# Patient Record
Sex: Male | Born: 1970 | Race: White | Hispanic: No | Marital: Single | Smoking: Former smoker
Health system: Southern US, Community
[De-identification: ages and names within clinical notes are randomized; demographics above are authoritative.]

## PROBLEM LIST (undated history)

## (undated) DIAGNOSIS — E119 Type 2 diabetes mellitus without complications: Secondary | ICD-10-CM

## (undated) DIAGNOSIS — C9140 Hairy cell leukemia not having achieved remission: Secondary | ICD-10-CM

## (undated) DIAGNOSIS — K5792 Diverticulitis of intestine, part unspecified, without perforation or abscess without bleeding: Secondary | ICD-10-CM

## (undated) DIAGNOSIS — I10 Essential (primary) hypertension: Secondary | ICD-10-CM

## (undated) HISTORY — DX: Essential (primary) hypertension: I10

## (undated) HISTORY — DX: Hairy cell leukemia not having achieved remission: C91.40

## (undated) HISTORY — DX: Type 2 diabetes mellitus without complications: E11.9

## (undated) HISTORY — PX: COLON SURGERY: SHX602

---

## 2006-02-11 ENCOUNTER — Ambulatory Visit: Payer: Self-pay | Admitting: Urology

## 2007-06-06 ENCOUNTER — Emergency Department: Payer: Self-pay | Admitting: Emergency Medicine

## 2007-06-06 ENCOUNTER — Other Ambulatory Visit: Payer: Self-pay

## 2008-12-22 ENCOUNTER — Ambulatory Visit: Payer: Self-pay | Admitting: Gastroenterology

## 2009-01-05 ENCOUNTER — Ambulatory Visit: Payer: Self-pay | Admitting: Gastroenterology

## 2009-03-01 ENCOUNTER — Emergency Department: Payer: Self-pay | Admitting: Emergency Medicine

## 2009-03-22 ENCOUNTER — Ambulatory Visit: Payer: Self-pay

## 2009-05-05 ENCOUNTER — Ambulatory Visit: Payer: Self-pay | Admitting: Gastroenterology

## 2009-05-12 ENCOUNTER — Ambulatory Visit: Payer: Self-pay | Admitting: Unknown Physician Specialty

## 2009-05-13 ENCOUNTER — Ambulatory Visit: Payer: Self-pay | Admitting: Unknown Physician Specialty

## 2009-05-25 ENCOUNTER — Ambulatory Visit: Payer: Self-pay | Admitting: Pain Medicine

## 2010-08-02 ENCOUNTER — Ambulatory Visit: Payer: Self-pay | Admitting: Internal Medicine

## 2011-08-30 ENCOUNTER — Emergency Department: Payer: Self-pay | Admitting: Emergency Medicine

## 2011-08-30 LAB — CBC
HCT: 49.5 % (ref 40.0–52.0)
HGB: 16.4 g/dL (ref 13.0–18.0)
MCH: 28.8 pg (ref 26.0–34.0)
MCV: 87 fL (ref 80–100)
Platelet: 262 10*3/uL (ref 150–440)
RBC: 5.67 10*6/uL (ref 4.40–5.90)
RDW: 15.1 % — ABNORMAL HIGH (ref 11.5–14.5)
WBC: 17.3 10*3/uL — ABNORMAL HIGH (ref 3.8–10.6)

## 2011-08-30 LAB — COMPREHENSIVE METABOLIC PANEL
Calcium, Total: 9 mg/dL (ref 8.5–10.1)
Chloride: 99 mmol/L (ref 98–107)
Co2: 25 mmol/L (ref 21–32)
EGFR (African American): >60
EGFR (Non-African Amer.): >60
Glucose: 165 mg/dL — ABNORMAL HIGH (ref 65–99)
Osmolality: 274 (ref 275–301)
Potassium: 2.9 mmol/L — ABNORMAL LOW (ref 3.5–5.1)
SGOT(AST): 12 U/L — ABNORMAL LOW (ref 15–37)
Total Protein: 8.1 g/dL (ref 6.4–8.2)

## 2011-08-30 LAB — URINALYSIS, COMPLETE
Bilirubin,UR: NEGATIVE
Glucose,UR: NEGATIVE mg/dL (ref 0–75)
Hyaline Cast: 3
Ketone: NEGATIVE
Leukocyte Esterase: NEGATIVE
Protein: 100
WBC UR: 5 /HPF (ref 0–5)

## 2012-06-27 ENCOUNTER — Ambulatory Visit: Payer: Self-pay | Admitting: Gastroenterology

## 2013-10-09 ENCOUNTER — Inpatient Hospital Stay: Payer: Self-pay | Admitting: Surgery

## 2013-10-09 LAB — COMPREHENSIVE METABOLIC PANEL
ALBUMIN: 3.2 g/dL — AB (ref 3.4–5.0)
ALK PHOS: 106 U/L
Anion Gap: 8 (ref 7–16)
BILIRUBIN TOTAL: 0.7 mg/dL (ref 0.2–1.0)
BUN: 7 mg/dL (ref 7–18)
CALCIUM: 9.4 mg/dL (ref 8.5–10.1)
CHLORIDE: 98 mmol/L (ref 98–107)
Co2: 29 mmol/L (ref 21–32)
Creatinine: 1.02 mg/dL (ref 0.60–1.30)
GLUCOSE: 115 mg/dL — AB (ref 65–99)
Osmolality: 269 (ref 275–301)
POTASSIUM: 3.2 mmol/L — AB (ref 3.5–5.1)
SGOT(AST): 13 U/L — ABNORMAL LOW (ref 15–37)
SGPT (ALT): 28 U/L
Sodium: 135 mmol/L — ABNORMAL LOW (ref 136–145)
Total Protein: 8.5 g/dL — ABNORMAL HIGH (ref 6.4–8.2)

## 2013-10-09 LAB — URINALYSIS, COMPLETE
BACTERIA: NONE SEEN
BILIRUBIN, UR: NEGATIVE
Glucose,UR: NEGATIVE mg/dL (ref 0–75)
Ketone: NEGATIVE
LEUKOCYTE ESTERASE: NEGATIVE
Nitrite: NEGATIVE
PH: 6 (ref 4.5–8.0)
Protein: NEGATIVE
RBC,UR: <1 /HPF (ref 0–5)
SQUAMOUS EPITHELIAL: NONE SEEN
Specific Gravity: 1.006 (ref 1.003–1.030)

## 2013-10-09 LAB — CBC WITH DIFFERENTIAL/PLATELET
Basophil #: 0.1 10*3/uL (ref 0.0–0.1)
Basophil %: 0.4 %
EOS ABS: 0.2 10*3/uL (ref 0.0–0.7)
Eosinophil %: 1.1 %
HCT: 55.6 % — ABNORMAL HIGH (ref 40.0–52.0)
HGB: 18.3 g/dL — AB (ref 13.0–18.0)
LYMPHS PCT: 9.3 %
Lymphocyte #: 1.6 10*3/uL (ref 1.0–3.6)
MCH: 29.5 pg (ref 26.0–34.0)
MCHC: 32.9 g/dL (ref 32.0–36.0)
MCV: 90 fL (ref 80–100)
Monocyte #: 1.9 x10 3/mm — ABNORMAL HIGH (ref 0.2–1.0)
Monocyte %: 11.3 %
NEUTROS ABS: 13.2 10*3/uL — AB (ref 1.4–6.5)
NEUTROS PCT: 77.9 %
Platelet: 296 10*3/uL (ref 150–440)
RBC: 6.2 10*6/uL — ABNORMAL HIGH (ref 4.40–5.90)
RDW: 13.8 % (ref 11.5–14.5)
WBC: 17 10*3/uL — AB (ref 3.8–10.6)

## 2013-10-09 LAB — PROTIME-INR
INR: 1.2
Prothrombin Time: 15.1 secs — ABNORMAL HIGH (ref 11.5–14.7)

## 2013-10-10 LAB — BASIC METABOLIC PANEL
Anion Gap: 5 — ABNORMAL LOW (ref 7–16)
BUN: 6 mg/dL — ABNORMAL LOW (ref 7–18)
CALCIUM: 8.7 mg/dL (ref 8.5–10.1)
Chloride: 100 mmol/L (ref 98–107)
Co2: 33 mmol/L — ABNORMAL HIGH (ref 21–32)
Creatinine: 1.07 mg/dL (ref 0.60–1.30)
EGFR (African American): >60
EGFR (Non-African Amer.): >60
Glucose: 109 mg/dL — ABNORMAL HIGH (ref 65–99)
OSMOLALITY: 274 (ref 275–301)
POTASSIUM: 3.7 mmol/L (ref 3.5–5.1)
SODIUM: 138 mmol/L (ref 136–145)

## 2013-10-10 LAB — CBC WITH DIFFERENTIAL/PLATELET
Basophil #: 0.2 10*3/uL — ABNORMAL HIGH (ref 0.0–0.1)
Basophil %: 0.9 %
EOS ABS: 0.4 10*3/uL (ref 0.0–0.7)
Eosinophil %: 2.3 %
HCT: 46.4 % (ref 40.0–52.0)
HGB: 15.3 g/dL (ref 13.0–18.0)
Lymphocyte #: 2.5 10*3/uL (ref 1.0–3.6)
Lymphocyte %: 14.9 %
MCH: 29.2 pg (ref 26.0–34.0)
MCHC: 33 g/dL (ref 32.0–36.0)
MCV: 89 fL (ref 80–100)
Monocyte #: 1.9 x10 3/mm — ABNORMAL HIGH (ref 0.2–1.0)
Monocyte %: 11.6 %
NEUTROS PCT: 70.3 %
Neutrophil #: 11.8 10*3/uL — ABNORMAL HIGH (ref 1.4–6.5)
PLATELETS: 274 10*3/uL (ref 150–440)
RBC: 5.23 10*6/uL (ref 4.40–5.90)
RDW: 13.6 % (ref 11.5–14.5)
WBC: 16.7 10*3/uL — ABNORMAL HIGH (ref 3.8–10.6)

## 2013-10-10 LAB — URINE CULTURE

## 2013-10-11 LAB — BASIC METABOLIC PANEL
ANION GAP: 5 — AB (ref 7–16)
BUN: 6 mg/dL — ABNORMAL LOW (ref 7–18)
CALCIUM: 9.2 mg/dL (ref 8.5–10.1)
CHLORIDE: 98 mmol/L (ref 98–107)
CO2: 30 mmol/L (ref 21–32)
Creatinine: 0.77 mg/dL (ref 0.60–1.30)
EGFR (African American): >60
EGFR (Non-African Amer.): >60
Glucose: 164 mg/dL — ABNORMAL HIGH (ref 65–99)
OSMOLALITY: 268 (ref 275–301)
POTASSIUM: 4.6 mmol/L (ref 3.5–5.1)
Sodium: 133 mmol/L — ABNORMAL LOW (ref 136–145)

## 2013-10-11 LAB — CBC WITH DIFFERENTIAL/PLATELET
BASOS ABS: 0.1 10*3/uL (ref 0.0–0.1)
Basophil %: 0.3 %
EOS PCT: 0.2 %
Eosinophil #: 0 10*3/uL (ref 0.0–0.7)
HCT: 47.3 % (ref 40.0–52.0)
HGB: 15.4 g/dL (ref 13.0–18.0)
Lymphocyte #: 1 10*3/uL (ref 1.0–3.6)
Lymphocyte %: 4.1 %
MCH: 29 pg (ref 26.0–34.0)
MCHC: 32.6 g/dL (ref 32.0–36.0)
MCV: 89 fL (ref 80–100)
MONOS PCT: 9.5 %
Monocyte #: 2.4 x10 3/mm — ABNORMAL HIGH (ref 0.2–1.0)
NEUTROS ABS: 21.7 10*3/uL — AB (ref 1.4–6.5)
Neutrophil %: 85.9 %
PLATELETS: 349 10*3/uL (ref 150–440)
RBC: 5.32 10*6/uL (ref 4.40–5.90)
RDW: 13.4 % (ref 11.5–14.5)
WBC: 25.3 10*3/uL — ABNORMAL HIGH (ref 3.8–10.6)

## 2013-10-14 LAB — CULTURE, BLOOD (SINGLE)

## 2013-10-14 LAB — PATHOLOGY REPORT

## 2013-10-15 LAB — CREATININE, SERUM
Creatinine: 0.93 mg/dL (ref 0.60–1.30)
EGFR (African American): >60

## 2013-10-15 LAB — WOUND CULTURE

## 2014-02-03 ENCOUNTER — Ambulatory Visit: Payer: Self-pay | Admitting: Surgery

## 2014-02-08 ENCOUNTER — Ambulatory Visit: Payer: Self-pay | Admitting: Surgery

## 2014-04-02 ENCOUNTER — Inpatient Hospital Stay: Payer: Self-pay | Admitting: Surgery

## 2014-04-02 LAB — CREATININE, SERUM
Creatinine: 1.27 mg/dL (ref 0.60–1.30)
EGFR (African American): >60
EGFR (Non-African Amer.): >60

## 2014-04-03 LAB — CBC WITH DIFFERENTIAL/PLATELET
Basophil #: 0.1 10*3/uL (ref 0.0–0.1)
Basophil %: 0.2 %
Eosinophil #: 0 10*3/uL (ref 0.0–0.7)
Eosinophil %: 0 %
HCT: 58.9 % — ABNORMAL HIGH (ref 40.0–52.0)
HGB: 19.2 g/dL — ABNORMAL HIGH (ref 13.0–18.0)
Lymphocyte #: 1.3 10*3/uL (ref 1.0–3.6)
Lymphocyte %: 4.7 %
MCH: 28.4 pg (ref 26.0–34.0)
MCHC: 32.5 g/dL (ref 32.0–36.0)
MCV: 87 fL (ref 80–100)
MONOS PCT: 9.3 %
Monocyte #: 2.5 x10 3/mm — ABNORMAL HIGH (ref 0.2–1.0)
NEUTROS PCT: 85.8 %
Neutrophil #: 23.3 10*3/uL — ABNORMAL HIGH (ref 1.4–6.5)
PLATELETS: 220 10*3/uL (ref 150–440)
RBC: 6.75 10*6/uL — ABNORMAL HIGH (ref 4.40–5.90)
RDW: 14.8 % — ABNORMAL HIGH (ref 11.5–14.5)
WBC: 27.1 10*3/uL — AB (ref 3.8–10.6)

## 2014-04-03 LAB — BASIC METABOLIC PANEL
Anion Gap: 7 (ref 7–16)
BUN: 10 mg/dL (ref 7–18)
CREATININE: 0.95 mg/dL (ref 0.60–1.30)
Calcium, Total: 8.8 mg/dL (ref 8.5–10.1)
Chloride: 103 mmol/L (ref 98–107)
Co2: 27 mmol/L (ref 21–32)
EGFR (Non-African Amer.): >60
Glucose: 160 mg/dL — ABNORMAL HIGH (ref 65–99)
OSMOLALITY: 276 (ref 275–301)
Potassium: 4.2 mmol/L (ref 3.5–5.1)
Sodium: 137 mmol/L (ref 136–145)

## 2014-04-04 LAB — CBC WITH DIFFERENTIAL/PLATELET
BASOS ABS: 0.1 10*3/uL (ref 0.0–0.1)
Basophil %: 0.2 %
Eosinophil #: 0.1 10*3/uL (ref 0.0–0.7)
Eosinophil %: 0.4 %
HCT: 55.3 % — ABNORMAL HIGH (ref 40.0–52.0)
HGB: 17.6 g/dL (ref 13.0–18.0)
LYMPHS PCT: 6.5 %
Lymphocyte #: 1.5 10*3/uL (ref 1.0–3.6)
MCH: 28.2 pg (ref 26.0–34.0)
MCHC: 31.8 g/dL — AB (ref 32.0–36.0)
MCV: 89 fL (ref 80–100)
Monocyte #: 2.4 x10 3/mm — ABNORMAL HIGH (ref 0.2–1.0)
Monocyte %: 10.4 %
Neutrophil #: 19.5 10*3/uL — ABNORMAL HIGH (ref 1.4–6.5)
Neutrophil %: 82.5 %
Platelet: 174 10*3/uL (ref 150–440)
RBC: 6.24 10*6/uL — ABNORMAL HIGH (ref 4.40–5.90)
RDW: 14.9 % — ABNORMAL HIGH (ref 11.5–14.5)
WBC: 23.6 10*3/uL — ABNORMAL HIGH (ref 3.8–10.6)

## 2014-04-04 LAB — BASIC METABOLIC PANEL
Anion Gap: 7 (ref 7–16)
BUN: 10 mg/dL (ref 7–18)
CALCIUM: 8.7 mg/dL (ref 8.5–10.1)
Chloride: 101 mmol/L (ref 98–107)
Co2: 28 mmol/L (ref 21–32)
Creatinine: 0.77 mg/dL (ref 0.60–1.30)
EGFR (Non-African Amer.): >60
GLUCOSE: 145 mg/dL — AB (ref 65–99)
OSMOLALITY: 274 (ref 275–301)
POTASSIUM: 3.9 mmol/L (ref 3.5–5.1)
Sodium: 136 mmol/L (ref 136–145)

## 2014-04-05 LAB — BASIC METABOLIC PANEL
Anion Gap: 7 (ref 7–16)
BUN: 11 mg/dL (ref 7–18)
CO2: 29 mmol/L (ref 21–32)
Calcium, Total: 8.6 mg/dL (ref 8.5–10.1)
Chloride: 99 mmol/L (ref 98–107)
Creatinine: 0.83 mg/dL (ref 0.60–1.30)
EGFR (African American): >60
EGFR (Non-African Amer.): >60
Glucose: 119 mg/dL — ABNORMAL HIGH (ref 65–99)
Osmolality: 271 (ref 275–301)
Potassium: 3.7 mmol/L (ref 3.5–5.1)
SODIUM: 135 mmol/L — AB (ref 136–145)

## 2014-04-05 LAB — CBC WITH DIFFERENTIAL/PLATELET
BASOS PCT: 0.5 %
Basophil #: 0.1 10*3/uL (ref 0.0–0.1)
EOS ABS: 0.4 10*3/uL (ref 0.0–0.7)
EOS PCT: 2 %
HCT: 53.6 % — ABNORMAL HIGH (ref 40.0–52.0)
HGB: 17.5 g/dL (ref 13.0–18.0)
Lymphocyte #: 2.3 10*3/uL (ref 1.0–3.6)
Lymphocyte %: 11.7 %
MCH: 28.6 pg (ref 26.0–34.0)
MCHC: 32.6 g/dL (ref 32.0–36.0)
MCV: 88 fL (ref 80–100)
Monocyte #: 2.1 x10 3/mm — ABNORMAL HIGH (ref 0.2–1.0)
Monocyte %: 10.8 %
NEUTROS PCT: 75 %
Neutrophil #: 14.7 10*3/uL — ABNORMAL HIGH (ref 1.4–6.5)
Platelet: 195 10*3/uL (ref 150–440)
RBC: 6.11 10*6/uL — AB (ref 4.40–5.90)
RDW: 15 % — ABNORMAL HIGH (ref 11.5–14.5)
WBC: 19.6 10*3/uL — ABNORMAL HIGH (ref 3.8–10.6)

## 2014-04-06 LAB — BASIC METABOLIC PANEL
ANION GAP: 6 — AB (ref 7–16)
BUN: 14 mg/dL (ref 7–18)
CALCIUM: 8.5 mg/dL (ref 8.5–10.1)
CO2: 30 mmol/L (ref 21–32)
Chloride: 97 mmol/L — ABNORMAL LOW (ref 98–107)
Creatinine: 0.95 mg/dL (ref 0.60–1.30)
EGFR (Non-African Amer.): >60
Glucose: 111 mg/dL — ABNORMAL HIGH (ref 65–99)
Osmolality: 268 (ref 275–301)
Potassium: 3.6 mmol/L (ref 3.5–5.1)
Sodium: 133 mmol/L — ABNORMAL LOW (ref 136–145)

## 2014-04-06 LAB — CBC WITH DIFFERENTIAL/PLATELET
BASOS ABS: 0.1 10*3/uL (ref 0.0–0.1)
Basophil %: 0.7 %
Eosinophil #: 0.6 10*3/uL (ref 0.0–0.7)
Eosinophil %: 4.5 %
HCT: 52.9 % — ABNORMAL HIGH (ref 40.0–52.0)
HGB: 17.2 g/dL (ref 13.0–18.0)
LYMPHS ABS: 2 10*3/uL (ref 1.0–3.6)
Lymphocyte %: 14.9 %
MCH: 28.8 pg (ref 26.0–34.0)
MCHC: 32.5 g/dL (ref 32.0–36.0)
MCV: 89 fL (ref 80–100)
MONO ABS: 1.5 x10 3/mm — AB (ref 0.2–1.0)
MONOS PCT: 11.4 %
Neutrophil #: 9 10*3/uL — ABNORMAL HIGH (ref 1.4–6.5)
Neutrophil %: 68.5 %
Platelet: 214 10*3/uL (ref 150–440)
RBC: 5.96 10*6/uL — ABNORMAL HIGH (ref 4.40–5.90)
RDW: 14.7 % — ABNORMAL HIGH (ref 11.5–14.5)
WBC: 13.2 10*3/uL — ABNORMAL HIGH (ref 3.8–10.6)

## 2014-05-12 ENCOUNTER — Inpatient Hospital Stay: Payer: Self-pay | Admitting: Surgery

## 2014-07-04 ENCOUNTER — Inpatient Hospital Stay
Admit: 2014-07-04 | Disposition: A | Discharge status: Home / Self Care | Payer: Self-pay | Attending: Surgery | Admitting: Surgery

## 2014-07-04 LAB — CBC
HCT: 56.1 % — ABNORMAL HIGH (ref 40.0–52.0)
HGB: 18.5 g/dL — ABNORMAL HIGH (ref 13.0–18.0)
MCH: 29.7 pg (ref 26.0–34.0)
MCHC: 33 g/dL (ref 32.0–36.0)
MCV: 90 fL (ref 80–100)
Platelet: 259 10*3/uL (ref 150–440)
RBC: 6.22 10*6/uL — AB (ref 4.40–5.90)
RDW: 15.5 % — ABNORMAL HIGH (ref 11.5–14.5)
WBC: 13.3 10*3/uL — AB (ref 3.8–10.6)

## 2014-07-04 LAB — COMPREHENSIVE METABOLIC PANEL
ALT: 26 U/L
ANION GAP: 10 (ref 7–16)
Albumin: 4.2 g/dL
Alkaline Phosphatase: 73 U/L
BUN: 11 mg/dL
Bilirubin,Total: 0.5 mg/dL
CO2: 28 mmol/L
Calcium, Total: 9.3 mg/dL
Chloride: 102 mmol/L
Creatinine: 0.91 mg/dL
EGFR (African American): >60
Glucose: 132 mg/dL — ABNORMAL HIGH
Potassium: 3.8 mmol/L
SGOT(AST): 28 U/L
SODIUM: 140 mmol/L
TOTAL PROTEIN: 7.7 g/dL

## 2014-07-05 LAB — CBC WITH DIFFERENTIAL/PLATELET
BASOS ABS: 0 10*3/uL (ref 0.0–0.1)
BASOS PCT: 0.1 %
EOS ABS: 0 10*3/uL (ref 0.0–0.7)
Eosinophil %: 0.1 %
HCT: 59 % — ABNORMAL HIGH (ref 40.0–52.0)
HGB: 18.9 g/dL — AB (ref 13.0–18.0)
LYMPHS PCT: 4.3 %
Lymphocyte #: 0.6 10*3/uL — ABNORMAL LOW (ref 1.0–3.6)
MCH: 29.3 pg (ref 26.0–34.0)
MCHC: 32 g/dL (ref 32.0–36.0)
MCV: 92 fL (ref 80–100)
MONOS PCT: 4.4 %
Monocyte #: 0.7 x10 3/mm (ref 0.2–1.0)
Neutrophil #: 13.7 10*3/uL — ABNORMAL HIGH (ref 1.4–6.5)
Neutrophil %: 91.1 %
Platelet: 234 10*3/uL (ref 150–440)
RBC: 6.45 10*6/uL — ABNORMAL HIGH (ref 4.40–5.90)
RDW: 15.9 % — ABNORMAL HIGH (ref 11.5–14.5)
WBC: 15.1 10*3/uL — ABNORMAL HIGH (ref 3.8–10.6)

## 2014-07-05 LAB — URINALYSIS, COMPLETE
BILIRUBIN, UR: NEGATIVE
BLOOD: NEGATIVE
Bacteria: NONE SEEN
Glucose,UR: NEGATIVE mg/dL (ref 0–75)
Ketone: NEGATIVE
Leukocyte Esterase: NEGATIVE
NITRITE: NEGATIVE
PROTEIN: NEGATIVE
Ph: 5 (ref 4.5–8.0)
SPECIFIC GRAVITY: 1.047 (ref 1.003–1.030)
Squamous Epithelial: NONE SEEN

## 2014-07-05 LAB — BASIC METABOLIC PANEL
Anion Gap: 10 (ref 7–16)
BUN: 12 mg/dL
CHLORIDE: 105 mmol/L
CO2: 24 mmol/L
Calcium, Total: 8.3 mg/dL — ABNORMAL LOW
Creatinine: 1.07 mg/dL
Glucose: 172 mg/dL — ABNORMAL HIGH
POTASSIUM: 4.3 mmol/L
Sodium: 139 mmol/L

## 2014-07-06 LAB — BASIC METABOLIC PANEL
Anion Gap: 8 (ref 7–16)
BUN: 20 mg/dL
CHLORIDE: 105 mmol/L
CREATININE: 0.99 mg/dL
Calcium, Total: 8.1 mg/dL — ABNORMAL LOW
Co2: 24 mmol/L
EGFR (Non-African Amer.): >60
Glucose: 151 mg/dL — ABNORMAL HIGH
Potassium: 4.3 mmol/L
Sodium: 137 mmol/L

## 2014-07-06 LAB — CBC WITH DIFFERENTIAL/PLATELET
Basophil #: 0.1 10*3/uL (ref 0.0–0.1)
Basophil %: 0.2 %
EOS ABS: 0 10*3/uL (ref 0.0–0.7)
Eosinophil %: 0 %
HCT: 54.9 % — ABNORMAL HIGH (ref 40.0–52.0)
HGB: 18 g/dL (ref 13.0–18.0)
LYMPHS ABS: 0.7 10*3/uL — AB (ref 1.0–3.6)
Lymphocyte %: 2.9 %
MCH: 29.9 pg (ref 26.0–34.0)
MCHC: 32.8 g/dL (ref 32.0–36.0)
MCV: 91 fL (ref 80–100)
MONOS PCT: 5 %
Monocyte #: 1.2 x10 3/mm — ABNORMAL HIGH (ref 0.2–1.0)
Neutrophil #: 21.7 10*3/uL — ABNORMAL HIGH (ref 1.4–6.5)
Neutrophil %: 91.9 %
PLATELETS: 210 10*3/uL (ref 150–440)
RBC: 6.01 10*6/uL — ABNORMAL HIGH (ref 4.40–5.90)
RDW: 15.8 % — AB (ref 11.5–14.5)
WBC: 23.7 10*3/uL — ABNORMAL HIGH (ref 3.8–10.6)

## 2014-07-07 LAB — CBC WITH DIFFERENTIAL/PLATELET
BASOS ABS: 0 10*3/uL (ref 0.0–0.1)
Basophil %: 0 %
EOS ABS: 0 10*3/uL (ref 0.0–0.7)
Eosinophil %: 0 %
HCT: 44 % (ref 40.0–52.0)
HGB: 14 g/dL (ref 13.0–18.0)
LYMPHS ABS: 0.4 10*3/uL — AB (ref 1.0–3.6)
LYMPHS PCT: 2.5 %
MCH: 29.5 pg (ref 26.0–34.0)
MCHC: 32 g/dL (ref 32.0–36.0)
MCV: 92 fL (ref 80–100)
Monocyte #: 0.8 x10 3/mm (ref 0.2–1.0)
Monocyte %: 4.2 %
Neutrophil #: 16.9 10*3/uL — ABNORMAL HIGH (ref 1.4–6.5)
Neutrophil %: 93.3 %
Platelet: 179 10*3/uL (ref 150–440)
RBC: 4.77 10*6/uL (ref 4.40–5.90)
RDW: 15.3 % — AB (ref 11.5–14.5)
WBC: 18.1 10*3/uL — ABNORMAL HIGH (ref 3.8–10.6)

## 2014-07-07 LAB — BASIC METABOLIC PANEL
Anion Gap: 2 — ABNORMAL LOW (ref 7–16)
BUN: 17 mg/dL
CALCIUM: 7.4 mg/dL — AB
CO2: 28 mmol/L
CREATININE: 1.03 mg/dL
Chloride: 106 mmol/L
EGFR (African American): >60
EGFR (Non-African Amer.): >60
GLUCOSE: 162 mg/dL — AB
Potassium: 4.4 mmol/L
Sodium: 136 mmol/L

## 2014-07-08 LAB — TPN PANEL
Activated PTT: 26 secs (ref 23.6–35.9)
Albumin: 2.4 g/dL — ABNORMAL LOW
Alkaline Phosphatase: 63 U/L
Anion Gap: 4 — ABNORMAL LOW (ref 7–16)
BUN: 16 mg/dL
CALCIUM: 7.9 mg/dL — AB
Chloride: 102 mmol/L
Cholesterol: 163 mg/dL
Co2: 32 mmol/L
Creatinine: 0.78 mg/dL
EGFR (African American): >60
EGFR (Non-African Amer.): >60
Glucose: 143 mg/dL — ABNORMAL HIGH
HGB: 13.9 g/dL (ref 13.0–18.0)
INR: 1.1
MAGNESIUM: 2.3 mg/dL
PROTHROMBIN TIME: 14 s
Phosphorus: 1.4 mg/dL — ABNORMAL LOW
Platelet: 190 10*3/uL (ref 150–440)
Potassium: 3.9 mmol/L
SGOT(AST): 14 U/L — ABNORMAL LOW
Sodium: 138 mmol/L
Total Protein: 6.1 g/dL — ABNORMAL LOW
Triglycerides: 191 mg/dL — ABNORMAL HIGH
WBC: 19.4 10*3/uL — ABNORMAL HIGH (ref 3.8–10.6)

## 2014-07-09 LAB — PHOSPHORUS: Phosphorus: 2.3 mg/dL — ABNORMAL LOW

## 2014-07-09 LAB — BASIC METABOLIC PANEL
Anion Gap: 6 — ABNORMAL LOW (ref 7–16)
BUN: 15 mg/dL
CHLORIDE: 101 mmol/L
CREATININE: 0.62 mg/dL
Calcium, Total: 8.2 mg/dL — ABNORMAL LOW
Co2: 30 mmol/L
EGFR (African American): >60
EGFR (Non-African Amer.): >60
GLUCOSE: 113 mg/dL — AB
Potassium: 3.7 mmol/L
Sodium: 137 mmol/L

## 2014-07-09 LAB — MAGNESIUM: Magnesium: 2.3 mg/dL

## 2014-07-10 LAB — CBC WITH DIFFERENTIAL/PLATELET
BASOS ABS: 0.1 10*3/uL (ref 0.0–0.1)
BASOS PCT: 0.3 %
EOS ABS: 0.1 10*3/uL (ref 0.0–0.7)
EOS PCT: 0.7 %
HCT: 49.8 % (ref 40.0–52.0)
HGB: 16.6 g/dL (ref 13.0–18.0)
LYMPHS PCT: 6.2 %
Lymphocyte #: 1.2 10*3/uL (ref 1.0–3.6)
MCH: 29.9 pg (ref 26.0–34.0)
MCHC: 33.3 g/dL (ref 32.0–36.0)
MCV: 90 fL (ref 80–100)
MONO ABS: 2 x10 3/mm — AB (ref 0.2–1.0)
Monocyte %: 10 %
NEUTROS ABS: 16.5 10*3/uL — AB (ref 1.4–6.5)
NEUTROS PCT: 82.8 %
Platelet: 224 10*3/uL (ref 150–440)
RBC: 5.56 10*6/uL (ref 4.40–5.90)
RDW: 15.1 % — ABNORMAL HIGH (ref 11.5–14.5)
WBC: 19.9 10*3/uL — ABNORMAL HIGH (ref 3.8–10.6)

## 2014-07-10 LAB — BASIC METABOLIC PANEL
Anion Gap: 10 (ref 7–16)
BUN: 17 mg/dL
CALCIUM: 8.4 mg/dL — AB
CHLORIDE: 96 mmol/L — AB
Co2: 29 mmol/L
Creatinine: 0.59 mg/dL — ABNORMAL LOW
EGFR (Non-African Amer.): >60
Glucose: 128 mg/dL — ABNORMAL HIGH
Potassium: 3.6 mmol/L
Sodium: 135 mmol/L

## 2014-07-10 LAB — MAGNESIUM: MAGNESIUM: 2.3 mg/dL

## 2014-07-10 LAB — PHOSPHORUS: Phosphorus: 3.3 mg/dL

## 2014-07-10 NOTE — Op Note (Signed)
PATIENT NAME:  Roy Valentine, Roy Valentine MR#:  [id number] DATE OF BIRTH:  07-12-1970  DATE OF PROCEDURE:  10/10/2013  PREOPERATIVE DIAGNOSIS: Perforated diverticulitis with pelvic abscess.   POSTOPERATIVE DIAGNOSIS: Perforated diverticulitis with abdominal abscess.   SURGERY: Exploratory laparotomy,  Hartmann procedure, and central line placement.   ANESTHESIA: General.   SURGEON: Micheline Maze, M.D.    OPERATIVE PROCEDURE: With the patient in the supine position, and after induction of appropriate general anesthesia and placement of a Foley catheter, the patient's abdomen was prepped with ChloraPrep and draped with sterile towels. An alcohol wipe and Betadine impregnated Steri-Drapes were utilized. A midline incision was made from just above the umbilicus to the suprapubic area and carried down through the subcutaneous tissue with Bovie electrocautery. The midline fascia was identified and this was opened the length of the skin incision, as was the peritoneum. There was a large, firm mass in the lower midline where the omentum was sealed to the dome of the bladder. This area was taken down with a combination of blunt and Bovie dissection. A large amount of purulence was identified and suctioned without difficulty. It was cultured. The omentum was slowly peeled back, as were multiple loops of small bowel. There did not appear to be any small bowel involvement, although the small bowel was markedly edematous and adherent to the abscessed mass. The small bowel was then packed out of the pelvis. The sigmoid colon was markedly edematous, inflamed, and obviously involved in an acute process suspected to be diverticulitis. There did appear to be some distal sigmoid, which appeared to be normal. It was divided with the Contour stapling device. The mesentery taken down with the LigaSure staying close to the bowel wall. The splenic flexure was mobilized to the mid transverse colon using the LigaSure device in  order to provide adequate length, as there did appear to be almost the entirety of the sigmoid colon involved in the diverticular process. The bowel was divided proximally with the Contour device and the specimen passed off the table.   The area was copiously irrigated. Warm saline solution was used to 2 liters. The bowel contents were then examined. There did not appear to be any evidence of small bowel problem other than some edema from the abscess formation from the ligament of Treitz to the ileocecal valve. A spot was chosen on the anterior abdominal wall for the colostomy. The skin was incised in a circular fashion. A cruciate incision made in both the peritoneum and the anterior and posterior fascia. The bowel was manipulated through the abdominal wall without difficulty. The fascia was then closed with a running #1 looped PDS suture from each end, tying in the middle and burying the knot. The colostomy was matured with 3-0 and 4-0 Vicryl. A wound VAC was placed, as was a colostomy appliance. Gown and gloves were changed. A right internal jugular vein line was inserted on a single pass under direct vision using the ultrasound device. It was sutured in place, flushed and sterilely dressed.   The patient then returned to the recovery room, having tolerated the procedure well.   Sponge, instrument and needle counts were correct x2 in the operating room.   ____________________________ Rodena Goldmann III, MD rle:jr D: 10/10/2013 10:55:00 ET T: 10/10/2013 13:41:01 ET JOB#: [id number]  cc: Rodena Goldmann III, MD, <Dictator> Rodena Goldmann MD ELECTRONICALLY SIGNED 10/19/2013 21:23

## 2014-07-10 NOTE — Discharge Summary (Signed)
PATIENT NAME:  OTHNIEL, Roy Valentine MR#:  [id number] DATE OF BIRTH:  Jan 11, 1971  DATE OF ADMISSION:  10/09/2013 DATE OF DISCHARGE:  10/16/2013   BRIEF HISTORY: Rommie Dunn is a 44 year old gentleman seen in the Emergency Room with a several-day history of abdominal pain. He had had multiple previous episodes of sigmoid diverticulitis, but had not had a previous perforation. The current evaluation demonstrated 2 large pelvic and intraperitoneal abscesses, in addition to a significantly inflamed portion of sigmoid colon. White blood cell count was elevated. We admitted him to the hospital, placed him on IV antibiotics and recommend surgical intervention. He agreed with the planned procedure. He was taken to surgery on the morning of July 25 where he underwent exploratory laparotomy, Hartmann resection, and central line placement. He had a very rapid return of bowel function and improved dramatically post surgery. He was discharged home on the 31st to be followed in the office in 7 to 10 days' time.   Bathing, activity, and driving instructions were given to the patient. He was discharged home on hydrocodone 5/325 every 4-6 hours p.r.n. pain.   FINAL DISCHARGE DIAGNOSIS: Perforated sigmoid diverticulitis with abscess.   SURGERY: Jeanette Caprice procedure.    ____________________________ Micheline Maze, MD rle:ms D: 10/20/2013 22:55:30 ET T: 10/21/2013 05:40:49 ET JOB#: [id number]  cc: Rodena Goldmann III, MD, <Dictator> Tracie Harrier, MD Rodena Goldmann MD ELECTRONICALLY SIGNED 10/21/2013 19:17

## 2014-07-11 LAB — BASIC METABOLIC PANEL
ANION GAP: 5 — AB (ref 7–16)
BUN: 18 mg/dL
CREATININE: 0.57 mg/dL — AB
Calcium, Total: 8.3 mg/dL — ABNORMAL LOW
Chloride: 99 mmol/L — ABNORMAL LOW
Co2: 28 mmol/L
EGFR (Non-African Amer.): >60
GLUCOSE: 119 mg/dL — AB
POTASSIUM: 3.9 mmol/L
SODIUM: 132 mmol/L — AB

## 2014-07-12 LAB — BASIC METABOLIC PANEL
ANION GAP: 6 — AB (ref 7–16)
BUN: 17 mg/dL
CO2: 28 mmol/L
CREATININE: 0.67 mg/dL
Calcium, Total: 8.4 mg/dL — ABNORMAL LOW
Chloride: 98 mmol/L — ABNORMAL LOW
EGFR (Non-African Amer.): >60
Glucose: 127 mg/dL — ABNORMAL HIGH
Potassium: 4.2 mmol/L
Sodium: 132 mmol/L — ABNORMAL LOW

## 2014-07-12 LAB — CBC WITH DIFFERENTIAL/PLATELET
BASOS PCT: 0.6 %
Basophil #: 0.1 10*3/uL (ref 0.0–0.1)
EOS PCT: 2 %
Eosinophil #: 0.4 10*3/uL (ref 0.0–0.7)
HCT: 47.4 % (ref 40.0–52.0)
HGB: 15.9 g/dL (ref 13.0–18.0)
LYMPHS PCT: 8.2 %
Lymphocyte #: 1.7 10*3/uL (ref 1.0–3.6)
MCH: 29.9 pg (ref 26.0–34.0)
MCHC: 33.5 g/dL (ref 32.0–36.0)
MCV: 89 fL (ref 80–100)
MONO ABS: 1.7 x10 3/mm — AB (ref 0.2–1.0)
MONOS PCT: 8.3 %
NEUTROS PCT: 80.9 %
Neutrophil #: 17 10*3/uL — ABNORMAL HIGH (ref 1.4–6.5)
PLATELETS: 230 10*3/uL (ref 150–440)
RBC: 5.32 10*6/uL (ref 4.40–5.90)
RDW: 15 % — ABNORMAL HIGH (ref 11.5–14.5)
WBC: 21 10*3/uL — ABNORMAL HIGH (ref 3.8–10.6)

## 2014-07-12 LAB — SURGICAL PATHOLOGY

## 2014-07-13 LAB — MAGNESIUM: Magnesium: 2 mg/dL

## 2014-07-13 LAB — PHOSPHORUS: Phosphorus: 2.9 mg/dL

## 2014-07-14 LAB — BASIC METABOLIC PANEL
Anion Gap: 7 (ref 7–16)
Anion Gap: 6 — ABNORMAL LOW (ref 7–16)
BUN: 16 mg/dL
BUN: 14 mg/dL
CALCIUM: 8.5 mg/dL — AB
CALCIUM: 8.6 mg/dL — AB
CREATININE: 0.71 mg/dL
Chloride: 95 mmol/L — ABNORMAL LOW
Chloride: 96 mmol/L — ABNORMAL LOW
Co2: 29 mmol/L
Co2: 30 mmol/L
Creatinine: 0.62 mg/dL
EGFR (African American): >60
EGFR (Non-African Amer.): >60
GLUCOSE: 174 mg/dL — AB
GLUCOSE: 136 mg/dL — AB
Potassium: 4.2 mmol/L
Potassium: 4.2 mmol/L
SODIUM: 131 mmol/L — AB
Sodium: 132 mmol/L — ABNORMAL LOW

## 2014-07-14 LAB — PHOSPHORUS: Phosphorus: 3.3 mg/dL

## 2014-07-14 LAB — MAGNESIUM: Magnesium: 2.1 mg/dL

## 2014-07-15 LAB — MAGNESIUM: MAGNESIUM: 2.1 mg/dL

## 2014-07-15 LAB — BASIC METABOLIC PANEL
ANION GAP: 7 (ref 7–16)
BUN: 15 mg/dL
CREATININE: 0.72 mg/dL
Calcium, Total: 8.5 mg/dL — ABNORMAL LOW
Chloride: 96 mmol/L — ABNORMAL LOW
Co2: 28 mmol/L
EGFR (Non-African Amer.): >60
Glucose: 128 mg/dL — ABNORMAL HIGH
Potassium: 4 mmol/L
SODIUM: 131 mmol/L — AB

## 2014-07-15 LAB — PHOSPHORUS: PHOSPHORUS: 3.7 mg/dL

## 2014-07-16 LAB — BASIC METABOLIC PANEL
Anion Gap: 8 (ref 7–16)
BUN: 15 mg/dL
CALCIUM: 8.9 mg/dL
CHLORIDE: 97 mmol/L — AB
CREATININE: 0.76 mg/dL
Co2: 29 mmol/L
EGFR (African American): >60
EGFR (Non-African Amer.): >60
Glucose: 98 mg/dL
Potassium: 4.2 mmol/L
Sodium: 134 mmol/L — ABNORMAL LOW

## 2014-07-16 LAB — PHOSPHORUS: Phosphorus: 4.3 mg/dL

## 2014-07-16 LAB — MAGNESIUM: Magnesium: 2 mg/dL

## 2014-07-18 NOTE — Discharge Summary (Signed)
PATIENT NAME:  Roy Valentine, KONDRACKI MR#:  [id number] DATE OF BIRTH:  January 19, 1971  DATE OF ADMISSION:  04/02/2014 DATE OF DISCHARGE:  04/06/2014  BRIEF HISTORY: Aydian Dimmick is a 44 year old gentleman who underwent a Hartman procedure in July for perforated diverticular abscess. He was readmitted with the plan of pursuing an ostomy closure. He was taken to surgery on the morning of January 15 after normal preoperative preparation and informed consent, Robotic-assisted procedure was performed laparoscopically in order to takedown the adhesions in his abdomen. He had significant intra-abdominal adhesions which required extensive lysis in order to provide access to the abdomen. The ostomy site was easily identified, but the rectal remnant could not be visualized laparoscopically. The abdomen was then converted to an open procedure. The rectal segment was identified without difficulty. However, after taking down the ostomy and mobilizing to the left colon and splenic flexure, I could not pass the dilator into the rectal remnant. The area was quite stenotic. We then performed a handsewn open anastomosis between the sigmoid colon and the rectal segment. A 2-layer anastomosis was performed. However, because of the depth of the procedure in the pelvis, I elected to perform a diverting ileostomy. That procedure was performed without difficulty using the previous ostomy site. He had very slow return of bowel function and difficulty with pain control initially, but continued to improve over the next 48 hours. He was discharged home on the 19th, to be followed in the office in 7 to 10 days' time. Bathing, activity, and driving instructions were given to the patient.   DISCHARGE MEDICATIONS: Include loperamide 2 mg every 4 hours p.r.n., acetaminophen/oxycodone 5/325 every 4 hours p.r.n., aspirin 81 mg p.o. daily.   FINAL DISCHARGE DIAGNOSIS: Diverticular abscess.   SURGERY: Colostomy closure, reversal of Hartman  procedure with diverting ileostomy.     ____________________________ Rodena Goldmann III, MD rle:TT D: 04/12/2014 22:22:10 ET T: 04/12/2014 22:39:46 ET JOB#: [id number]  cc: Rodena Goldmann III, MD, <Dictator> Rodena Goldmann MD ELECTRONICALLY SIGNED 04/14/2014 22:01

## 2014-07-18 NOTE — Op Note (Signed)
PATIENT NAME:  Roy Valentine, Roy Valentine MR#:  [id number] DATE OF BIRTH:  1970-10-03  DATE OF PROCEDURE:  05/12/2014  PREOPERATIVE DIAGNOSIS: Diverticulitis with resection and ileostomy.   POSTOPERATIVE DIAGNOSIS: Diverticulitis with resection and ileostomy.  OPERATION: Ileostomy closure.   SURGEON: Rodena Goldmann III, MD   ANESTHESIA: General.   OPERATIVE PROCEDURE: With the patient in the supine position after the induction of appropriate general anesthesia, the patient's abdomen was prepped with ChloraPrep and draped with sterile towels. An elliptical incision was made around the ostomy and carried down through the subcutaneous space using Bovie electrocautery. Anterior fascia was identified and the ostomy exit site from the abdominal cavity visualized. The intraperitoneal cavity was entered and then the ostomy detached from the surrounding muscle and fascia. The ostomy was then elevated and incisional adhesions taken with blunt dissection. The 2 limbs of bowel were divided with a GIA-55 stapling device. The mesenteric defect divided with clamps and 2-0 Vicryl ties. The specimen was side off the table. The 2 limbs were now placed side by side. Posterior row of interrupted 3-0 seromuscular sutures were placed. The staple lines were removed and a running through and through suture of 3-0 Vicryl was utilized to create the internal layer. Then on the anterior surface 3-0 silk seromuscular sutures were placed. The repair appeared to be satisfactory. The lumen was palpated. The mesenteric defect was closed with 3-0 Vicryl. Bowel contents returned to their anatomic position. The abdomen was copiously irrigated with warm saline solution. The fascia was mobilized and closed longitudinally using interrupted figure-of-eight sutures of 0 Maxon. A Penrose drain was placed in the bed of the defect and the dead space obliterated with 0 Vicryl. Skin was closed with 3-0 nylon in vertical mattress fashion. The drain was  secured with 3-0 nylon. Sterile dressing was applied. The patient was returned to the recovery room, having tolerated the procedure well. Sponge, instrument and needle counts were correct x 2 in the operating room.    ____________________________ Rodena Goldmann III, MD rle:bm D: 05/12/2014 15:24:51 ET T: 05/13/2014 02:46:29 ET JOB#: [id number]  cc: Rodena Goldmann III, MD, <Dictator> Rodena Goldmann MD ELECTRONICALLY SIGNED 05/14/2014 17:26

## 2014-07-18 NOTE — Consult Note (Signed)
PATIENT NAME:  Roy, Valentine MR#:  [id number] DATE OF BIRTH:  08-23-1970  DATE OF CONSULTATION:  04/03/2014  CONSULTING PHYSICIAN:  Elvena Oyer R. Christinamarie Tall, MD  PRIMARY CARE PROVIDER: Tracie Harrier, MD, but patient has not seen him in 3 years.   REASON FOR CONSULTATION: Hypertension.   HISTORY OF PRESENTING ILLNESS: A 44 year old Caucasian male patient with history of hypertension, sigmoid colon resection with colostomy in the past who was admitted to the hospital for reversal of his colostomy, has had significantly elevated blood pressure all through his hospital stay over the last 2 days. The patient's blood pressure has been as high as 182/124, but consistently over 811 systolic. He is also tachycardic in the 120s.   He mentions that he was on blood pressure medications in the past with Benicar/hydrochlorothiazide, but has not taken these medications 3 years. Initially, he stopped his medications thinking that what is causing his abdominal pain, but later he was diagnosed with diverticulosis and later this lead to infection and his sigmoid colon resection and colostomy.   Presently he is on a PCA pump for his severe pain.   PAST MEDICAL HISTORY:  1.  Hypertension.  2.  Sigmoid colon resection with colostomy.  3.  Spinal surgeries.  4.  Tonsillectomy.  5.  Hyperlipidemia.  6.  Depression.   SOCIAL HISTORY: The patient smoked in the past but quit 6 months back. Does not drink alcohol. No illicit drug use. 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   ALLERGIES: No known drug allergies.   FAMILY HISTORY: Patient was adopted, does not know medical history of his parents.   REVIEW OF SYSTEMS:  CONSTITUTIONAL: Complains of fatigue.  EYES: No blurred vision, pain, redness.  CARDIOVASCULAR: S1, S2, tachycardic without any murmurs. No edema.  RESPIRATORY: Normal work of breathing. Clear to auscultation on both sides.  GASTROINTESTINAL: Has an ileostomy bag  along with the dressing over his recent surgery. Bowel sounds decreased.  SKIN: Warm and dry. Has surgical scars.  NEUROLOGICAL: Motor strength 5/5 in upper extremities.  LYMPHATICS: No cervical lymphadenopathy.  GENITOURINARY: No CVA or bladder distention.   LABORATORY STUDIES: Glucose of 160, BUN 10, creatinine 0.95, sodium 137, potassium 4.2. WBC 27, hemoglobin 19.2, platelets of 220,000.   ASSESSMENT AND PLAN:  1.  Accelerated hypertension in a patient with diagnosis of hypertension, but not on medications for more than 2 years. The patient has had consistently elevated blood pressure over 160/90 during this hospitalization. At this point, we will give him 20 mg intravenous dose of labetalol. Metoprolol oral has been started by Dr. Pat Patrick, which will be continued. We will also add hydrochlorothiazide/lisinopril. We will add p.r.n. intravenous labetalol as needed. He does have hypertension, which needs to be treated but also this is contributed by his uncontrolled abdominal pain. Changes have been made to his dosing regimen to the PCA pump earlier.   2.  Sepsis. The patient does meet sepsis criteria with elevated white count and tachycardia. Likely seeding from his abdominal surgery. He is on Invanz. Also, the patient has had consistently elevated white blood cell count even during his prior hospitalization. This needs to be followed up as outpatient.  3.  Polycythemia. Etiology is unclear, but could be hemoconcentration or patient may have some sleep apnea.  4.  Deep vein prophylaxis. The patient is on Lovenox.   TIME SPENT TODAY ON THIS CASE: 40 minutes.    ____________________________ Leia Alf Cyndel Griffey, MD srs:bm D:  04/03/2014 16:59:36 ET T: 04/04/2014 02:23:36 ET JOB#: [id number]  cc: Alveta Heimlich R. Akeel Reffner, MD, <Dictator> Neita Carp MD ELECTRONICALLY SIGNED 04/04/2014 18:44

## 2014-07-18 NOTE — Op Note (Signed)
PATIENT NAME:  Roy Valentine, Roy Valentine MR#:  [id number] DATE OF BIRTH:  04/13/1970  DATE OF PROCEDURE:  07/06/2014.  PREOPERATIVE DIAGNOSES:  Perforated viscus, acute abdomen.   POSTOPERATIVE DIAGNOSES:   Perforated viscus, acute abdomen.   PROCEDURE PERFORMED:  Exploratory laparotomy, lysis of adhesions, drainage of intra-abdominal abscess, small-bowel resection with primary handsewn end-to-end anastomosis.   SURGEON:  Sherri Rad, MD.   ASSISTANT:  Dr. Bronson Ing.   ANESTHESIA:  General endotracheal.   FINDINGS:  There was a 1 cm ischemic-appearing perforation on the antimesenteric border of the prior handsewn, small bowel anastomosis. There was significant purulent material and fibrinopurulent material within the abdomen.  The bowel was very thickened and foreshortened in the mesentery.  There were extensive adhesions which were very mature.  Adhesions were dense.   SPECIMENS:  Small bowel.   ESTIMATED BLOOD LOSS:  600 mL.   DESCRIPTION OF PROCEDURE:  With informed consent, supine position, general endotracheal anesthesia, the patient's abdomen was widely clipped of hair, sterilely prepped and draped with ChloraPrep solution.  A timeout was observed.  The previous midline incision was entered sharply above the scar with scalpel and divided through electrocautery and scalpel through the fascia.  Adhesions were taken down with sharp dissection.  The incision was lengthened inferiorly.  In the left lower quadrant underneath the previous ostomy site closure was a large degree of fibrinous and purulent material.  Disrupting this demonstrated an area of perforation of the small bowel as described above.  A loop of small bowel could be brought up into the wound sufficiently such that a resection could be achieved.  Of note, there were multiple adhesions which were dense and thick, and the small bowel was very thickened with fibrinopurulent material covering all surfaces.  Small bowel could be identified  in such a way that a resection could be safely achieved.  GIA 75 stapler was used to transect the bowel on either side several centimeters from the perforation.  The intervening mesentery was then sequentially divided with electrocautery as well as with the LigaSure apparatus.  Small bowel remained in close proximity. There was no undue tension.   We elected to excise each staple line then perform a handsewn single-layered anastomosis utilizing a running #3-0 PDS suture through full thickness of the bowel in an end-toend fashion.  3-0 seromuscular silk sutures were then able to be placed at several points along the suture line to imbricate seromuscular portions of the bowel.  However, this could not be achieved circumferentially due to the nature of thickening of the bowel.  5 mL of fibrin glue was then instilled over the suture line.  An adjacent loop of small bowel was then sutured down to the anastomosis to create a serosal patch.  With lap and needle count correct x 2, the abdomen was then copiously irrigated with several liters of warm normal saline and aspirated dry.  Hemostasis appeared to be adequate.   Fascia was then closed by lifting the scar from subcutaneous tissue exposing the fascial edge. Running looped #1 PDS was placed followed by 2 external retention sutures of #2 nylon.  These were then tied down in the middle and the retention sutures were then applied.  Skin staples were used over a 1-inch Penrose drain.  Sterile dressing was applied.  The patient remained intubated and was taken to the intensive care unit in stable and satisfactory condition by anesthesia services.     ____________________________ Roy How Marina Gravel, MD mab:kc D: 07/07/2014 11:11:19  ET T: 07/07/2014 13:58:37 ET JOB#: [id number]  cc: Elta Guadeloupe A. Marina Gravel, MD, <Dictator> Hortencia Conradi MD ELECTRONICALLY SIGNED 07/14/2014 12:44

## 2014-07-18 NOTE — Op Note (Signed)
PATIENT NAME:  Roy Valentine, Roy Valentine MR#:  [id number] DATE OF BIRTH:  07-02-1970  DATE OF PROCEDURE:  04/02/2014  PREOPERATIVE DIAGNOSIS: Status post diverticular abscess with colon resection and colostomy.   POSTOPERATIVE DIAGNOSIS: Status post diverticular abscess with colon resection and colostomy.   OPERATION: Colostomy closure with protective ileostomy.   SURGEON: Rodena Goldmann, MD  ANESTHESIA: General.   ASSISTANT: Lew Dawes. Oaks, MD  OPERATIVE PROCEDURE: With the patient in the supine position, after the induction of appropriate general anesthesia, the patient's abdomen was prepped with Betadine and draped with sterile towels. The patient was placed in the lithotomy position. An alcohol wipe and Betadine impregnated Steri-Drape were utilized. A Foley catheter was placed.   An Optiview port was inserted in the right upper quadrant under direct vision to allow access to abdominal cavity. Multiple adhesions were encountered. Under direct vision, 2 robot ports were placed without difficulty. The robot was brought to the table, docked to the patient, instruments inserted under direct vision. I moved to the console. All of the abdominal wall adhesions, including small bowel and colon, were taken down with the robotic device. The adhesions were extremely dense and dissection was incredibly tedious. However, the abdomen was cleared of all adhesions, the colon was identified at the ostomy, and the plan then changed to an open procedure.  I could not visualize all of the colon, nor could I see the rectal stump.   The robot was undocked. Ports were removed. I then moved back to the table. The previous incision was opened down through the subcutaneous tissue using Bovie electrocautery. The midline fascia was identified and opened in the line of the skin incision, as was the peritoneum. The patient was placed in slight Trendelenburg position, and the bowel contents packed out of the pelvis. The Prolene  suture was identified and the rectum dissected free from the pelvis. The splenic flexure was again taken down. The ostomy divided at the fascia level with the Contour stapling device. The bowel mobilized into the pelvis. Multiple attempts to get the stapler into the rectum were unsuccessful. The bowel was quite tight and very thickened.   I elected to perform a handsewn 2-layer anastomosis with seromuscular of silk on the posterior and anterior layer and a running 3-0 Vicryl through-and-through suture on the internal layer. However, because of the thickened bowel, I felt it was important to protect the anastomosis against leak. The previous ostomy site was ellipsed, and the remaining segment of bowel under the skin removed. The other section of small bowel was brought up through the same ostomy site, a bridge placed underneath. The bowel was opened through that site. It was quite thickened there, and I was afraid that the ileostomy would not mature appropriately, so I resected that small piece of bowel. I used a GIA-55 stapling device to join the 2 limbs, and then left the enterotomy open as the ileostomy. I placed a bridge behind. The ostomy was matured with 3-0 Vicryl. VAC was placed. Penrose drain was placed in the depths of the wound, and the skin stapled. Sterile dressings were then applied.   The patient was returned to the recovery room, having tolerated the procedure well.   Sponge, instrument and needle counts were correct x 2 in the Operating Room.    ____________________________ Rodena Goldmann III, MD rle:MT D: 04/02/2014 15:26:14 ET T: 04/02/2014 16:02:05 ET JOB#: [id number]  cc: Rodena Goldmann III, MD, <Dictator> Rodena Goldmann MD ELECTRONICALLY SIGNED 04/03/2014 18:42

## 2014-07-18 NOTE — Discharge Summary (Signed)
PATIENT NAME:  [name], [name] MR#:  [id number] DATE OF BIRTH:  1971-02-23  DATE OF ADMISSION:  05/12/2014 DATE OF DISCHARGE:  05/15/2014  BRIEF HISTORY: Roy Valentine is a 44 year old gentleman admitted to the hospital for closure of an ileostomy. He had a Hartmann procedure performed in the summer of 2015 and then underwent a colostomy closure in early January. However, the anastomosis was low and difficult, so I performed a diverting proximal colostomy. He was admitted for ileostomy reversal. The procedure was carried out on February 24 and was otherwise remarkable. He had no significant postoperative complications. He was tolerating a liquid diet by the 25th and a soft diet by the  26th with his first bowel movement on the 26th. He was discharged home on the 27th.  He will follow in the office in 7 to 10 days' time. Bathing, activity, and driving instructions were given to the patient.   DISCHARGE MEDICATIONS: Hydrocodone and acetaminophen 5/325 every 4 to 6 hours p.r.n.   FINAL DISCHARGE DIAGNOSIS: Status post diverticulitis and a Hartmann procedure. Surgery, closure ileostomy.     ____________________________ Rodena Goldmann III, MD rle:at D: 05/19/2014 21:26:54 ET T: 05/20/2014 09:27:04 ET JOB#: [id number]  cc: Rodena Goldmann III, MD, <Dictator> Tracie Harrier, MD Rodena Goldmann MD ELECTRONICALLY SIGNED 05/24/2014 9:27

## 2014-07-18 NOTE — H&P (Addendum)
PATIENT NAME:  Roy Valentine, Roy Valentine MR#:  [id number] DATE OF BIRTH:  1971/01/06  DATE OF ADMISSION:  07/04/2014  PRIMARY CARE PHYSICIAN: Tracie Harrier, MD at [facility].  ADMITTING PHYSICIAN: Rodena Goldmann III, MD  CHIEF COMPLAINT: Abdominal pain.   BRIEF HISTORY: Becker Demaryius is a 44 year old gentleman well known to our service. In July 2015, he presented to the hospital with perforated sigmoid diverticulitis with a large pelvic abscess. His situation was unresponsive to conservative therapy at that time, and he underwent a Hartmann procedure with end colostomy, blind distal loop and drainage of his pelvic abscess, slow recovery of his bowel function, and a prolonged hospitalization. He was re-evaluated and then taken back to the operating room in January 2016, where he underwent a closure of his Jeanette Caprice procedure. His abdomen was quite hostile at that time, with multiple adhesions requiring extensive dissection to be able to expose the distal colon and take down the proximal colon. The bowel was quite thickened and I performed a hand-sewn deep pelvic anastomosis in 2 layers. Because of the operative time, the significant adhesions, and the thickened bowel, I elected to perform a proximal diverting ileostomy to protect the distal anastomosis. He did well with that procedure, recovered uneventfully, returned 6 weeks later for ileostomy takedown. The procedure was uncomplicated and he did not have any significant postoperative problems. The ileostomy closure was accomplished on 05/12/2014. He has been followed in the office and has done well until today. He had a normal day until this afternoon, when he was having a bowel movement and suffered sudden onset of left lower quadrant pain. The pain was intense, accompanied by diaphoresis. Because of the severity of the symptoms, he presented to the Emergency Room. Workup in the Emergency Room revealed elevated blood pressure, mild tachycardia, and  significant abdominal pain. White blood cell count was elevated at nearly 14,000. Plain films were unremarkable. CT scan was performed with contrast, which demonstrated what appeared to be a small-bowel perforation likely at the site of the previous ileostomy closure. There were several small bubbles of air and some free fluid at that point. The site is against the anterior abdominal wall in the left lower quadrant in the site of the previous ostomy closure.   The patient denies any recent symptoms of any type. He specifically has no abdominal pain, nausea, vomiting, change in bowel habits, fever, or chills. Past abdominal history is otherwise unremarkable with the exception of the problems noted above. His only other surgery was a back surgery. He does have a history of hypertension. No cardiac disease, diabetes, or thyroid problems. He is followed by Dr. Ginette Pitman at the [facility].   CURRENT MEDICATIONS: Hydrocodone and acetaminophen p.r.n. He has not taken any in several months.  ALLERGIES: He has no medical allergies.  SOCIAL HISTORY: He does not smoke cigarettes. He currently drinks alcohol occasionally. He has recently gone back to work following his most recent surgery.   FAMILY HISTORY: Significant for hypertension.  REVIEW OF SYSTEMS: Otherwise unremarkable. Specifically, he has no chest pain, shortness of breath, neurologic complaints, or urinary symptoms.   PHYSICAL EXAMINATION:  GENERAL: He is clearly significantly uncomfortable. He is lying at rest in bed with pain medication. VITAL SIGNS: Blood pressure 160/108, heart rate is 120 and regular. He is not febrile currently. Oxygen saturation is 93% on room air. He rates his pain as a 9.  HEENT: Reveals him to be moderately flushed with normal eyes, normal ears. He has no scleral  icterus or pupillary abnormalities. LYMPHATIC: There is no adenopathy in his axilla or cervical areas. CHEST: Clear with no adventitious sounds and he has  normal pulmonary excursion.   CARDIAC: Reveals no murmurs or gallops, but he has a very rapid rate and I cannot tell about his rhythm. I suspect it is a regular rhythm.  ABDOMEN: Tense, distended with marked guarding and rebound on the left side. He does have bowel sounds but they are hypoactive. There are no obvious wound problems. No hernias are noted.  EXTREMITIES: Lower extremity exam reveals full range of motion, no deformities; upper extremity exam the same.  NEUROLOGIC: Reveals equal and symmetrical motor sensory findings and no cranial nerve issues.  SKIN: Reveals large abdominal scars from his previous surgery, but no contusions, lesions, or abrasions.  PSYCHIATRIC: Reveals a flat affect from his medication, but normal orientation.   ASSESSMENT AND PLAN: I have independently reviewed his CT scan and discussed the findings with the radiologist. There does appear to be free air and fluid at the site of the previous ileostomy. The colon anastomosis and left colon appear to be uninvolved. I suspect that he has had a small leak at the site of his ileostomy closure, etiology unclear. He is 2 months out from surgery, so it would be unlikely to be a technical error at this point.  We will treat him conservatively at this point, since he has a hostile abdomen and I would not like to attempt a re-exploration if at all possible. The site of perforation appears to be up against his anterior abdominal wall, so if he were to develop an abscess this should be approachable with percutaneous drainage. We will put him at bowel rest, antibiotic therapy, hydration, and pain medicine. I have discussed the plan with the patient. We will likely scan him in 48 hours to see what progress we are making unless his clinical condition deteriorates. He is in agreement with this plan.   ____________________________ Micheline Maze, MD rle:ST D: 07/04/2014 22:38:03 ET T: 07/04/2014 23:31:39 ET JOB#: [id number]  cc: Micheline Maze, MD, <Dictator> Tracie Harrier, MD Rodena Goldmann MD ELECTRONICALLY SIGNED 08/02/2014 14:29

## 2014-07-22 NOTE — Discharge Summary (Signed)
Physician Discharge Summary  Patient ID: Roy Valentine MRN: [id number] DOB/AGE: [birth year] 45 y.o.  Admit date: 07/04/2014 Discharge date: 07/22/2014  Admission Diagnoses: Perforated viscus  Discharge Diagnoses: Perforated viscus  Discharged Condition: Stable and improved  Hospital Course: The patient was admitted with significant left lower quadrant abdominal pain and abnormal CT scan. He was placed on bowel rest and intravenous antibiotics were instituted repeat CT scan demonstrated more fluid patient was taken to the operating room for expiratory laparotomy. Small bowel resection was performed and single layered fashion. Postoperatively, he was maintained with a nasogastric tube until postoperative day #2 as well as intravenous nutrition via a PICC line. He had resolution of his ileus and this diet was able to be advanced. He had a minor wound infection inferior aspect which was opened at the bedside. Posterior stable and improved for discharge by postoperative day #10.  Disposition: 01-Home or Self Care     Medication List    ASK your doctor about these medications        aspirin 81 MG chewable tablet  Chew 81 mg by mouth daily.     oxyCODONE-acetaminophen 5-325 MG/5ML solution  Commonly known as:  ROXICET  Take by mouth every 6 (six) hours as needed for moderate pain (4-6/10).         SignedSherri Rad 07/22/2014, 7:56 PM

## 2014-07-23 ENCOUNTER — Ambulatory Visit
Admission: RE | Admit: 2014-07-23 | Discharge: 2014-07-23 | Disposition: A | Discharge status: Home / Self Care | Payer: 59 | Source: Ambulatory Visit | Attending: Surgery | Admitting: Surgery

## 2014-07-23 ENCOUNTER — Telehealth: Payer: Self-pay | Admitting: Surgery

## 2014-07-23 ENCOUNTER — Other Ambulatory Visit: Payer: Self-pay | Admitting: Surgery

## 2014-07-23 DIAGNOSIS — IMO0002 Reserved for concepts with insufficient information to code with codable children: Secondary | ICD-10-CM

## 2014-07-23 DIAGNOSIS — R1032 Left lower quadrant pain: Secondary | ICD-10-CM

## 2014-07-23 DIAGNOSIS — L02211 Cutaneous abscess of abdominal wall: Secondary | ICD-10-CM | POA: Insufficient documentation

## 2014-07-23 MED ORDER — IOHEXOL 300 MG/ML  SOLN
100.0000 mL | Freq: Once | INTRAMUSCULAR | Status: AC | PRN
  Administered 2014-07-23: 100 mL via INTRAVENOUS

## 2014-07-23 MED ORDER — IOHEXOL 240 MG/ML SOLN
25.0000 mL | INTRAMUSCULAR | Status: AC
  Administered 2014-07-23 (×2): 25 mL via ORAL

## 2014-07-23 NOTE — Telephone Encounter (Signed)
Have contacted Roy Valentine regarding his CT results and my strong recommendation that he be admitted for management for probable anastamotic leak.  He is refusing at this time and says that he will come in tomorrow am.

## 2014-07-23 NOTE — Progress Notes (Signed)
Have attempted to reach Mr. Kanner by cell phone [phone] and left message. Also attempted to leave message with Emergency Contact Danny Schue but unable to leave message.  Will attempt again tonight and tomorrow if need be.  He has been given my contact number to call me back.

## 2014-07-24 ENCOUNTER — Inpatient Hospital Stay: Payer: 59

## 2014-07-24 ENCOUNTER — Inpatient Hospital Stay
Admission: RE | Admit: 2014-07-24 | Discharge: 2014-07-27 | DRG: 394 | Disposition: A | Discharge status: Home Health | Payer: 59 | Source: Ambulatory Visit | Attending: Surgery | Admitting: Surgery

## 2014-07-24 DIAGNOSIS — Z9049 Acquired absence of other specified parts of digestive tract: Secondary | ICD-10-CM | POA: Diagnosis present

## 2014-07-24 DIAGNOSIS — K651 Peritoneal abscess: Secondary | ICD-10-CM

## 2014-07-24 DIAGNOSIS — F329 Major depressive disorder, single episode, unspecified: Secondary | ICD-10-CM | POA: Diagnosis present

## 2014-07-24 DIAGNOSIS — K668 Other specified disorders of peritoneum: Principal | ICD-10-CM | POA: Diagnosis present

## 2014-07-24 DIAGNOSIS — E785 Hyperlipidemia, unspecified: Secondary | ICD-10-CM | POA: Diagnosis present

## 2014-07-24 DIAGNOSIS — I10 Essential (primary) hypertension: Secondary | ICD-10-CM | POA: Diagnosis present

## 2014-07-24 DIAGNOSIS — L02211 Cutaneous abscess of abdominal wall: Secondary | ICD-10-CM | POA: Diagnosis present

## 2014-07-24 DIAGNOSIS — Z95828 Presence of other vascular implants and grafts: Secondary | ICD-10-CM

## 2014-07-24 MED ORDER — CLINDAMYCIN PHOSPHATE 600 MG/50ML IV SOLN
600.0000 mg | Freq: Three times a day (TID) | INTRAVENOUS | Status: DC
  Administered 2014-07-24 – 2014-07-27 (×10): 600 mg via INTRAVENOUS
  Filled 2014-07-24 (×13): qty 50

## 2014-07-24 MED ORDER — DIPHENHYDRAMINE HCL 50 MG/ML IJ SOLN: 12.5000 mg | Freq: Four times a day (QID) | INTRAMUSCULAR | Status: DC | PRN

## 2014-07-24 MED ORDER — CEFTRIAXONE SODIUM IN DEXTROSE 20 MG/ML IV SOLN
1.0000 g | INTRAVENOUS | Status: DC
  Administered 2014-07-24: 1 g via INTRAVENOUS
  Filled 2014-07-24 (×2): qty 50

## 2014-07-24 MED ORDER — ACETAMINOPHEN 325 MG PO TABS
650.0000 mg | ORAL_TABLET | Freq: Four times a day (QID) | ORAL | Status: DC | PRN
  Administered 2014-07-26: 650 mg via ORAL
  Filled 2014-07-24: qty 2

## 2014-07-24 MED ORDER — OXYCODONE-ACETAMINOPHEN 5-325 MG/5ML PO SOLN: 5.0000 mL | Freq: Four times a day (QID) | ORAL | Status: DC | PRN

## 2014-07-24 MED ORDER — ACETAMINOPHEN 650 MG RE SUPP: 650.0000 mg | Freq: Four times a day (QID) | RECTAL | Status: DC | PRN

## 2014-07-24 MED ORDER — KCL IN DEXTROSE-NACL 20-5-0.45 MEQ/L-%-% IV SOLN
INTRAVENOUS | Status: DC
  Administered 2014-07-24 – 2014-07-27 (×4): via INTRAVENOUS
  Filled 2014-07-24 (×5): qty 1000

## 2014-07-24 MED ORDER — ACETAMINOPHEN 160 MG/5ML PO SOLN
325.0000 mg | Freq: Four times a day (QID) | ORAL | Status: DC | PRN
  Filled 2014-07-24: qty 10.2

## 2014-07-24 MED ORDER — DIPHENHYDRAMINE HCL 12.5 MG/5ML PO ELIX: 12.5000 mg | ORAL_SOLUTION | Freq: Four times a day (QID) | ORAL | Status: DC | PRN

## 2014-07-24 MED ORDER — OXYCODONE HCL 5 MG/5ML PO SOLN
5.0000 mg | Freq: Four times a day (QID) | ORAL | Status: DC | PRN
  Administered 2014-07-24 – 2014-07-27 (×9): 5 mg via ORAL
  Filled 2014-07-24 (×9): qty 5

## 2014-07-24 NOTE — H&P (Signed)
Patient ID: Roy Valentine, male   DOB: 01-02-1971, 44 y.o.   MRN: [id number]  HPI Roy Valentine is a 44 y.o. male with a very complex surgical history. He underwent sigmoid colectomy July 2015 for perforated diverticulitis and colostomy takedown in January 2016 with diverting loop ileostomy because his anastomosis was low. 05/12/2014 he underwent ileostomy closure with end-to-end anastomosis and 2 layer handsewn technique. In late April he had a sudden onset of abdominal pain from a perforation at the site of his ileostomy anastomosis that appeared ischemic at the time. 07/06/2014 he had exploratory laparotomy and resection of the perforation of the anastomosis with the single-layer re-anastomosis supported with a serosal patch from another loop of bowel. He had significant wound problems but did well until a week ago. He began having chills and low-grade fevers and increasing left-sided abdominal pain, and ultimately underwent CT scan which showed small amounts of scattered free air with most likely location of the perforation being his old sigmoid colon area or low pelvic anastomosis.  Past Medical History: Depression Dyslipidemia Diverticulitis Hypertension  Medications: Percocet History reviewed. No pertinent family history.  Social History History  Substance Use Topics  . Smoking status: Never Smoker   . Smokeless tobacco: Never Used  . Alcohol Use: Not on file    No Known Allergies  No current facility-administered medications for this encounter.    Review of Systems A 10 point review of systems was asked and was negative except for the following positive findings: Low grade fever, chills, left lower quadrant abdominal pain.  Blood pressure 137/85, pulse 105, temperature 97.9 F (36.6 C), temperature source Oral, resp. rate 19, height 5\' 9"  (1.753 m), weight 75.297 kg (166 lb), SpO2 94 %.  Physical Exam CONSTITUTIONAL: Healthy appearing very pleasant middle-aged  white male. EYES: Pupils equal, round, and reactive to light, Sclera non-icteric. EARS, NOSE, MOUTH AND THROAT: The oropharynx is clear. Oral mucosa is pink and moist. Hearing is intact to voice.  NECK: Trachea is midline, and there is no jugular venous distension. Thyroid is without palpable abnormalities. LYMPH NODES:  Lymph nodes in the neck are not enlarged. RESPIRATORY:  Lungs are clear, and breath sounds are equal bilaterally. Normal respiratory effort without pathologic use of accessory muscles. CARDIOVASCULAR: Heart is regular without murmurs, gallops, or rubs. GI: The abdomen is examined and found to have a retention suture as well as multiple scars. It is soft, nontender, and nondistended. There were no palpable masses. There was no hepatosplenomegaly. There were normal bowel sounds. GU: Deferred MUSCULOSKELETAL:  Normal muscle strength and tone in all four extremities.    SKIN: Skin turgor is normal. There are no pathologic skin lesions.  NEUROLOGIC:  Motor and sensation is grossly normal.  Cranial nerves are grossly intact. PSYCH:  Alert and oriented to person, place and time. Affect is normal.  Data Reviewed Recent CT scan. I have personally reviewed the patient's imaging and medical records.    Assessment Scattered pneumoperitoneum remote from previous surgeries and a patient who has had previous remote anastomotic leaks. The patient's course seems consistent with Crohn's disease, but there has not been any Crohn's disease elucidated on any of his pathology.  Plan Admit, IV fluid hydration, IV antibiotics, PICC line placement, and either transfer to home on IV antibiotics for transfer to a tertiary [facility], such as [facility].  Face-to-face time spent with the patient and care providers was 50 minutes, with more than 50% of the time spent counseling,  educating, and coordinating care of the patient.     Consuela Mimes 07/24/2014, 4:11 PM

## 2014-07-25 LAB — CBC
HCT: 39.8 % — ABNORMAL LOW (ref 40.0–52.0)
HEMOGLOBIN: 13.2 g/dL (ref 13.0–18.0)
MCH: 29.4 pg (ref 26.0–34.0)
MCHC: 33.2 g/dL (ref 32.0–36.0)
MCV: 88.6 fL (ref 80.0–100.0)
Platelets: 538 10*3/uL — ABNORMAL HIGH (ref 150–440)
RBC: 4.49 MIL/uL (ref 4.40–5.90)
RDW: 14.3 % (ref 11.5–14.5)
WBC: 8.6 10*3/uL (ref 3.8–10.6)

## 2014-07-25 LAB — COMPREHENSIVE METABOLIC PANEL
ALT: 14 U/L — AB (ref 17–63)
AST: 13 U/L — ABNORMAL LOW (ref 15–41)
Albumin: 2.8 g/dL — ABNORMAL LOW (ref 3.5–5.0)
Alkaline Phosphatase: 69 U/L (ref 38–126)
Anion gap: 9 (ref 5–15)
BUN: 9 mg/dL (ref 6–20)
CALCIUM: 9.2 mg/dL (ref 8.9–10.3)
CO2: 28 mmol/L (ref 22–32)
Chloride: 101 mmol/L (ref 101–111)
Creatinine, Ser: 0.78 mg/dL (ref 0.61–1.24)
GFR calc Af Amer: >60 mL/min (ref >60)
GFR calc non Af Amer: >60 mL/min (ref >60)
Glucose, Bld: 89 mg/dL (ref 65–99)
Potassium: 3.7 mmol/L (ref 3.5–5.1)
SODIUM: 138 mmol/L (ref 135–145)
Total Bilirubin: 0.2 mg/dL — ABNORMAL LOW (ref 0.3–1.2)
Total Protein: 7.2 g/dL (ref 6.5–8.1)

## 2014-07-25 LAB — PROTIME-INR
INR: 1.09
Prothrombin Time: 14.3 seconds (ref 11.4–15.0)

## 2014-07-25 LAB — PHOSPHORUS: Phosphorus: 4.2 mg/dL (ref 2.5–4.6)

## 2014-07-25 LAB — MAGNESIUM: Magnesium: 1.9 mg/dL (ref 1.7–2.4)

## 2014-07-25 LAB — APTT: APTT: 31 s (ref 24–36)

## 2014-07-25 MED ORDER — CEFTRIAXONE SODIUM IN DEXTROSE 20 MG/ML IV SOLN
1.0000 g | INTRAVENOUS | Status: DC
Start: 2014-07-25 — End: 2014-07-27
  Administered 2014-07-25 – 2014-07-27 (×3): 1 g via INTRAVENOUS
  Filled 2014-07-25 (×3): qty 50

## 2014-07-25 MED ORDER — SACCHAROMYCES BOULARDII 250 MG PO CAPS
250.0000 mg | ORAL_CAPSULE | Freq: Two times a day (BID) | ORAL | Status: DC
  Administered 2014-07-25 – 2014-07-27 (×4): 250 mg via ORAL
  Filled 2014-07-25 (×8): qty 1

## 2014-07-25 NOTE — Progress Notes (Signed)
Subjective:  Subjectively, he is unchanged from yesterday.  Vital signs in last 24 hours: Temp:  [97.5 F (36.4 C)-98.6 F (37 C)] 98.4 F (36.9 C) (05/08 0915) Pulse Rate:  [87-105] 87 (05/08 0915) Resp:  [16-19] 17 (05/08 0915) BP: (122-137)/(78-85) 129/83 mmHg (05/08 0915) SpO2:  [94 %-98 %] 96 % (05/08 0915) Weight:  [75.297 kg (166 lb)] 75.297 kg (166 lb) (05/07 1435) Last BM Date: 07/23/14  Intake/Output from previous day:    GI: Unchanged  Lab Results:  CBC  Recent Labs  07/25/14 0803  WBC 8.6  HGB 13.2  HCT 39.8*  PLT 538*   CMP     Component Value Date/Time   NA 134* 07/16/2014 0523   K 4.2 07/16/2014 0523   CL 97* 07/16/2014 0523   CO2 29 07/16/2014 0523   GLUCOSE 98 07/16/2014 0523   BUN 15 07/16/2014 0523   CREATININE 0.76 07/16/2014 0523   CALCIUM 8.9 07/16/2014 0523   PROT 6.1* 07/08/2014 0437   ALBUMIN 2.4* 07/08/2014 0437   AST 14* 07/08/2014 0437   ALT 26 07/04/2014 1844   ALKPHOS 63 07/08/2014 0437   GFRNONAA >60 07/16/2014 0523   GFRAA >60 07/16/2014 0523   PT/INR  Recent Labs  07/25/14 0803  LABPROT 14.3  INR 1.09    Studies/Results: Ct Abdomen Pelvis W Contrast  07/23/2014   CLINICAL DATA:  LEFT lower quadrant pain. Abdominal abscess. Small bowel surgery 2 weeks ago.  EXAM: CT ABDOMEN AND PELVIS WITH CONTRAST  TECHNIQUE: Multidetector CT imaging of the abdomen and pelvis was performed using the standard protocol following bolus administration of intravenous contrast.  CONTRAST:  133mL OMNIPAQUE IOHEXOL 300 MG/ML  SOLN  COMPARISON:  CT 07/06/2014.  FINDINGS: Musculoskeletal:  No aggressive osseous lesions.  Lung Bases: Posterior basilar atelectasis.  Liver:  No hepatic mass lesion.  Spleen:  Normal.  Gallbladder:  Normal.  No calcified gallstones.  Common bile duct:  Normal.  Pancreas:  Normal.  Adrenal glands:  Normal bilaterally.  Kidneys: Normal renal enhancement and delayed excretion of contrast. No calculi. LEFT ureter appears  normal. RIGHT ureter appears normal.  Stomach:  Distended with oral contrast.  Small bowel: Duodenum appears normal. There is no small bowel obstruction. Mild distension of small bowel loops present extending to the anatomic pelvis without a transition point. Bowel loops are opacified with contrast.  There is a tiny area of high density adjacent to small bowel loops in the LEFT lower quadrant (image 58 series 2) potentially representing site of leak.  Colon: Normal appendix. Oral contrast is present in the colon. There is colonic diverticulosis. Colonic anastomotic staple line is present in the anatomic pelvis (image 61 series 2). Around this location, there is gas in the sigmoid mesocolon in this tracks anteriorly through the abdomen around loops of small bowel.  Pelvic Genitourinary:  Urinary bladder appears normal.  Peritoneum: Free air is present in the abdomen. Small amounts of free air present along the liver margin and in Morrison's pouch. Most of the free air is in the LEFT lower quadrant and tracks anteriorly to the surgical wound. There is gas deep to the staple line in the midline at the site of prior laparotomy. Gas outlined several loops of small bowel in the LEFT lower quadrant.  Extraluminal air and fluid is present along the LEFT anterior abdominal wall, probably at the site of ileostomy takedown consistent with a developing abscess (image 53 series 2).  Vasculature: Mild atherosclerosis. No portal venous  gas. Portal venous system appears patent.  Reactive small bowel mesenteric adenopathy.  Body Wall: Small abscess is present deep to the surgical incision at the superior margin of the pelvis (image 67 series 2) measuring 31 mm AP x 20 mm transverse. Tiny fat containing LEFT inguinal hernia.  IMPRESSION: 1. Free air in the abdomen is greater than expected following laparotomy 07/07/2014. The amount of air suggests an enteric leak although the site is unclear. The most air is in the small bowel  mesentery and sigmoid mesocolon in the LEFT lower quadrant. Some of this is chronic and associated with an anastomotic staple line adjacent to the sigmoid colon. 2. Phlegmon surrounds loops of small bowel in the LEFT lower quadrant (image 58 series 2) with a small area of high attenuation adjacent bowel loops the could represent a site of extravasation of oral contrast. 3. No small bowel obstruction or peritoneal extravasation of contrast from small bowel which is well opacified. 4. Small abscess (3 centimeters) deep to the laparotomy incision. Small developing abscess in the LEFT lower quadrant just deep to the anterior abdominal wall probably at the site of ileostomy takedown. Critical Value/emergent results were called by telephone at the time of interpretation on 07/23/2014 at 7:14 pm to Dr. Rexene Edison (Covering on call surgeon), who verbally acknowledged these results.   Electronically Signed   By: Dereck Ligas M.D.   On: 07/23/2014 19:19   Dg Chest Port 1 View  07/24/2014   CLINICAL DATA:  PICC line retraction  EXAM: PORTABLE CHEST ONE VIEW:  COMPARISON:  Portable exam 2213 hr compared to 2205 hr  FINDINGS: PICC line has been partially withdrawn with tip now projecting over SVC.  Stable heart size, mediastinal contours and pulmonary vascularity.  Lungs clear.  No pleural effusion or pneumothorax.  Bones unremarkable.  IMPRESSION: No acute abnormalities.   Electronically Signed   By: Lavonia Dana M.D.   On: 07/24/2014 22:42   Dg Chest Port 1 View  07/24/2014   CLINICAL DATA:  PICC line placement  EXAM: PORTABLE CHEST - 1 VIEW  COMPARISON:  07/07/2014  FINDINGS: The left upper extremity PICC line extends into the right atrium. The old right extremity PICC line has been removed. The lungs are clear. Hilar and mediastinal contours are unremarkable.  IMPRESSION: New left upper extremity PICC line extends into the right atrium   Electronically Signed   By: Andreas Newport M.D.   On: 07/24/2014 22:34     Assessment/Plan: Disposition to be determined by Drs. Burt Knack and [place] tomorrow or Tuesday

## 2014-07-26 LAB — CBC WITH DIFFERENTIAL/PLATELET
BASOS ABS: 0.1 10*3/uL (ref 0–0.1)
BASOS PCT: 1 %
EOS ABS: 0.3 10*3/uL (ref 0–0.7)
Eosinophils Relative: 4 %
HEMATOCRIT: 38.7 % — AB (ref 40.0–52.0)
Hemoglobin: 13 g/dL (ref 13.0–18.0)
LYMPHS PCT: 26 %
Lymphs Abs: 2.2 10*3/uL (ref 1.0–3.6)
MCH: 29.8 pg (ref 26.0–34.0)
MCHC: 33.7 g/dL (ref 32.0–36.0)
MCV: 88.4 fL (ref 80.0–100.0)
MONO ABS: 0.5 10*3/uL (ref 0.2–1.0)
Monocytes Relative: 6 %
Neutro Abs: 5.3 10*3/uL (ref 1.4–6.5)
Neutrophils Relative %: 63 %
Platelets: 503 10*3/uL — ABNORMAL HIGH (ref 150–440)
RBC: 4.38 MIL/uL — ABNORMAL LOW (ref 4.40–5.90)
RDW: 14.4 % (ref 11.5–14.5)
WBC: 8.5 10*3/uL (ref 3.8–10.6)

## 2014-07-26 LAB — BASIC METABOLIC PANEL
Anion gap: 7 (ref 5–15)
BUN: 8 mg/dL (ref 6–20)
CHLORIDE: 101 mmol/L (ref 101–111)
CO2: 29 mmol/L (ref 22–32)
Calcium: 9.1 mg/dL (ref 8.9–10.3)
Creatinine, Ser: 0.82 mg/dL (ref 0.61–1.24)
GFR calc non Af Amer: >60 mL/min (ref >60)
Glucose, Bld: 96 mg/dL (ref 65–99)
POTASSIUM: 3.7 mmol/L (ref 3.5–5.1)
Sodium: 137 mmol/L (ref 135–145)

## 2014-07-26 NOTE — Progress Notes (Signed)
I discussed the current situation with him in detail. He was admitted on May 7 with abdominal pain normal white blood cell count mild tachycardia no fever and CT scan evidence for another abdominal perforation. He had small amounts of free air scattered around the abdominal cavity. There did not appear to be significant free fluid and so initial impression was that he had a another small perforation.  He was started on antibiotic therapy and aggressive pain control. He is currently afebrile tachycardia has improved and his white count remains normal. On clinical examination his abdomen is soft with minimal abdominal tenderness no rebound and no guarding. He continues to have some moderate wound drainage from the inferior portion of his previous incision.  After significant discussion, we have decided to repeat his CT scan tomorrow for follow-up of his abdominal free air. If he does not have any worsening of his clinical picture or his CT scan, we will plan to discharge him home with another week to 10 days of intravenous antibiotics through his previously placed IV access. If he develops more symptoms or another episode of similar nature, we will plan to discuss transfer or evaluation at [place]. I feel at this point we have exhausted our options for intervention and it would be appropriate to have an outside evaluation. He is in agreement with this plan.

## 2014-07-26 NOTE — Progress Notes (Signed)
Recent admission to [facility] 4/17-4/29. Readmitted at this time with c/o abdominal pain, possible Crohns disease. Discharge plan pending pt having a conversation with Dr. Pat Patrick. Possible home with IV antibiotics or transfer to [facility]. Pt has no agency preference. Lives at home alone. Following.

## 2014-07-26 NOTE — Progress Notes (Signed)
CC: Abdominal pain Subjective: Patient admitted approximately 2 weeks postop with increased abdominal pain and findings of some free air bubbles in his abdomen on CT scan.  Today the patient feels better he has minimal abdominal pain no nausea vomiting fever or chills. Patient wants to go home and is confused about the plan for him. A PICC line has been placed and there has been discussion about going home on IV antibiotics or transfer to [facility].  Objective: Vital signs in last 24 hours: Temp:  [97.8 F (36.6 C)-99.1 F (37.3 C)] 97.8 F (36.6 C) (05/09 0758) Pulse Rate:  [77-87] 87 (05/09 0758) Resp:  [17-18] 18 (05/09 0758) BP: (119-133)/(67-81) 125/78 mmHg (05/09 0758) SpO2:  [95 %-97 %] 96 % (05/09 0758) Last BM Date: 07/23/14  Intake/Output from previous day: 05/08 0701 - 05/09 0700 In: 3470 [P.O.:1600; I.V.:1870] Out: 1950 [Urine:1375] Intake/Output this shift: Total I/O In: 250 [I.V.:250] Out: 250 [Urine:250]  Physical exam  Patient is afebrile and comfortable. Abdominal exam demonstrates a complex midline abdominal wound with purulence draining from a small caudad granulating area. The abdomen itself is nondistended and essentially nontender with no peritoneal signs.  Calves are nontender. He has a PICC line in the left arm.  Lab Results: CBC   Recent Labs  07/25/14 0803 07/26/14 0632  WBC 8.6 8.5  HGB 13.2 13.0  HCT 39.8* 38.7*  PLT 538* 503*   BMET  Recent Labs  07/25/14 0803 07/26/14 0632  NA 138 137  K 3.7 3.7  CL 101 101  CO2 28 29  GLUCOSE 89 96  BUN 9 8  CREATININE 0.78 0.82  CALCIUM 9.2 9.1   PT/INR  Recent Labs  07/25/14 0803  LABPROT 14.3  INR 1.09   ABG No results for input(s): PHART, HCO3 in the last 72 hours.  Invalid input(s): PCO2, PO2  Studies/Results: Dg Chest Port 1 View  07/24/2014   CLINICAL DATA:  PICC line retraction  EXAM: PORTABLE CHEST ONE VIEW:  COMPARISON:  Portable exam 2213 hr compared to 2205 hr   FINDINGS: PICC line has been partially withdrawn with tip now projecting over SVC.  Stable heart size, mediastinal contours and pulmonary vascularity.  Lungs clear.  No pleural effusion or pneumothorax.  Bones unremarkable.  IMPRESSION: No acute abnormalities.   Electronically Signed   By: Lavonia Dana M.D.   On: 07/24/2014 22:42   Dg Chest Port 1 View  07/24/2014   CLINICAL DATA:  PICC line placement  EXAM: PORTABLE CHEST - 1 VIEW  COMPARISON:  07/07/2014  FINDINGS: The left upper extremity PICC line extends into the right atrium. The old right extremity PICC line has been removed. The lungs are clear. Hilar and mediastinal contours are unremarkable.  IMPRESSION: New left upper extremity PICC line extends into the right atrium   Electronically Signed   By: Andreas Newport M.D.   On: 07/24/2014 22:34    Anti-infectives: Anti-infectives    Start     Dose/Rate Route Frequency Ordered Stop   07/25/14 1800  cefTRIAXone (ROCEPHIN) 1 g in dextrose 5 % 50 mL IVPB - Premix     1 g 100 mL/hr over 30 Minutes Intravenous Every 24 hours 07/25/14 0940     07/24/14 1700  cefTRIAXone (ROCEPHIN) 1 g in dextrose 5 % 50 mL IVPB - Premix  Status:  Discontinued     1 g 100 mL/hr over 30 Minutes Intravenous Every 24 hours 07/24/14 1634 07/25/14 0940   07/24/14 1645  clindamycin (CLEOCIN) IVPB 600 mg     600 mg 100 mL/hr over 30 Minutes Intravenous 3 times per day 07/24/14 1634        Assessment/Plan: s/p    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  He feels well today and has minimal pain he is confused about the plan for the patient as he has been told he was going home with IV antibiotics and a PICC line. To be transferred as an outpatient to [facility] at a later date.  I discussed with him his findings on CT scan and the serious nature of this in the postoperative period. He has improved and his white blood cell count is normal he is afebrile and other than  some mild purulence from the caudad portion of his wound is abdominal exam is otherwise benign.  I discussed with him his options of transfer versus discharge versus my preference for continued antibiotic therapy and possibly repeating a CT scan to confirm improvement in his condition either today or tomorrow. The patient wants to go home. He does not favor transfer to [facility] but he is confused as multiple providers have discussed multiple alternatives with him.  My suggestion which he was amenable to, would be to continue IV antibiotics today. Then tomorrow will repeat a CT scan to ensure that the small air bubbles are resolving and or that there is no sign of a leak nor an abscess forming. If those conditions are favorable then he could be discharged tomorrow or the next day with IV antibiotics via the PICC line.  With the patient's degree of confusion over multiple recommendations the patient wishes to discuss this with Dr. Pat Patrick this evening. Dr. Pat Patrick will be on the evening shift starting at 1900 and I will personally review my suggestions as well as the patient's objective findings with Dr. Pat Patrick. But in light of his improvement and his reluctance for transfer, for today we will continue IV antibiotics and discussion of further planning with Dr. Pat Patrick.Florene Glen, MD, FACS  07/26/2014

## 2014-07-27 ENCOUNTER — Inpatient Hospital Stay: Payer: 59

## 2014-07-27 MED ORDER — IOHEXOL 300 MG/ML  SOLN
100.0000 mL | Freq: Once | INTRAMUSCULAR | Status: AC | PRN
  Administered 2014-07-27: 100 mL via INTRAVENOUS

## 2014-07-27 MED ORDER — OXYCODONE-ACETAMINOPHEN 5-325 MG PO TABS: 1.0000 | ORAL_TABLET | ORAL | Status: DC | PRN

## 2014-07-27 MED ORDER — IOHEXOL 240 MG/ML SOLN
25.0000 mL | INTRAMUSCULAR | Status: AC
  Administered 2014-07-27: 25 mL via ORAL

## 2014-07-27 MED ORDER — SODIUM CHLORIDE 0.9 % IV SOLN: 1.0000 g | INTRAVENOUS | Status: AC

## 2014-07-27 MED ORDER — SODIUM CHLORIDE 0.9 % IV SOLN
1.0000 g | INTRAVENOUS | Status: DC
  Administered 2014-07-27: 1 g via INTRAVENOUS
  Filled 2014-07-27 (×2): qty 1

## 2014-07-27 NOTE — Discharge Instructions (Signed)
May shower  Dry dressing to abdominal wound as needed  Home health care for IV antibiotics every 24 hours to be arranged. Next dose due 07/28/2014 in the afternoon.  Follow-up with Dr. Pat Patrick in 1 week or sooner should you experience increased pain or fevers or nausea or vomiting.

## 2014-07-27 NOTE — Progress Notes (Signed)
Patient not on floor currently. Likely in radiology for CT scan. We will see later today and discuss plans.

## 2014-07-27 NOTE — Progress Notes (Signed)
CC: abd pain Subjective: Patient is postop and has air on CT scan. There is suspicion of an intra-abdominal abscess. Patient describes almost no pain at this point and feels well. He does not want to stay in the hospital and wants to be discharged on IV antibiotics. He has a PICC line in place.  Objective: Vital signs in last 24 hours: Temp:  [98.1 F (36.7 C)-98.4 F (36.9 C)] 98.4 F (36.9 C) (05/10 1205) Pulse Rate:  [80-86] 86 (05/10 1205) Resp:  [17-18] 18 (05/10 1205) BP: (121-138)/(77-88) 132/88 mmHg (05/10 1205) SpO2:  [93 %-98 %] 96 % (05/10 1205) Last BM Date: 07/26/14  Intake/Output from previous day: 05/09 0701 - 05/10 0700 In: 2370 [P.O.:760; I.V.:1260; IV Piggyback:350] Out: 0037 [Urine:1550] Intake/Output this shift: Total I/O In: 75 [I.V.:75] Out: -    Physical exam:  Soft nontender abdomen staples are in place retention sutures are in place. Nondistended and nontender. The caudad portion of the wound is draining purulent material and is expressible from the right side of the abdominal wound not the left side. No surrounding erythema and essentially nontender.  Lab Results: CBC   Recent Labs  07/25/14 0803 07/26/14 0632  WBC 8.6 8.5  HGB 13.2 13.0  HCT 39.8* 38.7*  PLT 538* 503*   BMET  Recent Labs  07/25/14 0803 07/26/14 0632  NA 138 137  K 3.7 3.7  CL 101 101  CO2 28 29  GLUCOSE 89 96  BUN 9 8  CREATININE 0.78 0.82  CALCIUM 9.2 9.1   PT/INR  Recent Labs  07/25/14 0803  LABPROT 14.3  INR 1.09   ABG No results for input(s): PHART, HCO3 in the last 72 hours.  Invalid input(s): PCO2, PO2  Studies/Results: Ct Abdomen Pelvis W Contrast  07/27/2014   CLINICAL DATA:  Admitted 07/24/2014 for abdominal pain and free air on CT. Possible bowel perforation. Clinically improving. Recent abdominal abscess. Small bowel surgery 2-3 weeks ago.  EXAM: CT ABDOMEN AND PELVIS WITH CONTRAST  TECHNIQUE: Multidetector CT imaging of the abdomen and  pelvis was performed using the standard protocol following bolus administration of intravenous contrast.  CONTRAST:  162mL OMNIPAQUE IOHEXOL 300 MG/ML  SOLN  COMPARISON:  07/23/2014, 07/06/2014, 07/04/2014, 02/03/2014 and 10/09/2013  FINDINGS: Lung bases demonstrate linear atelectasis/scarring over the left base unchanged.  Abdominal images demonstrate interval worsening amount of free peritoneal air. Skin staples are present over the midline abdominal wall. There is a stable air and fluid collection deep to the midline abdominal wall incision extending into the abdominal cavity measuring approximately 1.7 x 2.7 cm just below level of the umbilicus. There is slight interval enlargement of an air and fluid collection over the anterior left lower quadrant abutting the abdominal wall measuring approximately 2.8 x 5.8 cm likely an abscess. There is extraluminal and mesenteric air over the left lower quadrant and midline lower abdomen intimately associated with several small bowel loops. Again seen is minimal air and oral contrast in an extraluminal location over the left lower quadrant adjacent several small bowel loops likely extravasation. There is mild narrowing and wall thickening of the sigmoid colon as it passes immediately posterior to these small bowel loops. There are no significantly dilated small bowel loops. 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  There are several mildly prominent periaortic lymph nodes unchanged. The liver, spleen, gallbladder, pancreas and adrenal glands are within normal. Kidneys are normal  in size without hydronephrosis or nephrolithiasis. Ureters are within normal. The appendix is normal. There is diverticulosis of the colon.  Remaining pelvic images demonstrate the bladder, prostate and rectum to be within normal. Remainder the exam is unchanged.  IMPRESSION: Worsening  amount of free peritoneal air with moderate collection of extraluminal air adjacent several clustered small bowel loops in the left lower quadrant. Suggestion of contrast extravasation in this area as these findings likely represent ongoing small bowel perforation. Slight increase in size of an air and fluid collection over the left lower quadrant just anterior to the small bowel loops measuring 2.8 x 5.8 cm. Stable air and fluid collection deep to the midline incision at the level of the umbilicus measuring 1.7 x 2.7 cm. These fluid collections likely represent abscesses. No evidence of small bowel obstruction.  These results were called by telephone at the time of interpretation on 07/27/2014 at 12:15 pm to Dr. Burt Knack, who verbally acknowledged these results.   Electronically Signed   By: Marin Olp M.D.   On: 07/27/2014 12:15    Anti-infectives: Anti-infectives    Start     Dose/Rate Route Frequency Ordered Stop   07/27/14 1300  ertapenem (INVANZ) 1 g in sodium chloride 0.9 % 50 mL IVPB     1 g 100 mL/hr over 30 Minutes Intravenous Every 24 hours 07/27/14 1259     07/25/14 1800  cefTRIAXone (ROCEPHIN) 1 g in dextrose 5 % 50 mL IVPB - Premix     1 g 100 mL/hr over 30 Minutes Intravenous Every 24 hours 07/25/14 0940     07/24/14 1700  cefTRIAXone (ROCEPHIN) 1 g in dextrose 5 % 50 mL IVPB - Premix  Status:  Discontinued     1 g 100 mL/hr over 30 Minutes Intravenous Every 24 hours 07/24/14 1634 07/25/14 0940   07/24/14 1645  clindamycin (CLEOCIN) IVPB 600 mg     600 mg 100 mL/hr over 30 Minutes Intravenous 3 times per day 07/24/14 1634        Assessment/Plan: s/p exploratory laparoscopy.   CT scan is personally reviewed and discussed with the radiologist. There seems to be a fluid collection in the left side but it is not expressible through the midline purulent area caudad on the midline wound. My considerations would be that of a fistula with abscess formation.  I discussed with the  patient these findings and I reminded him that my discussion last night centered on a CT scan that if it showed no worsening IE less free air and no fluid collection then he could be discharged on IV antibiotics via his PICC line. But the CT scan shows more free air and a slightly worsened fluid collection. Patient is currently refusing to stay in the hospital and wants to be discharged with home health and IV antibiotics.  I discussed with him my concerns and reticence N discharging him. My suggestion was that I would arrange for home health IV Invanz every 24 via his PICC line. And that I would hold off on discharging him until he could discuss this with Dr. Pat Patrick who is most familiar with him. The patient was amenable to this plan and I have started the paperwork discharge process for home health and IV Invanz every 24.  Florene Glen, MD, FACS  07/27/2014

## 2014-07-27 NOTE — Progress Notes (Signed)
[facility] arranged with [facility] for IV antibiotics. Notified Pamala Hurry, [facility] liaison of anticipated discharge tonight. Next dose due tomorrow with script on the chart. Patient updated on POC. He verbalized understanding and agreement.

## 2014-07-27 NOTE — Discharge Summary (Signed)
Physician Discharge Summary  Patient ID: Roy Valentine MRN: [id number] DOB/AGE: [id number] y.o.  Admit date: 07/24/2014 Discharge date: 07/27/2014   Discharge Diagnoses:  Active Problems:   Pneumoperitoneum   Discharged Condition: Stable  Procedures: PICC line  Hospital Course: This is a patient who is postop presents with abdominal pain and pneumoperitoneum believed to be related to prior anastomosis. He was admitted to the hospital because the physical findings were that of minimal tenderness with a normal white blood cell count and minimal free air on CT scan. He was treated with IV antibiotics and improved. Currently he has almost no pain. However a repeat CT scan shows slightly worsening free air, and a small fluid collection.  He has a PICC line in place and has requested IV antibiotic therapy as an outpatient. The patient does not want to stay in the hospital stating that he feels well and can get his antibiotics at home with home health.  This discharge summary is currently contingent on Dr. Pat Patrick agreeing with this plan for IV Invanz every 24 hours. If the patient is discharged this evening evening by Dr. Pat Patrick, the patient will follow up next week with Dr. Pat Patrick for wound check and possible repeat CT scan.  I discussed with the patient my concerns about staying in the hospital but he is adamant about being discharged. I reviewed for him findings of worsening pain, fevers, or nausea and vomiting. Any of these would necessitate return to the emergency room immediately.    Significant Diagnostic Studies: CT scan   Disposition: 01-Home or Self Care  Discharge Instructions    Skilled nursing visits    Complete by:  As directed   [place] for IV Invanz Q24H via PICC            Medication List    STOP taking these medications        oxyCODONE-acetaminophen 5-325 MG/5ML solution  Commonly known as:  ROXICET  Replaced by:  oxyCODONE-acetaminophen 5-325 MG per tablet       TAKE these medications        aspirin 81 MG chewable tablet  Chew 81 mg by mouth daily.     ertapenem 1 g in sodium chloride 0.9 % 50 mL  Inject 1 g into the vein daily.     oxyCODONE-acetaminophen 5-325 MG per tablet  Commonly known as:  ROXICET  Take 1 tablet by mouth every 4 (four) hours as needed for moderate pain or severe pain.           Follow-up Information    Follow up with ELY III, RALPH, MD. Schedule an appointment as soon as possible for a visit in 1 week.   Specialty:  Surgery   Why:  For wound re-check   Contact information:   [address] [place] [id number] [phone]       Florene Glen, MD, FACS

## 2014-09-09 ENCOUNTER — Ambulatory Visit: Payer: Self-pay | Admitting: Surgery

## 2015-09-09 DIAGNOSIS — B360 Pityriasis versicolor: Secondary | ICD-10-CM | POA: Insufficient documentation

## 2015-09-09 DIAGNOSIS — N529 Male erectile dysfunction, unspecified: Secondary | ICD-10-CM | POA: Insufficient documentation

## 2015-09-09 DIAGNOSIS — Z8719 Personal history of other diseases of the digestive system: Secondary | ICD-10-CM

## 2015-09-09 DIAGNOSIS — Z720 Tobacco use: Secondary | ICD-10-CM | POA: Insufficient documentation

## 2015-09-09 HISTORY — DX: Personal history of other diseases of the digestive system: Z87.19

## 2015-12-23 IMAGING — CT CT ABD-PELV W/ CM
1 of 3 series · 13 of 32 positions shown, 18 images · IV contrast (omnipaque)
Comparison: 07/23/2014, 07/06/2014, 07/04/2014, 02/03/2014 and
10/09/2013

CLINICAL DATA: Admitted 07/24/2014 for abdominal pain and free air
on CT. Possible bowel perforation. Clinically improving. Recent
abdominal abscess. Small bowel surgery 2-3 weeks ago.

EXAM:
CT ABDOMEN AND PELVIS WITH CONTRAST
TECHNIQUE: Multidetector CT imaging of the abdomen and pelvis was performed
using the standard protocol following bolus administration of
intravenous contrast.
CONTRAST:  100mL OMNIPAQUE IOHEXOL 300 MG/ML  SOLN

[Series 2: routine abd pel with · axial · 0.72mm/px · z∈[-317,+108]mm · 13 of 97 slices shown, 18 images]
[im 6/97  soft-tissue]
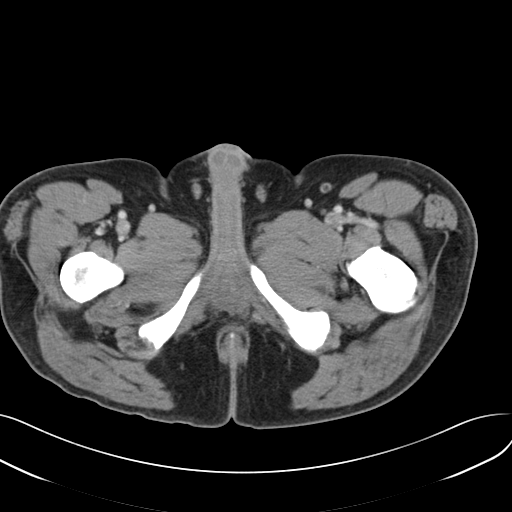
[im 6/97  bone]
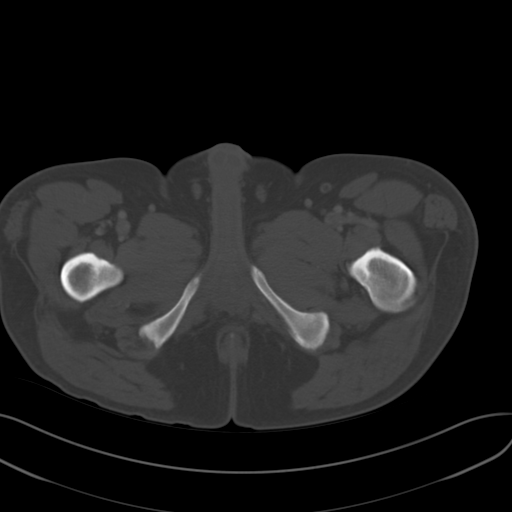
[im 17/97  soft-tissue]
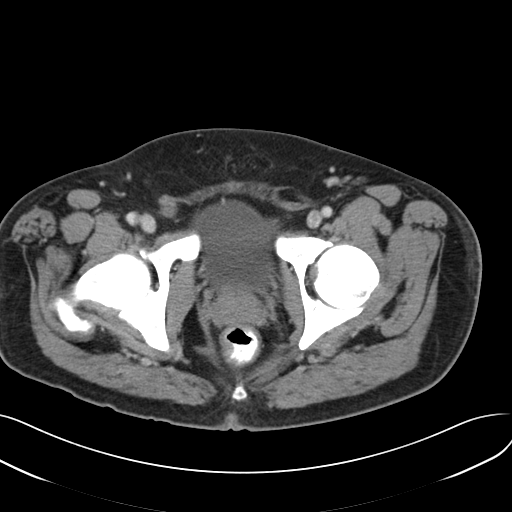
[im 22/97  soft-tissue]
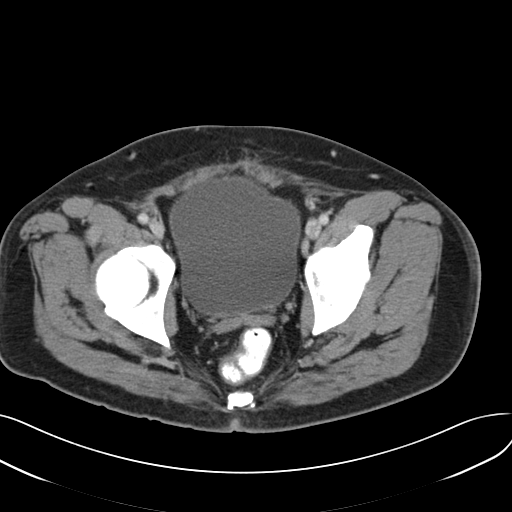
[im 27/97  soft-tissue]
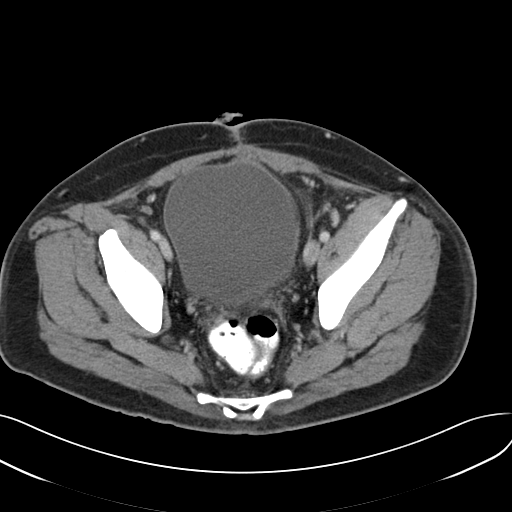
[im 38/97  soft-tissue]
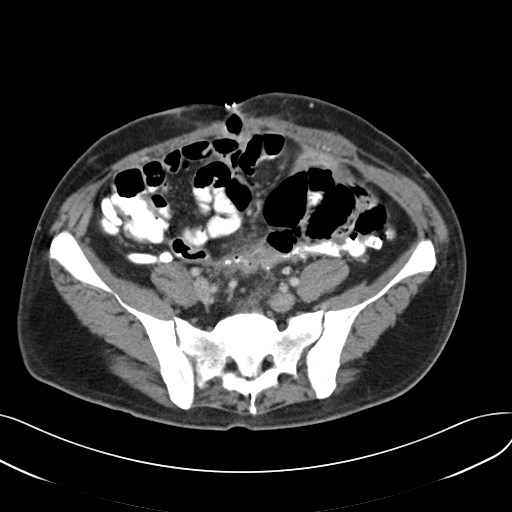
[im 43/97  soft-tissue]
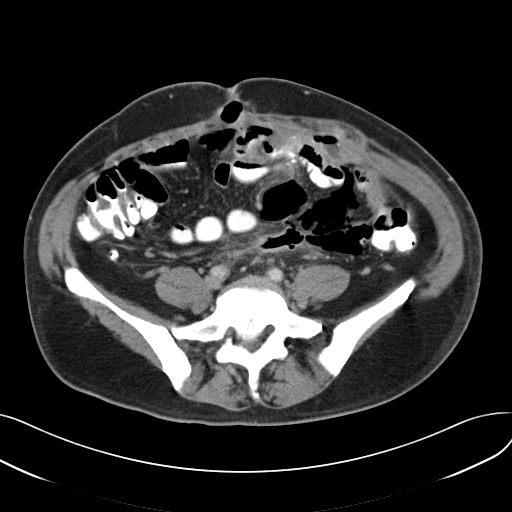
[im 54/97  soft-tissue]
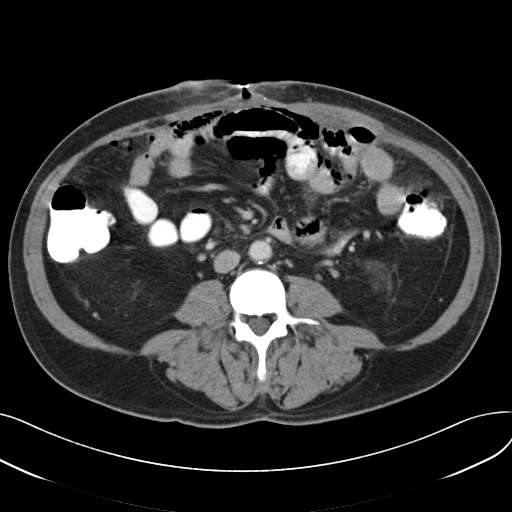
[im 59/97  soft-tissue]
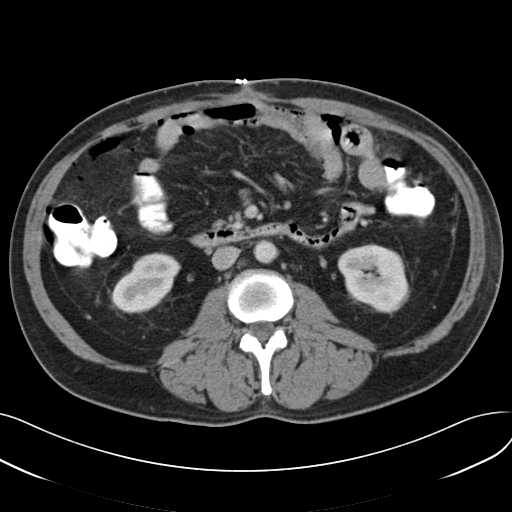
[im 70/97  soft-tissue]
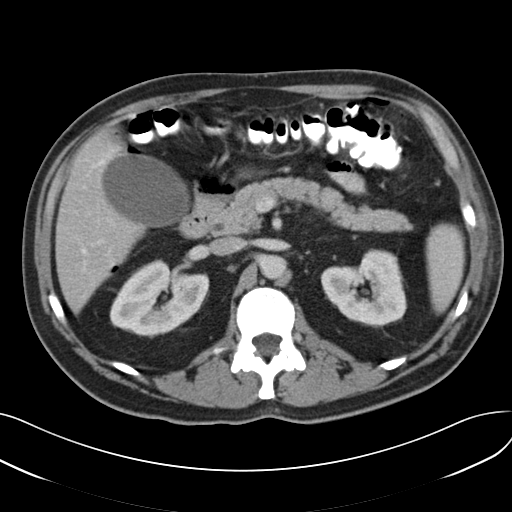
[im 70/97  bone]
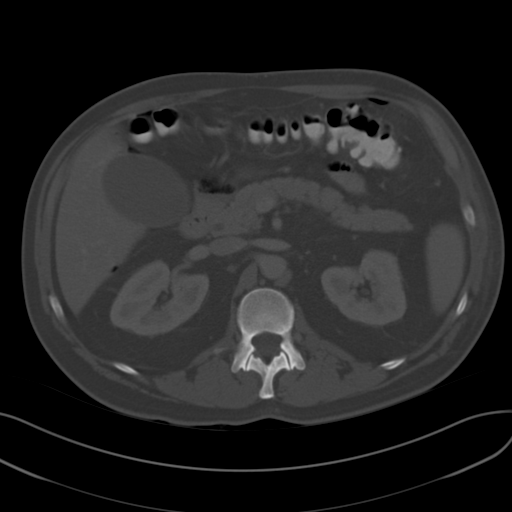
[im 75/97  soft-tissue]
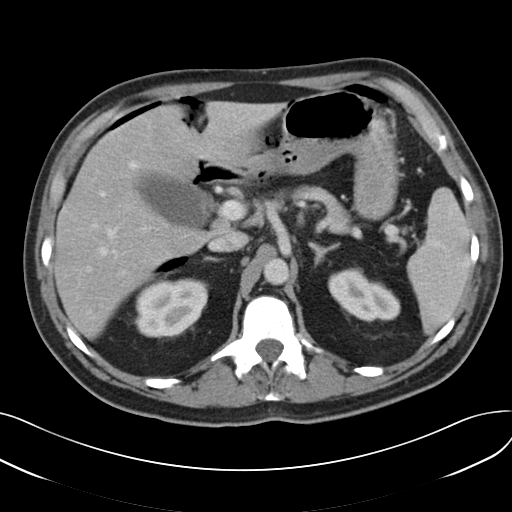
[im 75/97  lung]
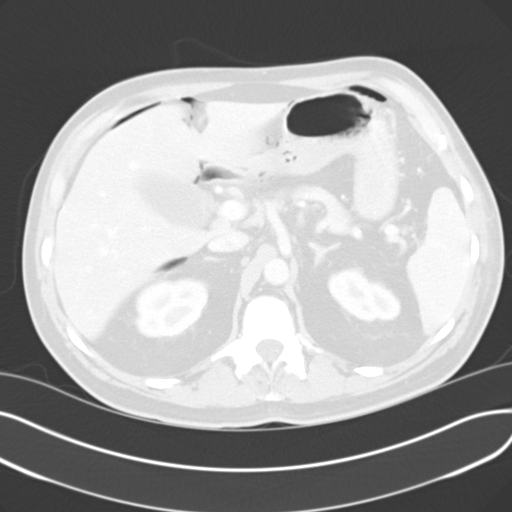
[im 81/97  soft-tissue]
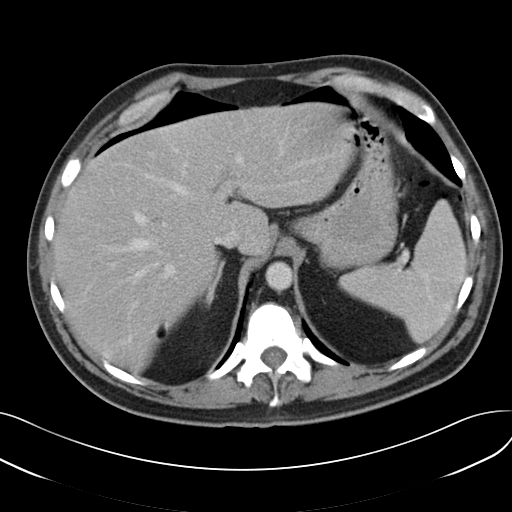
[im 81/97  lung]
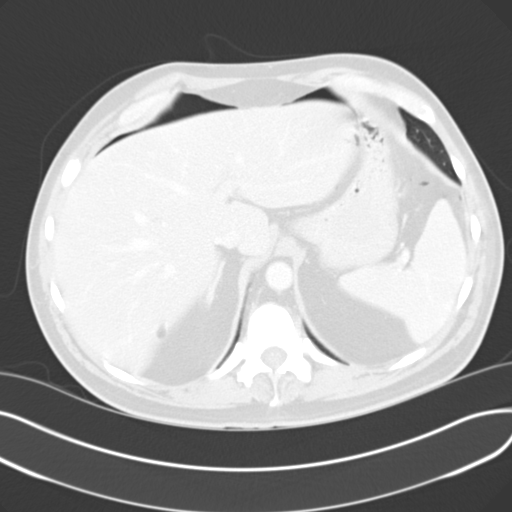
[im 86/97  lung]
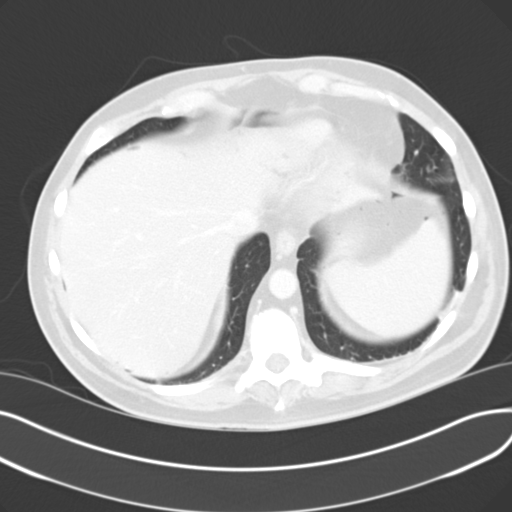
[im 91/97  soft-tissue]
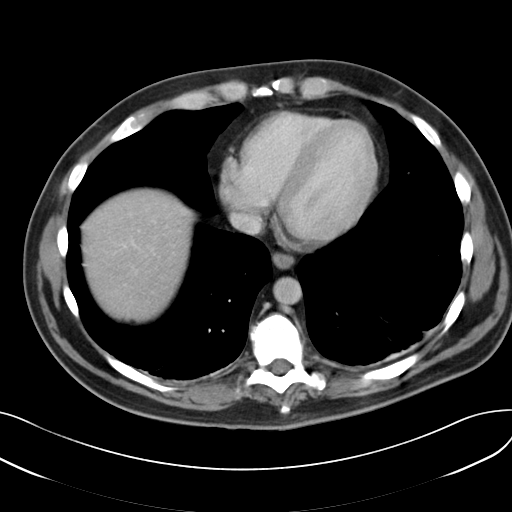
[im 91/97  lung]
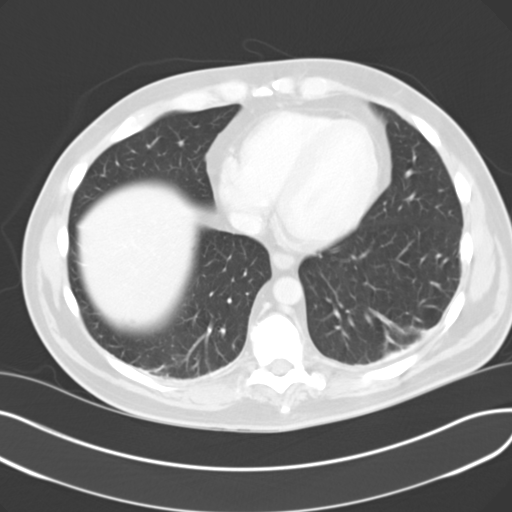

[13 of 32 positions shown; findings below may reference images not displayed]

FINDINGS: Lung bases demonstrate linear atelectasis/scarring over the left
base unchanged.

Abdominal images demonstrate interval worsening amount of free
peritoneal air. Skin staples are present over the midline abdominal
wall. There is a stable air and fluid collection deep to the midline
abdominal wall incision extending into the abdominal cavity
measuring approximately 1.7 x 2.7 cm just below level of the
umbilicus. There is slight interval enlargement of an air and fluid
collection over the anterior left lower quadrant abutting the
abdominal wall measuring approximately 2.8 x 5.8 cm likely an
abscess. There is extraluminal and mesenteric air over the left
lower quadrant and midline lower abdomen intimately associated with
several small bowel loops. Again seen is minimal air and oral
contrast in an extraluminal location over the left lower quadrant
adjacent several small bowel loops likely extravasation. There is
mild narrowing and wall thickening of the sigmoid colon as it passes
immediately posterior to these small bowel loops. There are no
significantly dilated small bowel loops. Prior exams demonstrated
colostomy site over the left lower quadrant and Hartmann's pouch as
patient has since had colostomy takedown and reanastomosis.

There are several mildly prominent periaortic lymph nodes unchanged.
The liver, spleen, gallbladder, pancreas and adrenal glands are
within normal. Kidneys are normal in size without hydronephrosis or
nephrolithiasis. Ureters are within normal. The appendix is normal.
There is diverticulosis of the colon.

Remaining pelvic images demonstrate the bladder, prostate and rectum
to be within normal. Remainder the exam is unchanged.
IMPRESSION: Worsening amount of free peritoneal air with moderate collection of
extraluminal air adjacent several clustered small bowel loops in the
left lower quadrant. Suggestion of contrast extravasation in this
area as these findings likely represent ongoing small bowel
perforation. Slight increase in size of an air and fluid collection
over the left lower quadrant just anterior to the small bowel loops
measuring 2.8 x 5.8 cm. Stable air and fluid collection deep to the
midline incision at the level of the umbilicus measuring 1.7 x
cm. These fluid collections likely represent abscesses. No evidence
of small bowel obstruction.

These results were called by telephone at the time of interpretation
on 07/27/2014 at [DATE] to Dr. Yuan, who verbally acknowledged
these results.

## 2017-08-12 ENCOUNTER — Emergency Department
Admission: EM | Admit: 2017-08-12 | Discharge: 2017-08-12 | Disposition: A | Discharge status: Home / Self Care | Payer: BLUE CROSS/BLUE SHIELD | Attending: Emergency Medicine | Admitting: Emergency Medicine

## 2017-08-12 ENCOUNTER — Other Ambulatory Visit: Payer: Self-pay

## 2017-08-12 ENCOUNTER — Encounter: Payer: Self-pay | Admitting: Emergency Medicine

## 2017-08-12 ENCOUNTER — Emergency Department: Payer: BLUE CROSS/BLUE SHIELD

## 2017-08-12 DIAGNOSIS — R05 Cough: Secondary | ICD-10-CM | POA: Diagnosis present

## 2017-08-12 DIAGNOSIS — J189 Pneumonia, unspecified organism: Secondary | ICD-10-CM

## 2017-08-12 DIAGNOSIS — Z7982 Long term (current) use of aspirin: Secondary | ICD-10-CM | POA: Insufficient documentation

## 2017-08-12 LAB — COMPREHENSIVE METABOLIC PANEL
ALK PHOS: 66 U/L (ref 38–126)
ALT: 22 U/L (ref 17–63)
AST: 21 U/L (ref 15–41)
Albumin: 3.7 g/dL (ref 3.5–5.0)
Anion gap: 13 (ref 5–15)
BUN: 14 mg/dL (ref 6–20)
CALCIUM: 9.4 mg/dL (ref 8.9–10.3)
CO2: 26 mmol/L (ref 22–32)
CREATININE: 1.15 mg/dL (ref 0.61–1.24)
Chloride: 94 mmol/L — ABNORMAL LOW (ref 101–111)
Glucose, Bld: 154 mg/dL — ABNORMAL HIGH (ref 65–99)
Potassium: 2.9 mmol/L — ABNORMAL LOW (ref 3.5–5.1)
Sodium: 133 mmol/L — ABNORMAL LOW (ref 135–145)
Total Bilirubin: 1.3 mg/dL — ABNORMAL HIGH (ref 0.3–1.2)
Total Protein: 8.9 g/dL — ABNORMAL HIGH (ref 6.5–8.1)

## 2017-08-12 LAB — LACTIC ACID, PLASMA: LACTIC ACID, VENOUS: 1.3 mmol/L (ref 0.5–1.9)

## 2017-08-12 LAB — CBC
HCT: 46.7 % (ref 40.0–52.0)
HEMOGLOBIN: 16 g/dL (ref 13.0–18.0)
MCH: 35.4 pg — ABNORMAL HIGH (ref 26.0–34.0)
MCHC: 34.3 g/dL (ref 32.0–36.0)
MCV: 103.2 fL — ABNORMAL HIGH (ref 80.0–100.0)
Platelets: 247 10*3/uL (ref 150–440)
RBC: 4.52 MIL/uL (ref 4.40–5.90)
RDW: 15.5 % — ABNORMAL HIGH (ref 11.5–14.5)
WBC: 1.7 10*3/uL — ABNORMAL LOW (ref 3.8–10.6)

## 2017-08-12 MED ORDER — AMOXICILLIN 500 MG PO TABS: 1000.0000 mg | ORAL_TABLET | Freq: Two times a day (BID) | ORAL | 0 refills | Status: AC

## 2017-08-12 MED ORDER — AZITHROMYCIN 500 MG PO TABS
500.0000 mg | ORAL_TABLET | Freq: Once | ORAL | Status: AC
  Administered 2017-08-12: 500 mg via ORAL
  Filled 2017-08-12: qty 1

## 2017-08-12 MED ORDER — AMOXICILLIN 500 MG PO CAPS
1000.0000 mg | ORAL_CAPSULE | Freq: Once | ORAL | Status: AC
  Administered 2017-08-12: 1000 mg via ORAL
  Filled 2017-08-12: qty 2

## 2017-08-12 MED ORDER — IBUPROFEN 600 MG PO TABS
600.0000 mg | ORAL_TABLET | Freq: Once | ORAL | Status: AC
  Administered 2017-08-12: 600 mg via ORAL
  Filled 2017-08-12: qty 1

## 2017-08-12 MED ORDER — IPRATROPIUM-ALBUTEROL 0.5-2.5 (3) MG/3ML IN SOLN
3.0000 mL | Freq: Once | RESPIRATORY_TRACT | Status: AC
  Administered 2017-08-12: 3 mL via RESPIRATORY_TRACT
  Filled 2017-08-12: qty 3

## 2017-08-12 MED ORDER — AZITHROMYCIN 250 MG PO TABS: ORAL_TABLET | ORAL | 0 refills | Status: DC

## 2017-08-12 NOTE — ED Triage Notes (Signed)
Pt arrived to the ED for complaints of flu like symptoms for the past 6 days unrelieved by over the counter medication. Pt states that everything started like a cold and it got progressively worse with fever. Pt is AOx4 in no apparent distress.

## 2017-08-12 NOTE — ED Provider Notes (Signed)
[facility] Emergency Department Provider Note  ____________________________________________   First MD Initiated Contact with Patient 08/12/17 [phone]     (approximate)  I have reviewed the triage vital signs and the nursing notes.   HISTORY  Chief Complaint Influenza and Cough   HPI Roy Valentine is a 47 y.o. male self presents to the emergency department with 6 days of cough coryza and fever.  His symptoms began as what he thought was an upper respiratory tract infection with congestion then rhinorrhea, dry cough.  His cough has progressed and is now productive.  Associated with fevers and chills.  He is a cigarette smoker.  He has no recent hospitalizations.  He was concerned he could have influenza.  His symptoms are now moderate severity.  His chest pain is sharp diffuse upper nonradiating worse with coughing improved with rest.  History reviewed. No pertinent past medical history.  Patient Active Problem List   Diagnosis Date Noted  . Pneumoperitoneum 07/24/2014    History reviewed. No pertinent surgical history.  Prior to Admission medications   Medication Sig Start Date End Date Taking? Authorizing Provider  amoxicillin (AMOXIL) 500 MG tablet Take 2 tablets (1,000 mg total) by mouth 2 (two) times daily for 7 days. 08/12/17 08/19/17  Darel Hong, MD  aspirin 81 MG chewable tablet Chew 81 mg by mouth daily.    [provider]  azithromycin (ZITHROMAX Z-PAK) 250 MG tablet Take 2 tablets (500 mg) on  Day 1,  followed by 1 tablet (250 mg) once daily on Days 2 through 5. 08/12/17   Darel Hong, MD  oxyCODONE-acetaminophen (ROXICET) 5-325 MG per tablet Take 1 tablet by mouth every 4 (four) hours as needed for moderate pain or severe pain. 07/27/14   Florene Glen, MD    Allergies Patient has no known allergies.  History reviewed. No pertinent family history.  Social History Social History   Tobacco Use  . Smoking status:  Never Smoker  . Smokeless tobacco: Never Used  Substance Use Topics  . Alcohol use: Not on file  . Drug use: Not on file    Review of Systems Constitutional: Positive for fevers Eyes: No visual changes. ENT: No sore throat. Cardiovascular: Positive for chest pain. Respiratory: Positive for shortness of breath. Gastrointestinal: No abdominal pain.  No nausea, no vomiting.  No diarrhea.  No constipation. Genitourinary: Negative for dysuria. Musculoskeletal: Negative for back pain. Skin: Negative for rash. Neurological: Negative for headaches, focal weakness or numbness.   ____________________________________________   PHYSICAL EXAM:  VITAL SIGNS: ED Triage Vitals  Enc Vitals Group     BP 08/12/17 1902 (!) 157/92     Pulse Rate 08/12/17 1902 (!) 144     Resp 08/12/17 1902 20     Temp 08/12/17 1902 (!) 101 F (38.3 C)     Temp Source 08/12/17 1902 Oral     SpO2 08/12/17 1902 95 %     Weight 08/12/17 1903 195 lb (88.5 kg)     Height 08/12/17 1903 5\' 9"  (1.753 m)     Head Circumference --      Peak Flow --      Pain Score 08/12/17 1907 6     Pain Loc --      Pain Edu? --      Excl. in [place]? --     Constitutional: Alert and oriented x4 appears somewhat uncomfortable nontoxic no diaphoresis Eyes: PERRL EOMI. Head: Atraumatic. Nose: Positive for congestion Mouth/Throat: No  trismus Neck: No stridor.   Cardiovascular: Tachycardic rate, regular rhythm. Grossly normal heart sounds.  Good peripheral circulation. Respiratory: Very minimally increased respiratory effort with crackles bilaterally although moving good air Gastrointestinal: Soft nontender Musculoskeletal: No lower extremity edema   Neurologic:  Normal speech and language. No gross focal neurologic deficits are appreciated. Skin:  Skin is warm, dry and intact. No rash noted. Psychiatric: Anxious appearing    ____________________________________________   DIFFERENTIAL includes but not limited to  Influenza,  influenza-like illness, pneumonia, post viral syndrome ____________________________________________   LABS (all labs ordered are listed, but only abnormal results are displayed)  Labs Reviewed  CBC - Abnormal; Notable for the following components:      Result Value   WBC 1.7 (*)    MCV 103.2 (*)    MCH 35.4 (*)    RDW 15.5 (*)    All other components within normal limits  COMPREHENSIVE METABOLIC PANEL - Abnormal; Notable for the following components:   Sodium 133 (*)    Potassium 2.9 (*)    Chloride 94 (*)    Glucose, Bld 154 (*)    Total Protein 8.9 (*)    Total Bilirubin 1.3 (*)    All other components within normal limits  LACTIC ACID, PLASMA  LACTIC ACID, PLASMA    Lab work reviewed by me with low white count which is consistent with infection __________________________________________  EKG   ____________________________________________  RADIOLOGY  Chest x-ray reviewed by me consistent with pneumonia ____________________________________________   PROCEDURES  Procedure(s) performed: no  Procedures  Critical Care performed: no  Observation: no ____________________________________________   INITIAL IMPRESSION / ASSESSMENT AND PLAN / ED COURSE  Pertinent labs & imaging results that were available during my care of the patient were reviewed by me and considered in my medical decision making (see chart for details).  I appreciate that the patient is somewhat tachycardic although he is febrile and anxious appearing.  He has crackles throughout.  Feels somewhat improved after breathing treatment.  He does not appear septic.  Chest x-ray is consistent with infiltrate and bacterial pneumonia.  Given a first dose of azithromycin and amoxicillin here today and I do believe the patient is appropriate for outpatient management given his normal respiratory effort as well as oxygen saturation.  Strict return precautions have been given and the patient verbalized  understanding and agree with plan.      ____________________________________________   FINAL CLINICAL IMPRESSION(S) / ED DIAGNOSES  Final diagnoses:  Community acquired pneumonia of right lung, unspecified part of lung      NEW MEDICATIONS STARTED DURING THIS VISIT:  Discharge Medication List as of 08/12/2017  8:02 PM    START taking these medications   Details  amoxicillin (AMOXIL) 500 MG tablet Take 2 tablets (1,000 mg total) by mouth 2 (two) times daily for 7 days., Starting Mon 08/12/2017, Until Mon 08/19/2017, Print    azithromycin (ZITHROMAX Z-PAK) 250 MG tablet Take 2 tablets (500 mg) on  Day 1,  followed by 1 tablet (250 mg) once daily on Days 2 through 5., Print         Note:  This document was prepared using Dragon voice recognition software and may include unintentional dictation errors.     Darel Hong, MD 08/12/17 2237

## 2017-08-12 NOTE — Discharge Instructions (Signed)
It was a pleasure to take care of you today, and thank you for coming to our emergency department.  If you have any questions or concerns before leaving please ask the nurse to grab me and I'm more than happy to go through your aftercare instructions again.  If you were prescribed any opioid pain medication today such as Norco, Vicodin, Percocet, morphine, hydrocodone, or oxycodone please make sure you do not drive when you are taking this medication as it can alter your ability to drive safely.  If you have any concerns once you are home that you are not improving or are in fact getting worse before you can make it to your follow-up appointment, please do not hesitate to call 911 and come back for further evaluation.  Darel Hong, MD  Results for orders placed or performed during the hospital encounter of 08/12/17  Comprehensive metabolic panel  Result Value Ref Range   Sodium 133 (L) 135 - 145 mmol/L   Potassium 2.9 (L) 3.5 - 5.1 mmol/L   Chloride 94 (L) 101 - 111 mmol/L   CO2 26 22 - 32 mmol/L   Glucose, Bld 154 (H) 65 - 99 mg/dL   BUN 14 6 - 20 mg/dL   Creatinine, Ser 1.15 0.61 - 1.24 mg/dL   Calcium 9.4 8.9 - 10.3 mg/dL   Total Protein 8.9 (H) 6.5 - 8.1 g/dL   Albumin 3.7 3.5 - 5.0 g/dL   AST 21 15 - 41 U/L   ALT 22 17 - 63 U/L   Alkaline Phosphatase 66 38 - 126 U/L   Total Bilirubin 1.3 (H) 0.3 - 1.2 mg/dL   GFR calc non Af Amer >60 >60 mL/min   GFR calc Af Amer >60 >60 mL/min   Anion gap 13 5 - 15  Lactic acid, plasma  Result Value Ref Range   Lactic Acid, Venous 1.3 0.5 - 1.9 mmol/L   Dg Chest 2 View  Result Date: 08/12/2017 CLINICAL DATA:  Acute onset of cough and fever. EXAM: CHEST - 2 VIEW COMPARISON:  Chest radiograph performed 07/24/2014 FINDINGS: The lungs are well-aerated. Mild right basilar airspace opacity is concerning for pneumonia. There is no evidence of pleural effusion or pneumothorax. The heart is normal in size; the mediastinal contour is within normal  limits. No acute osseous abnormalities are seen. IMPRESSION: Mild right basilar airspace opacity is concerning for pneumonia. Electronically Signed   By: Garald Balding M.D.   On: 08/12/2017 19:24

## 2018-05-07 ENCOUNTER — Other Ambulatory Visit: Payer: Self-pay | Admitting: Oncology

## 2018-05-07 ENCOUNTER — Inpatient Hospital Stay: Payer: BLUE CROSS/BLUE SHIELD | Admitting: Oncology

## 2018-05-07 ENCOUNTER — Inpatient Hospital Stay: Payer: BLUE CROSS/BLUE SHIELD | Attending: Oncology | Admitting: Oncology

## 2018-05-07 ENCOUNTER — Other Ambulatory Visit: Payer: Self-pay

## 2018-05-07 ENCOUNTER — Encounter: Payer: Self-pay | Admitting: Oncology

## 2018-05-07 VITALS — BP 118/80 | HR 111 | Temp 98.0°F | Resp 18 | Ht 69.0 in | Wt 185.5 lb

## 2018-05-07 DIAGNOSIS — D709 Neutropenia, unspecified: Secondary | ICD-10-CM | POA: Diagnosis not present

## 2018-05-07 DIAGNOSIS — D61818 Other pancytopenia: Secondary | ICD-10-CM

## 2018-05-07 DIAGNOSIS — E119 Type 2 diabetes mellitus without complications: Secondary | ICD-10-CM | POA: Insufficient documentation

## 2018-05-07 DIAGNOSIS — D649 Anemia, unspecified: Secondary | ICD-10-CM

## 2018-05-07 DIAGNOSIS — R779 Abnormality of plasma protein, unspecified: Secondary | ICD-10-CM | POA: Diagnosis not present

## 2018-05-07 DIAGNOSIS — I10 Essential (primary) hypertension: Secondary | ICD-10-CM | POA: Insufficient documentation

## 2018-05-07 LAB — SAMPLE TO BLOOD BANK

## 2018-05-07 LAB — CBC WITH DIFFERENTIAL/PLATELET
Abs Immature Granulocytes: 0.01 10*3/uL (ref 0.00–0.07)
BASOS PCT: 1 %
Basophils Absolute: 0 10*3/uL (ref 0.0–0.1)
Eosinophils Absolute: 0 10*3/uL (ref 0.0–0.5)
Eosinophils Relative: 1 %
HEMATOCRIT: 21.9 % — AB (ref 39.0–52.0)
Hemoglobin: 7.3 g/dL — ABNORMAL LOW (ref 13.0–17.0)
Immature Granulocytes: 1 %
LYMPHS PCT: 84 %
Lymphs Abs: 1.2 10*3/uL (ref 0.7–4.0)
MCH: 34.8 pg — ABNORMAL HIGH (ref 26.0–34.0)
MCHC: 33.3 g/dL (ref 30.0–36.0)
MCV: 104.3 fL — AB (ref 80.0–100.0)
MONOS PCT: 3 %
Monocytes Absolute: 0 10*3/uL — ABNORMAL LOW (ref 0.1–1.0)
NEUTROS ABS: 0.1 10*3/uL — AB (ref 1.7–7.7)
NEUTROS PCT: 10 %
PLATELETS: 67 10*3/uL — AB (ref 150–400)
RBC: 2.1 MIL/uL — AB (ref 4.22–5.81)
RDW: 19.8 % — AB (ref 11.5–15.5)
SMEAR REVIEW: DECREASED
WBC: 1.5 10*3/uL — AB (ref 4.0–10.5)
nRBC: 0 % (ref 0.0–0.2)

## 2018-05-07 LAB — COMPREHENSIVE METABOLIC PANEL
ALBUMIN: 4.5 g/dL (ref 3.5–5.0)
ALT: 26 U/L (ref 0–44)
ANION GAP: 10 (ref 5–15)
AST: 28 U/L (ref 15–41)
Alkaline Phosphatase: 51 U/L (ref 38–126)
BILIRUBIN TOTAL: 0.6 mg/dL (ref 0.3–1.2)
BUN: 15 mg/dL (ref 6–20)
CHLORIDE: 103 mmol/L (ref 98–111)
CO2: 25 mmol/L (ref 22–32)
Calcium: 9.5 mg/dL (ref 8.9–10.3)
Creatinine, Ser: 0.96 mg/dL (ref 0.61–1.24)
GFR calc Af Amer: >60 mL/min (ref >60)
GFR calc non Af Amer: >60 mL/min (ref >60)
GLUCOSE: 149 mg/dL — AB (ref 70–99)
POTASSIUM: 3.5 mmol/L (ref 3.5–5.1)
SODIUM: 138 mmol/L (ref 135–145)
TOTAL PROTEIN: 8.3 g/dL — AB (ref 6.5–8.1)

## 2018-05-07 LAB — FOLATE: Folate: 13.3 ng/mL (ref >5.9)

## 2018-05-07 LAB — IRON AND TIBC
Iron: 158 ug/dL (ref 45–182)
Saturation Ratios: 39 % (ref 17.9–39.5)
TIBC: 403 ug/dL (ref 250–450)
UIBC: 245 ug/dL

## 2018-05-07 LAB — LACTATE DEHYDROGENASE: LDH: 139 U/L (ref 98–192)

## 2018-05-07 LAB — RETIC PANEL
IMMATURE RETIC FRACT: 14.4 % (ref 2.3–15.9)
RBC.: 2.1 MIL/uL — ABNORMAL LOW (ref 4.22–5.81)
RETIC COUNT ABSOLUTE: 18.5 10*3/uL — AB (ref 19.0–186.0)
RETICULOCYTE HEMOGLOBIN: 36.8 pg (ref >27.9)
Retic Ct Pct: 0.9 % (ref 0.4–3.1)

## 2018-05-07 LAB — VITAMIN B12: VITAMIN B 12: 446 pg/mL (ref 180–914)

## 2018-05-07 LAB — FERRITIN: Ferritin: 634 ng/mL — ABNORMAL HIGH (ref 24–336)

## 2018-05-07 LAB — TECHNOLOGIST SMEAR REVIEW

## 2018-05-07 MED ORDER — CIPROFLOXACIN HCL 500 MG PO TABS: 500.0000 mg | ORAL_TABLET | Freq: Every day | ORAL | 0 refills | Status: DC

## 2018-05-07 NOTE — Progress Notes (Signed)
Patient here for initial visit. Complains of daily headaches. He states he gets tired easily and has no energy.

## 2018-05-07 NOTE — Progress Notes (Signed)
Hematology/Oncology Consult note [facility] [phone] [phone]   Patient Care Team: Tracie Harrier, MD as PCP - General (Internal Medicine)  REFERRING PROVIDER: Dr.Khan. CHIEF COMPLAINTS/REASON FOR VISIT:  Evaluation of pancytopenia.   HISTORY OF PRESENTING ILLNESS:  Roy Valentine is a  48 y.o.  male with PMH listed below who was referred to me for evaluation of pancytopenia.  Patient recently follows up with pcp and had lab work up done.  Cbc showed hemoglobin of 7, leukopenia with ANC 0.1, thrombocytopenia with platelet count   Fatigue: reports worsening fatigue. Chronic onset, perisistent, for the past few months. no aggravating or improving factors, no associated symptoms.  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   He has ED visit on 08/12/2017 due to cough and fever. CXR showed pneumonia.  Cbc on 08/12/2017 showed wbc 1.7, hemoglobin 16, platelet 247,000.  No differential was checked at that time. Patient was given a course of antibiotics and per patient his symptoms improved.   Denies hematochezia, hematuria, hematemesis, epistaxis, black tarry stool or easy bruising.  Denies fever or chills, any recent frequent infections.  He has lost about 10 pounds since May 2019. Appetite is fair.  Review of Systems  Constitutional: Positive for fatigue and unexpected weight change. Negative for appetite change, chills and fever.  HENT:   Negative for hearing loss and voice change.   Eyes: Negative for eye problems and icterus.  Respiratory: Negative for chest tightness, cough and shortness of breath.   Cardiovascular: Negative for chest pain and leg swelling.  Gastrointestinal: Negative for abdominal distention and abdominal pain.  Endocrine: Negative for hot flashes.  Genitourinary: Negative for difficulty urinating, dysuria and frequency.   Musculoskeletal: Negative for arthralgias.  Skin: Negative for itching and  rash.  Neurological: Negative for light-headedness and numbness.  Hematological: Negative for adenopathy. Does not bruise/bleed easily.  Psychiatric/Behavioral: Negative for confusion.    MEDICAL HISTORY:  Past Medical History:  Diagnosis Date  . Diabetes mellitus without complication ([place])   . Hypertension     SURGICAL HISTORY: Past Surgical History:  Procedure Laterality Date  . COLON SURGERY      SOCIAL HISTORY: Social History   Socioeconomic History  . Marital status: Single    Spouse name: Not on file  . Number of children: Not on file  . Years of education: Not on file  . Highest education level: Not on file  Occupational History  . Not on file  Social Needs  . Financial resource strain: Not on file  . Food insecurity:    Worry: Not on file    Inability: Not on file  . Transportation needs:    Medical: Not on file    Non-medical: Not on file  Tobacco Use  . Smoking status: Current Every Day Smoker    Packs/day: 1.00    Years: 30.00    Pack years: 30.00  . Smokeless tobacco: Never Used  Substance and Sexual Activity  . Alcohol use: Never    Frequency: Never  . Drug use: Yes    Types: Marijuana    Comment: smoked marijuana in college  . Sexual activity: Not on file  Lifestyle  . Physical activity:    Days per week: Not on file    Minutes per session: Not on file  . Stress: Not on file  Relationships  . Social connections:    Talks on phone: Not on file    Gets together: Not on file  Attends religious service: Not on file    Active member of club or organization: Not on file    Attends meetings of clubs or organizations: Not on file    Relationship status: Not on file  . Intimate partner violence:    Fear of current or ex partner: Not on file    Emotionally abused: Not on file    Physically abused: Not on file    Forced sexual activity: Not on file  Other Topics Concern  . Not on file  Social History Narrative  . Not on file    FAMILY  HISTORY: Family History  Problem Relation Age of Onset  . Thyroid disease Mother   . Alzheimer's disease Mother     ALLERGIES:  has No Known Allergies.  MEDICATIONS:  Current Outpatient Medications  Medication Sig Dispense Refill  . buPROPion (WELLBUTRIN XL) 150 MG 24 hr tablet Take 150 mg by mouth daily.    Marland Kitchen JARDIANCE 10 MG TABS tablet Take 10 mg by mouth daily.    Marland Kitchen lisinopril (PRINIVIL,ZESTRIL) 5 MG tablet Take 5 mg by mouth daily.    . Vitamin D, Ergocalciferol, (DRISDOL) 1.25 MG (50000 UT) CAPS capsule Take 50,000 Units by mouth once a week.     No current facility-administered medications for this visit.      PHYSICAL EXAMINATION: ECOG PERFORMANCE STATUS: 1 - Symptomatic but completely ambulatory Vitals:   05/07/18 1515  BP: 118/80  Pulse: (!) 111  Resp: 18  Temp: 98 F (36.7 C)   Filed Weights   05/07/18 1515  Weight: 185 lb 8 oz (84.1 kg)    Physical Exam Constitutional:      General: He is not in acute distress. HENT:     Head: Normocephalic and atraumatic.  Eyes:     General: No scleral icterus.    Pupils: Pupils are equal, round, and reactive to light.  Neck:     Musculoskeletal: Normal range of motion and neck supple.  Cardiovascular:     Rate and Rhythm: Normal rate and regular rhythm.     Heart sounds: Normal heart sounds.  Pulmonary:     Effort: Pulmonary effort is normal. No respiratory distress.     Breath sounds: No wheezing.  Abdominal:     General: Bowel sounds are normal. There is no distension.     Palpations: Abdomen is soft. There is no mass.     Tenderness: There is no abdominal tenderness.  Musculoskeletal: Normal range of motion.        General: No deformity.  Skin:    General: Skin is warm and dry.     Coloration: Skin is pale.     Findings: No erythema or rash.  Neurological:     Mental Status: He is alert and oriented to person, place, and time.     Cranial Nerves: No cranial nerve deficit.     Coordination: Coordination  normal.  Psychiatric:        Behavior: Behavior normal.        Thought Content: Thought content normal.     RADIOGRAPHIC STUDIES: I have personally reviewed the radiological images as listed and agreed with the findings in the report.  CMP Latest Ref Rng & Units 05/07/2018  Glucose 70 - 99 mg/dL 149(H)  BUN 6 - 20 mg/dL 15  Creatinine 0.61 - 1.24 mg/dL 0.96  Sodium 135 - 145 mmol/L 138  Potassium 3.5 - 5.1 mmol/L 3.5  Chloride 98 - 111 mmol/L 103  CO2  22 - 32 mmol/L 25  Calcium 8.9 - 10.3 mg/dL 9.5  Total Protein 6.5 - 8.1 g/dL 8.3(H)  Total Bilirubin 0.3 - 1.2 mg/dL 0.6  Alkaline Phos 38 - 126 U/L 51  AST 15 - 41 U/L 28  ALT 0 - 44 U/L 26   CBC Latest Ref Rng & Units 05/07/2018  WBC 4.0 - 10.5 K/uL 1.5(L)  Hemoglobin 13.0 - 17.0 g/dL 7.3(L)  Hematocrit 39.0 - 52.0 % 21.9(L)  Platelets 150 - 400 K/uL 67(L)    LABORATORY DATA:  I have reviewed the data as listed Lab Results  Component Value Date   WBC 1.5 (L) 05/07/2018   HGB 7.3 (L) 05/07/2018   HCT 21.9 (L) 05/07/2018   MCV 104.3 (H) 05/07/2018   PLT 67 (L) 05/07/2018   Recent Labs    08/12/17 1908 05/07/18 1556  NA 133* 138  K 2.9* 3.5  CL 94* 103  CO2 26 25  GLUCOSE 154* 149*  BUN 14 15  CREATININE 1.15 0.96  CALCIUM 9.4 9.5  GFRNONAA >60 >60  GFRAA >60 >60  PROT 8.9* 8.3*  ALBUMIN 3.7 4.5  AST 21 28  ALT 22 26  ALKPHOS 66 51  BILITOT 1.3* 0.6   Iron/TIBC/Ferritin/ %Sat    Component Value Date/Time   IRON 158 05/07/2018 1558   TIBC 403 05/07/2018 1558   FERRITIN 634 (H) 05/07/2018 1558   IRONPCTSAT 39 05/07/2018 1558        ASSESSMENT & PLAN:  1. Pancytopenia ([place])   2. Neutropenia, unspecified type ([place])   3. Anemia, unspecified type   4. Elevated serum protein level    Discussed with patient.  Concerned about the new onset of anemia, neutropenia and thrombocytopenia. However repeat CBC, differential, smear, folate, vitamin B12, reticulocyte panel, CMP, hepatitis panel, HIV antibody,  hold tubes, flow cytometry, LDH, haptoglobin, ferritin, iron, TIBC  Labs reviewed.  Symptomatic anemia, recommend proceed with 1 unit of irradiated PRBC transfusion. Neutropenia with ANC 0.1.  Recommend patient to start Cipro 500 mg daily for prophylaxis. Thrombocytopenia with platelet count of 67,000, no active bleeding.  Continue monitor. Normal LDH, haptoglobin, folate, B12, iron panel.  Suspect malignant pathology disorders. I discussed with patient about his results.  Recommend obtaining bone marrow biopsy for further evaluation. He voices understanding and agree with the plan.  We will also obtain multiple myeloma panel.  Orders Placed This Encounter  Procedures  . CBC with Differential/Platelet    Standing Status:   Future    Number of Occurrences:   1    Standing Expiration Date:   05/08/2019  . Comprehensive metabolic panel    Standing Status:   Future    Number of Occurrences:   1    Standing Expiration Date:   05/08/2019  . Retic Panel    Standing Status:   Future    Number of Occurrences:   1    Standing Expiration Date:   05/08/2019  . Vitamin B12    Standing Status:   Future    Number of Occurrences:   1    Standing Expiration Date:   05/08/2019  . Folate    Standing Status:   Future    Number of Occurrences:   1    Standing Expiration Date:   05/08/2019  . Flow cytometry panel-leukemia/lymphoma work-up    Standing Status:   Future    Number of Occurrences:   1    Standing Expiration Date:   05/08/2019  .  Lactate dehydrogenase    Standing Status:   Future    Number of Occurrences:   1    Standing Expiration Date:   05/08/2019  . Haptoglobin    Standing Status:   Future    Number of Occurrences:   1    Standing Expiration Date:   05/08/2019  . Hepatitis panel, acute    Standing Status:   Future    Number of Occurrences:   1    Standing Expiration Date:   05/08/2019  . HIV Antibody (routine testing w rflx)    Standing Status:   Future    Number of Occurrences:    1    Standing Expiration Date:   05/08/2019  . Iron and TIBC    Standing Status:   Future    Number of Occurrences:   1    Standing Expiration Date:   05/08/2019  . Ferritin    Standing Status:   Future    Number of Occurrences:   1    Standing Expiration Date:   05/08/2019  . Sample to Blood Bank    Standing Status:   Future    Number of Occurrences:   1    Standing Expiration Date:   05/07/2019    All questions were answered. The patient knows to call the clinic with any problems questions or concerns.  Return of visit: to be determined.  Thank you for this kind referral and the opportunity to participate in the care of this patient. A copy of today's note is routed to referring provider  Total face to face encounter time for this patient visit was  36mn. >50% of the time was  spent in counseling and coordination of care.    ZEarlie Server MD, PhD Hematology Oncology [facility] Centerat [facility] Ambulatory Surgery CenterPager- [phone]/19/2020

## 2018-05-08 ENCOUNTER — Other Ambulatory Visit: Payer: Self-pay | Admitting: *Deleted

## 2018-05-08 ENCOUNTER — Inpatient Hospital Stay: Payer: BLUE CROSS/BLUE SHIELD | Admitting: *Deleted

## 2018-05-08 ENCOUNTER — Inpatient Hospital Stay: Payer: BLUE CROSS/BLUE SHIELD

## 2018-05-08 ENCOUNTER — Ambulatory Visit: Payer: BLUE CROSS/BLUE SHIELD

## 2018-05-08 DIAGNOSIS — D61818 Other pancytopenia: Secondary | ICD-10-CM

## 2018-05-08 DIAGNOSIS — D649 Anemia, unspecified: Secondary | ICD-10-CM

## 2018-05-08 LAB — HAPTOGLOBIN: HAPTOGLOBIN: 119 mg/dL (ref 23–355)

## 2018-05-08 LAB — PREPARE RBC (CROSSMATCH)

## 2018-05-08 LAB — HEPATITIS PANEL, ACUTE
HEP B S AG: NEGATIVE
Hep A IgM: NEGATIVE
Hep B C IgM: NEGATIVE

## 2018-05-08 LAB — HIV ANTIBODY (ROUTINE TESTING W REFLEX): HIV Screen 4th Generation wRfx: NONREACTIVE

## 2018-05-08 LAB — ABO/RH: ABO/RH(D): O POS

## 2018-05-08 MED ORDER — ACETAMINOPHEN 325 MG PO TABS
650.0000 mg | ORAL_TABLET | Freq: Once | ORAL | Status: AC
  Administered 2018-05-08: 650 mg via ORAL
  Filled 2018-05-08: qty 2

## 2018-05-08 MED ORDER — DIPHENHYDRAMINE HCL 25 MG PO CAPS
25.0000 mg | ORAL_CAPSULE | Freq: Once | ORAL | Status: AC
  Administered 2018-05-08: 25 mg via ORAL
  Filled 2018-05-08: qty 1

## 2018-05-08 MED ORDER — SODIUM CHLORIDE 0.9% IV SOLUTION
250.0000 mL | Freq: Once | INTRAVENOUS | Status: AC
  Administered 2018-05-08: 250 mL via INTRAVENOUS
  Filled 2018-05-08: qty 250

## 2018-05-09 ENCOUNTER — Other Ambulatory Visit: Payer: Self-pay | Admitting: Student

## 2018-05-09 LAB — COMP PANEL: LEUKEMIA/LYMPHOMA

## 2018-05-09 LAB — TYPE AND SCREEN
ABO/RH(D): O POS
Antibody Screen: NEGATIVE
Unit division: 0

## 2018-05-09 LAB — BPAM RBC
Blood Product Expiration Date: 202003062359
ISSUE DATE / TIME: 202002201325
Unit Type and Rh: 5100

## 2018-05-12 ENCOUNTER — Other Ambulatory Visit (HOSPITAL_COMMUNITY)
Admission: RE | Admit: 2018-05-12 | Disposition: A | Discharge status: Home / Self Care | Payer: BLUE CROSS/BLUE SHIELD | Source: Ambulatory Visit | Attending: Oncology | Admitting: Oncology

## 2018-05-12 ENCOUNTER — Ambulatory Visit
Admission: RE | Admit: 2018-05-12 | Discharge: 2018-05-12 | Disposition: A | Discharge status: Home / Self Care | Payer: BLUE CROSS/BLUE SHIELD | Source: Ambulatory Visit | Attending: Oncology | Admitting: Oncology

## 2018-05-12 ENCOUNTER — Other Ambulatory Visit: Payer: Self-pay

## 2018-05-12 DIAGNOSIS — F17210 Nicotine dependence, cigarettes, uncomplicated: Secondary | ICD-10-CM | POA: Diagnosis not present

## 2018-05-12 DIAGNOSIS — I10 Essential (primary) hypertension: Secondary | ICD-10-CM | POA: Diagnosis not present

## 2018-05-12 DIAGNOSIS — D61818 Other pancytopenia: Secondary | ICD-10-CM | POA: Diagnosis present

## 2018-05-12 DIAGNOSIS — R5383 Other fatigue: Secondary | ICD-10-CM | POA: Insufficient documentation

## 2018-05-12 DIAGNOSIS — Z79899 Other long term (current) drug therapy: Secondary | ICD-10-CM | POA: Diagnosis not present

## 2018-05-12 DIAGNOSIS — R779 Abnormality of plasma protein, unspecified: Secondary | ICD-10-CM | POA: Insufficient documentation

## 2018-05-12 DIAGNOSIS — D649 Anemia, unspecified: Secondary | ICD-10-CM | POA: Insufficient documentation

## 2018-05-12 DIAGNOSIS — E119 Type 2 diabetes mellitus without complications: Secondary | ICD-10-CM | POA: Insufficient documentation

## 2018-05-12 DIAGNOSIS — Z7901 Long term (current) use of anticoagulants: Secondary | ICD-10-CM | POA: Insufficient documentation

## 2018-05-12 DIAGNOSIS — D709 Neutropenia, unspecified: Secondary | ICD-10-CM

## 2018-05-12 LAB — CBC WITH DIFFERENTIAL/PLATELET
Abs Immature Granulocytes: 0 10*3/uL (ref 0.00–0.07)
Basophils Absolute: 0 10*3/uL (ref 0.0–0.1)
Basophils Relative: 0 %
Eosinophils Absolute: 0 10*3/uL (ref 0.0–0.5)
Eosinophils Relative: 0 %
HCT: 24.1 % — ABNORMAL LOW (ref 39.0–52.0)
Hemoglobin: 7.7 g/dL — ABNORMAL LOW (ref 13.0–17.0)
Lymphocytes Relative: 91 %
Lymphs Abs: 1.3 10*3/uL (ref 0.7–4.0)
MCH: 32.6 pg (ref 26.0–34.0)
MCHC: 32 g/dL (ref 30.0–36.0)
MCV: 102.1 fL — ABNORMAL HIGH (ref 80.0–100.0)
Monocytes Absolute: 0 10*3/uL — ABNORMAL LOW (ref 0.1–1.0)
Monocytes Relative: 1 %
NEUTROS ABS: 0.1 10*3/uL — AB (ref 1.7–7.7)
Neutrophils Relative %: 8 %
PLATELETS: 61 10*3/uL — AB (ref 150–400)
RBC: 2.36 MIL/uL — ABNORMAL LOW (ref 4.22–5.81)
RDW: 22.2 % — ABNORMAL HIGH (ref 11.5–15.5)
Smear Review: NORMAL
WBC: 1.4 10*3/uL — CL (ref 4.0–10.5)
nRBC: 0 % (ref 0.0–0.2)
nRBC: 1 /100 WBC — ABNORMAL HIGH

## 2018-05-12 LAB — GLUCOSE, CAPILLARY: Glucose-Capillary: 133 mg/dL — ABNORMAL HIGH (ref 70–99)

## 2018-05-12 LAB — PROTIME-INR
INR: 1.12
Prothrombin Time: 14.3 seconds (ref 11.4–15.2)

## 2018-05-12 LAB — APTT: aPTT: 30 seconds (ref 24–36)

## 2018-05-12 MED ORDER — MIDAZOLAM HCL 5 MG/5ML IJ SOLN
INTRAMUSCULAR | Status: AC | PRN
  Administered 2018-05-12 (×2): 1 mg via INTRAVENOUS
  Administered 2018-05-12: 0.5 mg via INTRAVENOUS
  Administered 2018-05-12: 1 mg via INTRAVENOUS
  Administered 2018-05-12: 0.5 mg via INTRAVENOUS

## 2018-05-12 MED ORDER — MIDAZOLAM HCL 5 MG/5ML IJ SOLN
INTRAMUSCULAR | Status: AC
  Filled 2018-05-12: qty 5

## 2018-05-12 MED ORDER — HYDROCODONE-ACETAMINOPHEN 5-325 MG PO TABS
1.0000 | ORAL_TABLET | ORAL | Status: DC | PRN
  Filled 2018-05-12: qty 2

## 2018-05-12 MED ORDER — FENTANYL CITRATE (PF) 100 MCG/2ML IJ SOLN
INTRAMUSCULAR | Status: AC
  Filled 2018-05-12: qty 4

## 2018-05-12 MED ORDER — HEPARIN SOD (PORK) LOCK FLUSH 100 UNIT/ML IV SOLN
INTRAVENOUS | Status: AC
  Filled 2018-05-12: qty 5

## 2018-05-12 MED ORDER — SODIUM CHLORIDE 0.9 % IV SOLN
INTRAVENOUS | Status: DC
  Administered 2018-05-12: 08:00:00 via INTRAVENOUS

## 2018-05-12 MED ORDER — FENTANYL CITRATE (PF) 100 MCG/2ML IJ SOLN
INTRAMUSCULAR | Status: AC | PRN
  Administered 2018-05-12: 25 ug via INTRAVENOUS
  Administered 2018-05-12: 50 ug via INTRAVENOUS
  Administered 2018-05-12 (×2): 25 ug via INTRAVENOUS

## 2018-05-12 NOTE — H&P (Signed)
Chief Complaint: Pancytopenia  Referring Physician(s): Yu,Zhou  Supervising Physician: Arne Cleveland  Patient Status: [facility] - Out-pt  History of Present Illness: Roy Valentine is a 48 y.o. male who had lab work up done by his PCP whch showed pancytopenia.  He was referred to Dr. Tasia Catchings for evaluation.  Recent CBC showed hemoglobin of 7, WBC 1.5, platelet count 67.  He c/o reports worsening fatigue which lead to loss of his job.   He now works as an Mining engineer.   No nausea/vomiting. No Fever/chills. Otherwise ROS negative.  We are asked to perform a bone marrow biopsy.  He is NPO.   Past Medical History:  Diagnosis Date  . Diabetes mellitus without complication ([place])   . Hypertension     Past Surgical History:  Procedure Laterality Date  . COLON SURGERY      Allergies: Patient has no known allergies.  Medications: Prior to Admission medications   Medication Sig Start Date End Date Taking? Authorizing Provider  buPROPion (WELLBUTRIN XL) 150 MG 24 hr tablet Take 150 mg by mouth daily.   Yes [provider]  ciprofloxacin (CIPRO) 500 MG tablet Take 1 tablet (500 mg total) by mouth daily. 05/07/18  Yes [name], MD  lisinopril (PRINIVIL,ZESTRIL) 5 MG tablet Take 5 mg by mouth daily. 04/29/18  Yes [provider]  JARDIANCE 10 MG TABS tablet Take 10 mg by mouth daily. 05/05/18   [provider]  Vitamin D, Ergocalciferol, (DRISDOL) 1.25 MG (50000 UT) CAPS capsule Take 50,000 Units by mouth once a week. 05/01/18   [provider]     Family History  Problem Relation Age of Onset  . Thyroid disease Mother   . Alzheimer's disease Mother     Social History   Socioeconomic History  . Marital status: Single    Spouse name: Not on file  . Number of children: Not on file  . Years of education: Not on file  . Highest education level: Not on file  Occupational History  . Not on file  Social Needs  . Financial resource  strain: Not on file  . Food insecurity:    Worry: Not on file    Inability: Not on file  . Transportation needs:    Medical: Not on file    Non-medical: Not on file  Tobacco Use  . Smoking status: Current Every Day Smoker    Packs/day: 1.00    Years: 30.00    Pack years: 30.00  . Smokeless tobacco: Never Used  Substance and Sexual Activity  . Alcohol use: Never    Frequency: Never  . Drug use: Yes    Types: Marijuana    Comment: smoked marijuana in college  . Sexual activity: Not on file  Lifestyle  . Physical activity:    Days per week: Not on file    Minutes per session: Not on file  . Stress: Not on file  Relationships  . Social connections:    Talks on phone: Not on file    Gets together: Not on file    Attends religious service: Not on file    Active member of club or organization: Not on file    Attends meetings of clubs or organizations: Not on file    Relationship status: Not on file  Other Topics Concern  . Not on file  Social History Narrative  . Not on file     Review of Systems: A 12 point ROS discussed  and pertinent positives are indicated in the HPI above.  All other systems are negative.  Review of Systems  Vital Signs: BP (!) 141/84   Pulse 98   Temp 98.9 F (37.2 C) (Oral)   Resp 17   Ht 5' 9"  (1.753 m)   Wt 84.1 kg   BMI 27.39 kg/m   Physical Exam Vitals signs reviewed.  Constitutional:      Appearance: Normal appearance.  HENT:     Head: Normocephalic and atraumatic.  Eyes:     Extraocular Movements: Extraocular movements intact.  Neck:     Musculoskeletal: Normal range of motion.  Cardiovascular:     Rate and Rhythm: Normal rate and regular rhythm.     Heart sounds: Normal heart sounds.  Pulmonary:     Effort: Pulmonary effort is normal. No respiratory distress.     Breath sounds: Normal breath sounds.  Abdominal:     General: There is no distension.     Palpations: Abdomen is soft.     Tenderness: There is no abdominal  tenderness.  Musculoskeletal: Normal range of motion.  Skin:    General: Skin is warm and dry.  Neurological:     General: No focal deficit present.     Mental Status: He is alert and oriented to person, place, and time.  Psychiatric:        Mood and Affect: Mood normal.        Behavior: Behavior normal.        Thought Content: Thought content normal.        Judgment: Judgment normal.     Imaging: No results found.  Labs:  CBC: Recent Labs    08/12/17 1908 05/07/18 1556  WBC 1.7* 1.5*  HGB 16.0 7.3*  HCT 46.7 21.9*  PLT 247 67*    COAGS: Recent Labs    05/12/18 0733  INR 1.12  APTT 30    BMP: Recent Labs    08/12/17 1908 05/07/18 1556  NA 133* 138  K 2.9* 3.5  CL 94* 103  CO2 26 25  GLUCOSE 154* 149*  BUN 14 15  CALCIUM 9.4 9.5  CREATININE 1.15 0.96  GFRNONAA >60 >60  GFRAA >60 >60    LIVER FUNCTION TESTS: Recent Labs    08/12/17 1908 05/07/18 1556  BILITOT 1.3* 0.6  AST 21 28  ALT 22 26  ALKPHOS 66 51  PROT 8.9* 8.3*  ALBUMIN 3.7 4.5    TUMOR MARKERS: No results for input(s): AFPTM, CEA, CA199, CHROMGRNA in the last 8760 hours.  Assessment and Plan:  Pancytopenia  Will proceed with image guided bone marrow biopsy today by Dr. Vernard Gambles.  Risks and benefits of bone marrow biopsy was discussed with the patient and/or patient's family including, but not limited to bleeding, infection, damage to adjacent structures or low yield requiring additional tests.  All of the questions were answered and there is agreement to proceed.  Consent signed and in chart.  Thank you for this interesting consult.  I greatly enjoyed meeting Roy Valentine and look forward to participating in their care.  A copy of this report was sent to the requesting provider on this date.  Electronically Signed: Murrell Redden, PA-C   05/12/2018, 8:10 AM      I spent a total of  30 Minutes in face to face in clinical consultation, greater than 50% of which  was counseling/coordinating care for bone marrow biopsy.

## 2018-05-12 NOTE — Procedures (Signed)
  Procedure: CT bone marrow biopsy R iliac EBL:   minimal Complications:  none immediate  See full dictation in [facility] PACS.  D. Erykah Lippert MD Main # [phone] Pager  [phone]    

## 2018-05-13 ENCOUNTER — Other Ambulatory Visit: Payer: Self-pay | Admitting: Oncology

## 2018-05-13 DIAGNOSIS — D649 Anemia, unspecified: Secondary | ICD-10-CM

## 2018-05-13 LAB — KAPPA/LAMBDA LIGHT CHAINS
Kappa free light chain: 34.4 mg/L — ABNORMAL HIGH (ref 3.3–19.4)
Kappa, lambda light chain ratio: 1.55 (ref 0.26–1.65)
Lambda free light chains: 22.2 mg/L (ref 5.7–26.3)

## 2018-05-14 ENCOUNTER — Inpatient Hospital Stay: Payer: BLUE CROSS/BLUE SHIELD | Admitting: Oncology

## 2018-05-14 LAB — MULTIPLE MYELOMA PANEL, SERUM
Albumin SerPl Elph-Mcnc: 3.9 g/dL (ref 2.9–4.4)
Albumin/Glob SerPl: 1.3 (ref 0.7–1.7)
Alpha 1: 0.3 g/dL (ref 0.0–0.4)
Alpha2 Glob SerPl Elph-Mcnc: 0.6 g/dL (ref 0.4–1.0)
B-Globulin SerPl Elph-Mcnc: 1.1 g/dL (ref 0.7–1.3)
GAMMA GLOB SERPL ELPH-MCNC: 1.1 g/dL (ref 0.4–1.8)
GLOBULIN, TOTAL: 3.1 g/dL (ref 2.2–3.9)
IgA: 294 mg/dL (ref 90–386)
IgG (Immunoglobin G), Serum: 1210 mg/dL (ref 700–1600)
IgM (Immunoglobulin M), Srm: 32 mg/dL (ref 20–172)
Total Protein ELP: 7 g/dL (ref 6.0–8.5)

## 2018-05-15 ENCOUNTER — Other Ambulatory Visit: Payer: Self-pay | Admitting: Oncology

## 2018-05-15 ENCOUNTER — Other Ambulatory Visit (HOSPITAL_COMMUNITY)
Admission: RE | Admit: 2018-05-15 | Discharge: 2018-05-15 | Disposition: A | Discharge status: Home / Self Care | Payer: BLUE CROSS/BLUE SHIELD | Source: Ambulatory Visit | Attending: Oncology | Admitting: Oncology

## 2018-05-15 DIAGNOSIS — D61818 Other pancytopenia: Secondary | ICD-10-CM | POA: Diagnosis not present

## 2018-05-15 DIAGNOSIS — D649 Anemia, unspecified: Secondary | ICD-10-CM

## 2018-05-20 ENCOUNTER — Inpatient Hospital Stay: Payer: BLUE CROSS/BLUE SHIELD

## 2018-05-20 ENCOUNTER — Inpatient Hospital Stay: Payer: BLUE CROSS/BLUE SHIELD | Attending: Oncology | Admitting: Oncology

## 2018-05-20 ENCOUNTER — Other Ambulatory Visit: Payer: Self-pay

## 2018-05-20 ENCOUNTER — Encounter: Payer: Self-pay | Admitting: Oncology

## 2018-05-20 VITALS — BP 128/69 | HR 107 | Temp 97.9°F | Ht 69.0 in | Wt 187.0 lb

## 2018-05-20 DIAGNOSIS — R5383 Other fatigue: Secondary | ICD-10-CM | POA: Diagnosis not present

## 2018-05-20 DIAGNOSIS — E119 Type 2 diabetes mellitus without complications: Secondary | ICD-10-CM | POA: Diagnosis not present

## 2018-05-20 DIAGNOSIS — I10 Essential (primary) hypertension: Secondary | ICD-10-CM | POA: Diagnosis not present

## 2018-05-20 DIAGNOSIS — F17210 Nicotine dependence, cigarettes, uncomplicated: Secondary | ICD-10-CM | POA: Diagnosis not present

## 2018-05-20 DIAGNOSIS — D649 Anemia, unspecified: Secondary | ICD-10-CM

## 2018-05-20 DIAGNOSIS — Z5111 Encounter for antineoplastic chemotherapy: Secondary | ICD-10-CM | POA: Insufficient documentation

## 2018-05-20 DIAGNOSIS — D61818 Other pancytopenia: Secondary | ICD-10-CM | POA: Insufficient documentation

## 2018-05-20 DIAGNOSIS — C9140 Hairy cell leukemia not having achieved remission: Secondary | ICD-10-CM

## 2018-05-20 DIAGNOSIS — D709 Neutropenia, unspecified: Secondary | ICD-10-CM

## 2018-05-20 LAB — CBC WITH DIFFERENTIAL/PLATELET
Abs Immature Granulocytes: 0 10*3/uL (ref 0.00–0.07)
Basophils Absolute: 0 10*3/uL (ref 0.0–0.1)
Basophils Relative: 1 %
Eosinophils Absolute: 0 10*3/uL (ref 0.0–0.5)
Eosinophils Relative: 2 %
HCT: 23.7 % — ABNORMAL LOW (ref 39.0–52.0)
Hemoglobin: 7.7 g/dL — ABNORMAL LOW (ref 13.0–17.0)
IMMATURE GRANULOCYTES: 0 %
Lymphocytes Relative: 86 %
Lymphs Abs: 1.1 10*3/uL (ref 0.7–4.0)
MCH: 32.8 pg (ref 26.0–34.0)
MCHC: 32.5 g/dL (ref 30.0–36.0)
MCV: 100.9 fL — ABNORMAL HIGH (ref 80.0–100.0)
Monocytes Absolute: 0 10*3/uL — ABNORMAL LOW (ref 0.1–1.0)
Monocytes Relative: 2 %
Neutro Abs: 0.1 10*3/uL — ABNORMAL LOW (ref 1.7–7.7)
Neutrophils Relative %: 9 %
Platelets: 64 10*3/uL — ABNORMAL LOW (ref 150–400)
RBC: 2.35 MIL/uL — ABNORMAL LOW (ref 4.22–5.81)
RDW: 21.3 % — ABNORMAL HIGH (ref 11.5–15.5)
WBC: 1.3 10*3/uL — AB (ref 4.0–10.5)
nRBC: 0 % (ref 0.0–0.2)

## 2018-05-20 LAB — SAMPLE TO BLOOD BANK

## 2018-05-20 LAB — URIC ACID: Uric Acid, Serum: 4.3 mg/dL (ref 3.7–8.6)

## 2018-05-20 MED ORDER — NYSTATIN 100000 UNIT/ML MT SUSP: 5.0000 mL | Freq: Four times a day (QID) | OROMUCOSAL | 0 refills | Status: DC

## 2018-05-20 NOTE — Progress Notes (Signed)
Patient here today for follow up.  Patient c/o SOB and nausea

## 2018-05-20 NOTE — Progress Notes (Addendum)
Hematology/Oncology Consult note [facility] [phone] [phone]   Patient Care Team: Perrin Maltese, MD as PCP - General (Internal Medicine)  REFERRING PROVIDER: Dr.Khan. CHIEF COMPLAINTS/REASON FOR VISIT:  Evaluation of pancytopenia.   HISTORY OF PRESENTING ILLNESS:  Roy Valentine is a  48 y.o.  male with PMH listed below who was referred to me for evaluation of pancytopenia.  Patient recently follows up with pcp and had lab work up done.  Cbc showed hemoglobin of 7, leukopenia with ANC 0.1, thrombocytopenia with platelet count   Fatigue: reports worsening fatigue. Chronic onset, perisistent, for the past few months. no aggravating or improving factors, no associated symptoms.  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   He has ED visit on 08/12/2017 due to cough and fever. CXR showed pneumonia.  Cbc on 08/12/2017 showed wbc 1.7, hemoglobin 16, platelet 247,000.  No differential was checked at that time. Patient was given a course of antibiotics and per patient his symptoms improved.   Denies hematochezia, hematuria, hematemesis, epistaxis, black tarry stool or easy bruising.  Denies fever or chills, any recent frequent infections.  He has lost about 10 pounds since May 2019. Appetite is fair.  INTERVAL HISTORY Roy Valentine is a 48 y.o. male who has above history reviewed by me today presents for follow up visit for management of pancytopenia. During the interval, I have arranged patient to have bone marrow biopsy done and patient present to discuss results. He also present for lab check. Reports feeling better for a few days after blood transfusion.  Still feels tired and chronically shortness of breath. 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  Denies any fever, chills. Appetite is fair.  Gained 2 pounds since last visit.  Review of Systems  Constitutional: Positive for fatigue and unexpected weight change.  Negative for appetite change, chills and fever.  HENT:   Negative for hearing loss and voice change.   Eyes: Negative for eye problems and icterus.  Respiratory: Negative for chest tightness, cough and shortness of breath.   Cardiovascular: Negative for chest pain and leg swelling.  Gastrointestinal: Negative for abdominal distention and abdominal pain.  Endocrine: Negative for hot flashes.  Genitourinary: Negative for difficulty urinating, dysuria and frequency.   Musculoskeletal: Negative for arthralgias.  Skin: Negative for itching and rash.  Neurological: Negative for light-headedness and numbness.  Hematological: Negative for adenopathy. Does not bruise/bleed easily.  Psychiatric/Behavioral: Negative for confusion.    MEDICAL HISTORY:  Past Medical History:  Diagnosis Date  . Diabetes mellitus without complication ([place])   . Hypertension     SURGICAL HISTORY: Past Surgical History:  Procedure Laterality Date  . COLON SURGERY      SOCIAL HISTORY: Social History   Socioeconomic History  . Marital status: Single    Spouse name: Not on file  . Number of children: Not on file  . Years of education: Not on file  . Highest education level: Not on file  Occupational History  . Not on file  Social Needs  . Financial resource strain: Not on file  . Food insecurity:    Worry: Not on file    Inability: Not on file  . Transportation needs:    Medical: Not on file    Non-medical: Not on file  Tobacco Use  . Smoking status: Current Every Day Smoker    Packs/day: 1.00    Years: 30.00    Pack years: 30.00  . Smokeless  tobacco: Never Used  Substance and Sexual Activity  . Alcohol use: Never    Frequency: Never  . Drug use: Yes    Types: Marijuana    Comment: smoked marijuana in college  . Sexual activity: Not on file  Lifestyle  . Physical activity:    Days per week: Not on file    Minutes per session: Not on file  . Stress: Not on file  Relationships  . Social  connections:    Talks on phone: Not on file    Gets together: Not on file    Attends religious service: Not on file    Active member of club or organization: Not on file    Attends meetings of clubs or organizations: Not on file    Relationship status: Not on file  . Intimate partner violence:    Fear of current or ex partner: Not on file    Emotionally abused: Not on file    Physically abused: Not on file    Forced sexual activity: Not on file  Other Topics Concern  . Not on file  Social History Narrative  . Not on file    FAMILY HISTORY: Family History  Problem Relation Age of Onset  . Thyroid disease Mother   . Alzheimer's disease Mother     ALLERGIES:  has No Known Allergies.  MEDICATIONS:  Current Outpatient Medications  Medication Sig Dispense Refill  . buPROPion (WELLBUTRIN XL) 150 MG 24 hr tablet Take 150 mg by mouth daily.    . ciprofloxacin (CIPRO) 500 MG tablet Take 1 tablet (500 mg total) by mouth daily. 30 tablet 0  . lisinopril (PRINIVIL,ZESTRIL) 5 MG tablet Take 5 mg by mouth daily.    . Vitamin D, Ergocalciferol, (DRISDOL) 1.25 MG (50000 UT) CAPS capsule Take 50,000 Units by mouth once a week.    . nystatin (MYCOSTATIN) 100000 UNIT/ML suspension Take 5 mLs (500,000 Units total) by mouth 4 (four) times daily. 473 mL 0   No current facility-administered medications for this visit.      PHYSICAL EXAMINATION: ECOG PERFORMANCE STATUS: 1 - Symptomatic but completely ambulatory Vitals:   05/20/18 0855  BP: 128/69  Pulse: (!) 107  Temp: 97.9 F (36.6 C)  SpO2: 99%   Filed Weights   05/20/18 0855  Weight: 187 lb (84.8 kg)    Physical Exam Constitutional:      General: He is not in acute distress. HENT:     Head: Normocephalic and atraumatic.  Eyes:     General: No scleral icterus.    Pupils: Pupils are equal, round, and reactive to light.  Neck:     Musculoskeletal: Normal range of motion and neck supple.  Cardiovascular:     Rate and Rhythm:  Normal rate and regular rhythm.     Heart sounds: Normal heart sounds.  Pulmonary:     Effort: Pulmonary effort is normal. No respiratory distress.     Breath sounds: No wheezing.  Abdominal:     General: Bowel sounds are normal. There is no distension.     Palpations: Abdomen is soft. There is no mass.     Tenderness: There is no abdominal tenderness.  Musculoskeletal: Normal range of motion.        General: No deformity.  Skin:    General: Skin is warm and dry.     Coloration: Skin is pale.     Findings: No erythema or rash.  Neurological:     Mental Status: He is alert  and oriented to person, place, and time.     Cranial Nerves: No cranial nerve deficit.     Coordination: Coordination normal.  Psychiatric:        Behavior: Behavior normal.        Thought Content: Thought content normal.     RADIOGRAPHIC STUDIES: I have personally reviewed the radiological images as listed and agreed with the findings in the report.  CMP Latest Ref Rng & Units 05/07/2018  Glucose 70 - 99 mg/dL 149(H)  BUN 6 - 20 mg/dL 15  Creatinine 0.61 - 1.24 mg/dL 0.96  Sodium 135 - 145 mmol/L 138  Potassium 3.5 - 5.1 mmol/L 3.5  Chloride 98 - 111 mmol/L 103  CO2 22 - 32 mmol/L 25  Calcium 8.9 - 10.3 mg/dL 9.5  Total Protein 6.5 - 8.1 g/dL 8.3(H)  Total Bilirubin 0.3 - 1.2 mg/dL 0.6  Alkaline Phos 38 - 126 U/L 51  AST 15 - 41 U/L 28  ALT 0 - 44 U/L 26   CBC Latest Ref Rng & Units 05/20/2018  WBC 4.0 - 10.5 K/uL 1.3(LL)  Hemoglobin 13.0 - 17.0 g/dL 7.7(L)  Hematocrit 39.0 - 52.0 % 23.7(L)  Platelets 150 - 400 K/uL 64(L)    LABORATORY DATA:  I have reviewed the data as listed Lab Results  Component Value Date   WBC 1.3 (LL) 05/20/2018   HGB 7.7 (L) 05/20/2018   HCT 23.7 (L) 05/20/2018   MCV 100.9 (H) 05/20/2018   PLT 64 (L) 05/20/2018   Recent Labs    08/12/17 1908 05/07/18 1556  NA 133* 138  K 2.9* 3.5  CL 94* 103  CO2 26 25  GLUCOSE 154* 149*  BUN 14 15  CREATININE 1.15 0.96    CALCIUM 9.4 9.5  GFRNONAA >60 >60  GFRAA >60 >60  PROT 8.9* 8.3*  ALBUMIN 3.7 4.5  AST 21 28  ALT 22 26  ALKPHOS 66 51  BILITOT 1.3* 0.6   Iron/TIBC/Ferritin/ %Sat    Component Value Date/Time   IRON 158 05/07/2018 1558   TIBC 403 05/07/2018 1558   FERRITIN 634 (H) 05/07/2018 1558   IRONPCTSAT 39 05/07/2018 1558     05/07/2018 peripheral blood flow cytometry showed CD103+, CD11c+, CD 25+, clonal B-cell population detected, representing 3% of the leukocytes, phenotype consistent with hairy cell leukemia.  05/12/2018 bone marrow biopsy Diagnosis Bone Marrow, Aspirate,Biopsy, and Clot, right iliac - HYPERCELLULAR BONE MARROW FOR AGE WITH INVOLVEMENT BY A B-CELL LYMPHOPROLIFERATIVE DISORDER. - SEE COMMENT. PERIPHERAL BLOOD: - PANCYTOPENIA. Diagnosis Note The overall material is suboptimal with lack of aspirate. Limited touch imprints and a core biopsy show extensive involvement by a B-cell lymphoproliferative process of primarily small to medium sized lymphocytes with a predominant interstitial pattern of involvement. Flow cytometric analysis was attempted but much of the material likely represents a peripheralized sample and is not considered entirely contributory. Immunohistochemical stains show that B-cells weakly express cyclin D1 but lack CD5, CD10, CD34, CD138, or TdT expression. 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 A repeat biopsy with fresh core biopsy material for flow cytometric study is strongly recommended for definite and accurate sub-classification of this process. (BNS:ecj 05/14/2018)   ASSESSMENT & PLAN:  1. Pancytopenia ([place])   2. Neutropenia, unspecified type ([place])   3. Anemia, unspecified type    Lab work-up and bone marrow biopsy results were discussed in details with patient. 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 He had a dry tap  for bone marrow biopsy. Discussed with pathology Dr. Gari Crown,  patient has hypercellular bone marrow with involvement by a B-cell lymphoproliferative disorder.   Flow cytometry analysis of the bone marrow was attempted ut much of the material likely represents a peripheralized sample and is not considered entirely contributory. Immunohistochemical stains show that B-cells weakly express cyclin D1 but lack CD5, CD10, CD34, CD138, or TdT expression. 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  Dr. Gari Crown cannot conclude that patient has hairy cell leukemia with current bone marrow specimen.  He recommend a repeat bone marrow biopsy with fresh core biopsy material for flow cytometry to confirm patient's diagnosis.  I discussed with patient about the clinical diagnosis of hairy cell leukemia which still need to be further confirmed.  Before we obtain a repeat sample, I recommend sent current bone marrow specimen to tertiary center for second opinion. If still cannot confirm hairy cell leukemia, will proceed with a second biopsy.  We will obtain BRAF analysis.  Physical examination did not reveal massive splenomegaly.  I will obtain ultrasound for further evaluation.  Discussed with patient about treatment for the presumed diagnosis of hairy cell leukemia.  Also not curative, treatment can alleviate symptoms reverse cytopenia and prolong survival to a near normal lifespan.  Most patient can achieve durable remission with long-term treatment free periods followed by further therapy when symptomatic relapse occurs. Recommend purine analogs cladribine- 0.14 mg/kg/day intravenously over two hours once per day for five days.  Patient will be referred to chemo class.  We will wait until the second opinion is back before starting treatment.  Neutropenia, recommend patient to continue take prophylactic antibiotics with Cipro.  Also will give her nystatin swish and spit oral rinse to be used.  Patient has been educated about increased infection risk and  life-threatening infection given her significantly reduced immunity.  He knows to call clinic or go to emergency room immediately if he spikes fever. . Orders Placed This Encounter  Procedures  . US SPLEEN (ABDOMEN LIMITED)    Standing Status:   Future    Standing Expiration Date:   07/20/2019    Order Specific Question:   Reason for Exam (SYMPTOM  OR DIAGNOSIS REQUIRED)    Answer:   suspect hairy cell leukemia.    Order Specific Question:   Preferred imaging location?    Answer:   [facility]    All questions were answered. The patient knows to call the clinic with any problems questions or concerns.  Return of visit: 2 weeks.  We spent sufficient time to discuss many aspect of care, questions were answered to patient's satisfaction. Total face to face encounter time for this patient visit was 25 min. >50% of the time was  spent in counseling and coordination of care.    [name], MD, PhD Hematology Oncology [facility] Pager- [phone] 05/20/2018

## 2018-05-22 ENCOUNTER — Encounter (HOSPITAL_COMMUNITY): Payer: Self-pay

## 2018-05-23 ENCOUNTER — Other Ambulatory Visit: Payer: Self-pay | Admitting: Oncology

## 2018-05-23 DIAGNOSIS — C9140 Hairy cell leukemia not having achieved remission: Secondary | ICD-10-CM

## 2018-05-26 ENCOUNTER — Ambulatory Visit
Admission: RE | Admit: 2018-05-26 | Discharge: 2018-05-26 | Disposition: A | Discharge status: Home / Self Care | Payer: BLUE CROSS/BLUE SHIELD | Source: Ambulatory Visit | Attending: Oncology | Admitting: Oncology

## 2018-05-26 DIAGNOSIS — C9140 Hairy cell leukemia not having achieved remission: Secondary | ICD-10-CM | POA: Diagnosis present

## 2018-05-26 DIAGNOSIS — D649 Anemia, unspecified: Secondary | ICD-10-CM | POA: Diagnosis present

## 2018-05-26 DIAGNOSIS — D61818 Other pancytopenia: Secondary | ICD-10-CM | POA: Diagnosis not present

## 2018-05-26 DIAGNOSIS — D709 Neutropenia, unspecified: Secondary | ICD-10-CM | POA: Insufficient documentation

## 2018-05-27 ENCOUNTER — Inpatient Hospital Stay: Payer: BLUE CROSS/BLUE SHIELD

## 2018-05-27 DIAGNOSIS — Z5111 Encounter for antineoplastic chemotherapy: Secondary | ICD-10-CM | POA: Diagnosis not present

## 2018-05-27 DIAGNOSIS — D649 Anemia, unspecified: Secondary | ICD-10-CM

## 2018-05-27 DIAGNOSIS — C9140 Hairy cell leukemia not having achieved remission: Secondary | ICD-10-CM

## 2018-05-27 LAB — CBC WITH DIFFERENTIAL/PLATELET
Abs Immature Granulocytes: 0 10*3/uL (ref 0.00–0.07)
BASOS ABS: 0 10*3/uL (ref 0.0–0.1)
Basophils Relative: 0 %
Eosinophils Absolute: 0 10*3/uL (ref 0.0–0.5)
Eosinophils Relative: 1 %
HCT: 21.9 % — ABNORMAL LOW (ref 39.0–52.0)
Hemoglobin: 7.3 g/dL — ABNORMAL LOW (ref 13.0–17.0)
IMMATURE GRANULOCYTES: 0 %
Lymphocytes Relative: 87 %
Lymphs Abs: 1.1 10*3/uL (ref 0.7–4.0)
MCH: 34 pg (ref 26.0–34.0)
MCHC: 33.3 g/dL (ref 30.0–36.0)
MCV: 101.9 fL — ABNORMAL HIGH (ref 80.0–100.0)
Monocytes Absolute: 0 10*3/uL — ABNORMAL LOW (ref 0.1–1.0)
Monocytes Relative: 2 %
Neutro Abs: 0.1 10*3/uL — ABNORMAL LOW (ref 1.7–7.7)
Neutrophils Relative %: 10 %
Platelets: 58 10*3/uL — ABNORMAL LOW (ref 150–400)
RBC: 2.15 MIL/uL — ABNORMAL LOW (ref 4.22–5.81)
RDW: 21.7 % — ABNORMAL HIGH (ref 11.5–15.5)
WBC: 1.3 10*3/uL — CL (ref 4.0–10.5)
nRBC: 0 % (ref 0.0–0.2)

## 2018-05-27 LAB — SAMPLE TO BLOOD BANK

## 2018-06-03 ENCOUNTER — Other Ambulatory Visit: Payer: Self-pay | Admitting: Oncology

## 2018-06-03 ENCOUNTER — Encounter: Payer: Self-pay | Admitting: Oncology

## 2018-06-03 ENCOUNTER — Inpatient Hospital Stay: Payer: BLUE CROSS/BLUE SHIELD

## 2018-06-03 ENCOUNTER — Inpatient Hospital Stay (HOSPITAL_BASED_OUTPATIENT_CLINIC_OR_DEPARTMENT_OTHER): Payer: BLUE CROSS/BLUE SHIELD | Admitting: Oncology

## 2018-06-03 ENCOUNTER — Other Ambulatory Visit: Payer: Self-pay

## 2018-06-03 VITALS — BP 142/81 | HR 111 | Temp 97.4°F | Resp 18 | Wt 186.3 lb

## 2018-06-03 DIAGNOSIS — Z5111 Encounter for antineoplastic chemotherapy: Secondary | ICD-10-CM | POA: Diagnosis not present

## 2018-06-03 DIAGNOSIS — C9140 Hairy cell leukemia not having achieved remission: Secondary | ICD-10-CM

## 2018-06-03 DIAGNOSIS — D61818 Other pancytopenia: Secondary | ICD-10-CM | POA: Diagnosis not present

## 2018-06-03 DIAGNOSIS — D649 Anemia, unspecified: Secondary | ICD-10-CM

## 2018-06-03 DIAGNOSIS — D709 Neutropenia, unspecified: Secondary | ICD-10-CM | POA: Insufficient documentation

## 2018-06-03 DIAGNOSIS — Z7189 Other specified counseling: Secondary | ICD-10-CM | POA: Insufficient documentation

## 2018-06-03 HISTORY — DX: Hairy cell leukemia not having achieved remission: C91.40

## 2018-06-03 LAB — CBC WITH DIFFERENTIAL/PLATELET
Abs Immature Granulocytes: 0 10*3/uL (ref 0.00–0.07)
Basophils Absolute: 0 10*3/uL (ref 0.0–0.1)
Basophils Relative: 0 %
Eosinophils Absolute: 0 10*3/uL (ref 0.0–0.5)
Eosinophils Relative: 1 %
HCT: 20 % — ABNORMAL LOW (ref 39.0–52.0)
Hemoglobin: 6.6 g/dL — ABNORMAL LOW (ref 13.0–17.0)
Immature Granulocytes: 0 %
Lymphocytes Relative: 87 %
Lymphs Abs: 1.1 10*3/uL (ref 0.7–4.0)
MCH: 33.3 pg (ref 26.0–34.0)
MCHC: 33 g/dL (ref 30.0–36.0)
MCV: 101 fL — AB (ref 80.0–100.0)
Monocytes Absolute: 0 10*3/uL — ABNORMAL LOW (ref 0.1–1.0)
Monocytes Relative: 2 %
Neutro Abs: 0.1 10*3/uL — ABNORMAL LOW (ref 1.7–7.7)
Neutrophils Relative %: 10 %
PLATELETS: 55 10*3/uL — AB (ref 150–400)
RBC: 1.98 MIL/uL — ABNORMAL LOW (ref 4.22–5.81)
RDW: 21.6 % — ABNORMAL HIGH (ref 11.5–15.5)
Smear Review: NORMAL
WBC: 1.3 10*3/uL — CL (ref 4.0–10.5)
nRBC: 0 % (ref 0.0–0.2)

## 2018-06-03 LAB — SAMPLE TO BLOOD BANK

## 2018-06-03 MED ORDER — ONDANSETRON HCL 8 MG PO TABS: 8.0000 mg | ORAL_TABLET | Freq: Two times a day (BID) | ORAL | 1 refills | Status: DC | PRN

## 2018-06-03 NOTE — Progress Notes (Signed)
ALERT: A disease instance has been permanently removed from this patient's pathway record and replaced with a new disease instance. Information on the new disease instance will be transmitted in a separate message.  Disease Being Removed: [Other Dx]  Reason for Removal: Reason not listed 

## 2018-06-03 NOTE — Patient Instructions (Signed)
Cladribine injection for infusion What is this medicine? CLADRIBINE (KLA dri been) is a chemotherapy drug. This medicine reduces the growth of cancer cells and can suppress the immune system. It is used for treating leukemias. This medicine may be used for other purposes; ask your health care provider or pharmacist if you have questions. COMMON BRAND NAME(S): Leustatin What should I tell my health care provider before I take this medicine? They need to know if you have any of these conditions: -bleeding problems -infection (especially a virus infection such as chickenpox, cold sores, or herpes) -kidney disease -liver disease -an unusual or allergic reaction to cladribine, benzyl alcohol, other medicines, foods, dyes, or preservatives -pregnant or trying to get pregnant -breast-feeding How should I use this medicine? This drug is given as an infusion into a vein. It is administered in a hospital or clinic by a specially trained health care professional. Talk to your pediatrician regarding the use of this medicine in children. While this drug may be prescribed for children for selected conditions, precautions do apply. Overdosage: If you think you have taken too much of this medicine contact a poison control center or emergency room at once. NOTE: This medicine is only for you. Do not share this medicine with others. What if I miss a dose? It is important not to miss a dose. Call your doctor or health care professional if you are unable to keep an appointment. What may interact with this medicine? -vaccines Talk to your doctor or health care professional before taking any of these medicines: -acetaminophen -aspirin -ibuprofen -naproxen -ketoprofen This list may not describe all possible interactions. Give your health care provider a list of all the medicines, herbs, non-prescription drugs, or dietary supplements you use. Also tell them if you smoke, drink alcohol, or use illegal drugs.  Some items may interact with your medicine. What should I watch for while using this medicine? This drug may make you feel generally unwell. This is not uncommon, as chemotherapy can affect healthy cells as well as cancer cells. Report any side effects. Continue your course of treatment even though you feel ill unless your doctor tells you to stop. In some cases, you may be given additional medicines to help with side effects. Follow all directions for their use. Call your doctor or health care professional for advice if you get a fever, chills or sore throat, or other symptoms of a cold or flu. Do not treat yourself. This drug decreases your body's ability to fight infections. Try to avoid being around people who are sick. This medicine may increase your risk to bruise or bleed. Call your doctor or health care professional if you notice any unusual bleeding. Be careful brushing and flossing your teeth or using a toothpick because you may get an infection or bleed more easily. If you have any dental work done, tell your dentist you are receiving this medicine. Avoid taking products that contain aspirin, acetaminophen, ibuprofen, naproxen, or ketoprofen unless instructed by your doctor. These medicines may hide a fever. Do not become pregnant while taking this medicine. Women should inform their doctor if they wish to become pregnant or think they might be pregnant. There is a potential for serious side effects to an unborn child. Talk to your health care professional or pharmacist for more information. Do not breast-feed an infant while taking this medicine. If you are a man, you should not father a child while receiving treatment. What side effects may I notice from receiving this   medicine? Side effects that you should report to your doctor or health care professional as soon as possible: -allergic reactions like skin rash, itching or hives, swelling of the face, lips, or tongue -low blood counts -  This drug may decrease the number of white blood cells, red blood cells and platelets. You may be at increased risk for infections and bleeding. -signs of infection - fever or chills, cough, sore throat, pain or difficulty passing urine -signs of decreased platelets or bleeding - bruising, pinpoint red spots on the skin, black, tarry stools, nosebleeds -signs of decreased red blood cells - unusually weak or tired, fainting spells, lightheadedness -abdominal pain -breathing problems -dizziness -mouth sores -trouble passing urine or change in the amount of urine Side effects that usually do not require medical attention (report to your doctor or health care professional if they continue or are bothersome): -diarrhea -headache -loss of appetite -nausea, vomiting -pain or redness at the injection site -weak or tired This list may not describe all possible side effects. Call your doctor for medical advice about side effects. You may report side effects to FDA at 1-800-FDA-1088. Where should I keep my medicine? This drug is given in a hospital or clinic and will not be stored at home. NOTE: This sheet is a summary. It may not cover all possible information. If you have questions about this medicine, talk to your doctor, pharmacist, or health care provider.  2019 Elsevier/Gold Standard (2007-06-10 14:42:56)

## 2018-06-03 NOTE — Progress Notes (Signed)
Patient here for follow up. States he feels fatigued.

## 2018-06-03 NOTE — Progress Notes (Signed)
START OFF PATHWAY REGIMEN - Other Dx   [id number]:Cladribine 0.14 mg/kg Subcut D1,2,3,4,5:   One cycle:     Cladribine   **Always confirm dose/schedule in your pharmacy ordering system**  Patient Characteristics: Intent of Therapy: Curative Intent, Discussed with Patient

## 2018-06-03 NOTE — Progress Notes (Signed)
Hematology/Oncology Consult note [facility] Telephone:([phone] [phone]   Patient Care Team: Perrin Maltese, MD as PCP - General (Internal Medicine)  REFERRING PROVIDER: Dr.Khan. REASON FOR VISIT:  Follow up for management of pancytopenia.   HISTORY OF PRESENTING ILLNESS:  Roy Valentine is a  48 y.o.  male with PMH listed below who was referred to me for evaluation of pancytopenia.  Patient recently follows up with pcp and had lab work up done.  Cbc showed hemoglobin of 7, leukopenia with ANC 0.1, thrombocytopenia with platelet count   Fatigue: reports worsening fatigue. Chronic onset, perisistent, for the past few months. no aggravating or improving factors, no associated symptoms.  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    He has ED visit on 08/12/2017 due to cough and fever. CXR showed pneumonia.  Cbc on 08/12/2017 showed wbc 1.7, hemoglobin 16, platelet 247,000.  No differential was checked at that time. Patient was given a course of antibiotics and per patient his symptoms improved.   Denies hematochezia, hematuria, hematemesis, epistaxis, black tarry stool or easy bruising.  Denies fever or chills, any recent frequent infections.  He has lost about 10 pounds since May 2019. Appetite is fair.  INTERVAL HISTORY Roy Valentine is a 48 y.o. male who has above history reviewed by me today presents for follow up visit for management of pancytopenia.  Since last visit, he reports feel well at baseline.  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 Work is lighter since  [employer] closed campus recently.  Denies fever, chills.  Fatigue: reports worsening fatigue. Chronic onset, perisistent, no aggravating or improving factors, no associated symptoms.  He also had US spleen done during the interval. Denies any pain today.   Review of Systems  Constitutional: Positive for fatigue and unexpected weight change. Negative for appetite  change, chills and fever.  HENT:   Negative for hearing loss and voice change.   Eyes: Negative for eye problems and icterus.  Respiratory: Negative for chest tightness, cough and shortness of breath.   Cardiovascular: Negative for chest pain and leg swelling.  Gastrointestinal: Negative for abdominal distention and abdominal pain.  Endocrine: Negative for hot flashes.  Genitourinary: Negative for difficulty urinating, dysuria and frequency.   Musculoskeletal: Negative for arthralgias.  Skin: Negative for itching and rash.  Neurological: Negative for light-headedness and numbness.  Hematological: Negative for adenopathy. Does not bruise/bleed easily.  Psychiatric/Behavioral: Negative for confusion.    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   Hypertension     SURGICAL HISTORY: Past Surgical History:  Procedure Laterality Date   COLON SURGERY      SOCIAL HISTORY: Social History   Socioeconomic History   Marital status: Single    Spouse name: Not on file   Number of children: Not on file   Years of education: Not on file   Highest education level: Not on file  Occupational History   Not on file  Social Needs   Financial resource strain: Not on file   Food insecurity:    Worry: Not on file    Inability: Not on file   Transportation needs:    Medical: Not on file    Non-medical: Not on file  Tobacco Use   Smoking status: Current Every Day Smoker    Packs/day: 1.00    Years: 30.00    Pack years:  30.00   Smokeless tobacco: Never Used  Substance and Sexual Activity   Alcohol use: Never    Frequency: Never   Drug use: Yes    Types: Marijuana    Comment: smoked marijuana in college   Sexual activity: Not on file  Lifestyle   Physical activity:    Days per week: Not on file    Minutes per session: Not on file   Stress: Not on file    Relationships   Social connections:    Talks on phone: Not on file    Gets together: Not on file    Attends religious service: Not on file    Active member of club or organization: Not on file    Attends meetings of clubs or organizations: Not on file    Relationship status: Not on file   Intimate partner violence:    Fear of current or ex partner: Not on file    Emotionally abused: Not on file    Physically abused: Not on file    Forced sexual activity: Not on file  Other Topics Concern   Not on file  Social History Narrative   Not on file    FAMILY HISTORY: Family History  Problem Relation Age of Onset   Thyroid disease Mother    Alzheimer's disease Mother     ALLERGIES:  has No Known Allergies.  MEDICATIONS:  Current Outpatient Medications  Medication Sig Dispense Refill   buPROPion (WELLBUTRIN XL) 150 MG 24 hr tablet Take 150 mg by mouth daily.     ciprofloxacin (CIPRO) 500 MG tablet Take 1 tablet (500 mg total) by mouth daily. 30 tablet 0   lisinopril (PRINIVIL,ZESTRIL) 5 MG tablet Take 5 mg by mouth daily.     nystatin (MYCOSTATIN) 100000 UNIT/ML suspension Take 5 mLs (500,000 Units total) by mouth 4 (four) times daily. 473 mL 0   Vitamin D, Ergocalciferol, (DRISDOL) 1.25 MG (50000 UT) CAPS capsule Take 50,000 Units by mouth once a week.     No current facility-administered medications for this visit.      PHYSICAL EXAMINATION: ECOG PERFORMANCE STATUS: 1 - Symptomatic but completely ambulatory Vitals:   06/03/18 1435  BP: (!) 142/81  Pulse: (!) 111  Resp: 18  Temp: (!) 97.4 F (36.3 C)   Filed Weights   06/03/18 1435  Weight: 186 lb 4.8 oz (84.5 kg)    Physical Exam Constitutional:      General: He is not in acute distress. HENT:     Head: Normocephalic and atraumatic.  Eyes:     General: No scleral icterus.    Pupils: Pupils are equal, round, and reactive to light.  Neck:     Musculoskeletal: Normal range of motion and neck supple.   Cardiovascular:     Rate and Rhythm: Normal rate and regular rhythm.     Heart sounds: Normal heart sounds.  Pulmonary:     Effort: Pulmonary effort is normal. No respiratory distress.     Breath sounds: No wheezing.  Abdominal:     General: Bowel sounds are normal. There is no distension.     Palpations: Abdomen is soft. There is no mass.     Tenderness: There is no abdominal tenderness.  Musculoskeletal: Normal range of motion.        General: No deformity.  Skin:    General: Skin is warm and dry.     Coloration: Skin is pale.     Findings: No erythema or rash.  Neurological:  Mental Status: He is alert and oriented to person, place, and time.     Cranial Nerves: No cranial nerve deficit.     Coordination: Coordination normal.  Psychiatric:        Behavior: Behavior normal.        Thought Content: Thought content normal.     RADIOGRAPHIC STUDIES: I have personally reviewed the radiological images as listed and agreed with the findings in the report.  CMP Latest Ref Rng & Units 05/07/2018  Glucose 70 - 99 mg/dL 149(H)  BUN 6 - 20 mg/dL 15  Creatinine 0.61 - 1.24 mg/dL 0.96  Sodium 135 - 145 mmol/L 138  Potassium 3.5 - 5.1 mmol/L 3.5  Chloride 98 - 111 mmol/L 103  CO2 22 - 32 mmol/L 25  Calcium 8.9 - 10.3 mg/dL 9.5  Total Protein 6.5 - 8.1 g/dL 8.3(H)  Total Bilirubin 0.3 - 1.2 mg/dL 0.6  Alkaline Phos 38 - 126 U/L 51  AST 15 - 41 U/L 28  ALT 0 - 44 U/L 26   CBC Latest Ref Rng & Units 06/03/2018  WBC 4.0 - 10.5 K/uL 1.3(LL)  Hemoglobin 13.0 - 17.0 g/dL 6.6(L)  Hematocrit 39.0 - 52.0 % 20.0(L)  Platelets 150 - 400 K/uL 55(L)    LABORATORY DATA:  I have reviewed the data as listed Lab Results  Component Value Date   WBC 1.3 (LL) 06/03/2018   HGB 6.6 (L) 06/03/2018   HCT 20.0 (L) 06/03/2018   MCV 101.0 (H) 06/03/2018   PLT 55 (L) 06/03/2018   Recent Labs    08/12/17 1908 05/07/18 1556  NA 133* 138  K 2.9* 3.5  CL 94* 103  CO2 26 25  GLUCOSE 154*  149*  BUN 14 15  CREATININE 1.15 0.96  CALCIUM 9.4 9.5  GFRNONAA >60 >60  GFRAA >60 >60  PROT 8.9* 8.3*  ALBUMIN 3.7 4.5  AST 21 28  ALT 22 26  ALKPHOS 66 51  BILITOT 1.3* 0.6   Iron/TIBC/Ferritin/ %Sat    Component Value Date/Time   IRON 158 05/07/2018 1558   TIBC 403 05/07/2018 1558   FERRITIN 634 (H) 05/07/2018 1558   IRONPCTSAT 39 05/07/2018 1558     05/07/2018 peripheral blood flow cytometry showed CD103+, CD11c+, CD 25+, clonal B-cell population detected, representing 3% of the leukocytes, phenotype consistent with hairy cell leukemia.  05/12/2018 bone marrow biopsy Diagnosis Bone Marrow, Aspirate,Biopsy, and Clot, right iliac - HYPERCELLULAR BONE MARROW FOR AGE WITH INVOLVEMENT BY A B-CELL LYMPHOPROLIFERATIVE DISORDER. - SEE COMMENT. PERIPHERAL BLOOD: - PANCYTOPENIA. Diagnosis Note The overall material is suboptimal with lack of aspirate. Limited touch imprints and a core biopsy show extensive involvement by a B-cell lymphoproliferative process of primarily small to medium sized lymphocytes with a predominant interstitial pattern of involvement. Flow cytometric analysis was attempted but much of the material likely represents a peripheralized sample and is not considered entirely contributory. Immunohistochemical stains show that B-cells weakly express cyclin D1 but lack CD5, CD10, CD34, CD138, or TdT expression. 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 A repeat biopsy with fresh core biopsy material for flow cytometric study is strongly recommended for definite and accurate sub-classification of this process. (BNS:ecj 05/14/2018)   ASSESSMENT & PLAN:  1. Hairy cell leukemia not having achieved remission ([place])   2. Symptomatic anemia   3. Neutropenia, unspecified type ([place])   4. Goals of care, counseling/discussion    Bone marrow biopsy was sent to [facility] for second opinion.  Comments as below.  A. Peripheral blood and bone  marrow biopsy; Outside consult, [id number], [facility], [employer], [place]. Date of procedure 05-12-18: Small mature B-cell neoplasm. 70% leukemic infiltrate in a 75% cellular bone marrow. Markedly decreased hematopoiesis with focal moderate reticulin fibrosis. Moderate to marked pancytopenia with monocytopenia. See comment.  Comment: The neoplastic involvement in the bone marrow assumes interstitial infiltrate with aggregates of small mature lymphocytes containing moderate amount of cytoplasm. Abnormal lymphocytes with hairy-like projects are occasionally seen on the aspirate smear, even though the aspirate is apparently hemodilute. Per the outside report, flow cytometric analysis demonstrates 3% lambda light chain restricted B-cell population that is positive for CD103 and CD25. 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 So far, the findings do not support the diagnosis of hairy cell leukemia variant; however, if hairy cell leukemia variant is considered as an alternative, molecular diagnostic test for BRAF [id number] mutation could be performed to rule out the possibility and confirm the diagnosis of hairy cell leukemia. This case was reviewed in our hematopathology QA conference, and a consensus was reached. We appreciate your sending this interesting case in consultation and entirely agree with your assessment and diagnosis of this case.   [facility] pathology opinion was discussed with patient in detail.  BRAF mutation has been sent out and is pending.   The diagnosis of hairy cell leukemia and care plan were discussed with patient in detail.  NCCN guidelines were reviewed and shared with patient. Goal of care is palliative.   Also not curative, treatment can alleviate symptoms reverse cytopenia and prolong survival to a near normal lifespan.  Most  patient can achieve durable remission with long-term treatment free periods followed by further therapy when symptomatic relapse occurs. Recommend purine analogs cladribine- 0.14 mg/kg/day intravenously over two hours once per day for five days.  I explained to the patient the risks and benefits of chemotherapy including all but not limited to infusion reaction, hair loss, hearing loss, mouth sore, nausea, vomiting, low blood counts, bleeding, heart failure, kidney failure and risk of life threatening infection and even death, secondary malignancy etc.   Patient voices understanding and willing to proceed chemotherapy.   # Chemotherapy education;  Antiemetics-Zofran sent to pharmacy # Symptomatic anemia, proceed with 2 unit of irradiated PRBC transfusion tmr.  # Neutropenia, ANC 0.1. continue prophylactic antibiotics with Cipro. Also continue with Nystatin swish and spit oral rinse.  Patient has been educated about increased infection risk and life-threatening infection given her significantly reduced immunity.  He knows to call clinic or go to emergency room immediately if he spikes fever.  Also plan to start patient on Bactrim after he finishes chemotherapy as most patients develop chronic lymphocytopenia after Cladribine treatment.    . Orders Placed This Encounter  Procedures   Comprehensive metabolic panel    Standing Status:   Standing    Number of Occurrences:   20    Standing Expiration Date:   06/03/2019   CBC with Differential    Standing Status:   Standing    Number of Occurrences:   20    Standing Expiration Date:   06/04/2019    All questions were answered. The patient knows to call the clinic with any problems questions or concerns.  Return of visit: 1 week   [name], MD, PhD Hematology Oncology [place] [facility]  Pager- [phone] 06/03/2018

## 2018-06-04 ENCOUNTER — Inpatient Hospital Stay: Payer: BLUE CROSS/BLUE SHIELD

## 2018-06-04 DIAGNOSIS — Z5111 Encounter for antineoplastic chemotherapy: Secondary | ICD-10-CM | POA: Diagnosis not present

## 2018-06-04 DIAGNOSIS — D649 Anemia, unspecified: Secondary | ICD-10-CM

## 2018-06-04 LAB — PREPARE RBC (CROSSMATCH)

## 2018-06-04 MED ORDER — ACETAMINOPHEN 325 MG PO TABS
650.0000 mg | ORAL_TABLET | Freq: Once | ORAL | Status: AC
  Administered 2018-06-04: 650 mg via ORAL
  Filled 2018-06-04: qty 2

## 2018-06-04 MED ORDER — SODIUM CHLORIDE 0.9% IV SOLUTION
250.0000 mL | Freq: Once | INTRAVENOUS | Status: AC
  Administered 2018-06-04: 250 mL via INTRAVENOUS
  Filled 2018-06-04: qty 250

## 2018-06-04 MED ORDER — DIPHENHYDRAMINE HCL 25 MG PO CAPS
25.0000 mg | ORAL_CAPSULE | Freq: Once | ORAL | Status: AC
  Administered 2018-06-04: 25 mg via ORAL
  Filled 2018-06-04: qty 1

## 2018-06-05 LAB — TYPE AND SCREEN
ABO/RH(D): O POS
Antibody Screen: NEGATIVE
UNIT DIVISION: 0

## 2018-06-05 LAB — MISC LABCORP TEST (SEND OUT): Labcorp test code: 481030

## 2018-06-05 LAB — BPAM RBC
Blood Product Expiration Date: 202004092359
ISSUE DATE / TIME: 202003181007
Unit Type and Rh: 5100

## 2018-06-06 ENCOUNTER — Other Ambulatory Visit: Payer: Self-pay

## 2018-06-08 ENCOUNTER — Other Ambulatory Visit: Payer: Self-pay

## 2018-06-09 ENCOUNTER — Inpatient Hospital Stay: Payer: BLUE CROSS/BLUE SHIELD

## 2018-06-09 ENCOUNTER — Inpatient Hospital Stay (HOSPITAL_BASED_OUTPATIENT_CLINIC_OR_DEPARTMENT_OTHER): Payer: BLUE CROSS/BLUE SHIELD | Admitting: Oncology

## 2018-06-09 ENCOUNTER — Other Ambulatory Visit: Payer: Self-pay

## 2018-06-09 ENCOUNTER — Encounter: Payer: Self-pay | Admitting: Oncology

## 2018-06-09 VITALS — BP 131/84 | HR 99 | Temp 96.9°F | Resp 18 | Wt 185.9 lb

## 2018-06-09 DIAGNOSIS — D709 Neutropenia, unspecified: Secondary | ICD-10-CM

## 2018-06-09 DIAGNOSIS — Z5111 Encounter for antineoplastic chemotherapy: Secondary | ICD-10-CM | POA: Diagnosis not present

## 2018-06-09 DIAGNOSIS — D61818 Other pancytopenia: Secondary | ICD-10-CM

## 2018-06-09 DIAGNOSIS — C9140 Hairy cell leukemia not having achieved remission: Secondary | ICD-10-CM

## 2018-06-09 DIAGNOSIS — D649 Anemia, unspecified: Secondary | ICD-10-CM

## 2018-06-09 DIAGNOSIS — Z7189 Other specified counseling: Secondary | ICD-10-CM

## 2018-06-09 LAB — COMPREHENSIVE METABOLIC PANEL
ALT: 20 U/L (ref 0–44)
AST: 19 U/L (ref 15–41)
Albumin: 4.2 g/dL (ref 3.5–5.0)
Alkaline Phosphatase: 53 U/L (ref 38–126)
Anion gap: 8 (ref 5–15)
BUN: 16 mg/dL (ref 6–20)
CO2: 25 mmol/L (ref 22–32)
Calcium: 9.2 mg/dL (ref 8.9–10.3)
Chloride: 101 mmol/L (ref 98–111)
Creatinine, Ser: 1.22 mg/dL (ref 0.61–1.24)
GFR calc Af Amer: >60 mL/min (ref >60)
GFR calc non Af Amer: >60 mL/min (ref >60)
Glucose, Bld: 163 mg/dL — ABNORMAL HIGH (ref 70–99)
Potassium: 4.2 mmol/L (ref 3.5–5.1)
Sodium: 134 mmol/L — ABNORMAL LOW (ref 135–145)
Total Bilirubin: 0.5 mg/dL (ref 0.3–1.2)
Total Protein: 8.1 g/dL (ref 6.5–8.1)

## 2018-06-09 LAB — CBC WITH DIFFERENTIAL/PLATELET
Abs Immature Granulocytes: 0 10*3/uL (ref 0.00–0.07)
Basophils Absolute: 0 10*3/uL (ref 0.0–0.1)
Basophils Relative: 0 %
Eosinophils Absolute: 0 10*3/uL (ref 0.0–0.5)
Eosinophils Relative: 0 %
HCT: 24 % — ABNORMAL LOW (ref 39.0–52.0)
Hemoglobin: 7.9 g/dL — ABNORMAL LOW (ref 13.0–17.0)
IMMATURE GRANULOCYTES: 0 %
Lymphocytes Relative: 87 %
Lymphs Abs: 1.1 10*3/uL (ref 0.7–4.0)
MCH: 31.7 pg (ref 26.0–34.0)
MCHC: 32.9 g/dL (ref 30.0–36.0)
MCV: 96.4 fL (ref 80.0–100.0)
Monocytes Absolute: 0 10*3/uL — ABNORMAL LOW (ref 0.1–1.0)
Monocytes Relative: 3 %
Neutro Abs: 0.1 10*3/uL — ABNORMAL LOW (ref 1.7–7.7)
Neutrophils Relative %: 10 %
Platelets: 51 10*3/uL — ABNORMAL LOW (ref 150–400)
RBC: 2.49 MIL/uL — ABNORMAL LOW (ref 4.22–5.81)
RDW: 22.2 % — ABNORMAL HIGH (ref 11.5–15.5)
WBC: 1.3 10*3/uL — CL (ref 4.0–10.5)
nRBC: 0 % (ref 0.0–0.2)

## 2018-06-09 LAB — SAMPLE TO BLOOD BANK

## 2018-06-09 MED ORDER — PROCHLORPERAZINE MALEATE 10 MG PO TABS
10.0000 mg | ORAL_TABLET | Freq: Once | ORAL | Status: AC
  Administered 2018-06-09: 10 mg via ORAL
  Filled 2018-06-09: qty 1

## 2018-06-09 MED ORDER — SODIUM CHLORIDE 0.9 % IV SOLN
0.1400 mg/kg | Freq: Once | INTRAVENOUS | Status: AC
  Administered 2018-06-09: 12 mg via INTRAVENOUS
  Filled 2018-06-09: qty 12

## 2018-06-09 MED ORDER — SODIUM CHLORIDE 0.9 % IV SOLN
Freq: Once | INTRAVENOUS | Status: AC
  Administered 2018-06-09: 10:00:00 via INTRAVENOUS
  Filled 2018-06-09: qty 250

## 2018-06-09 MED ORDER — CIPROFLOXACIN HCL 500 MG PO TABS: 500.0000 mg | ORAL_TABLET | Freq: Every day | ORAL | 1 refills | Status: DC

## 2018-06-09 NOTE — Progress Notes (Signed)
Patient here for follow up. States he feels a little tired but overall good.

## 2018-06-09 NOTE — Progress Notes (Signed)
Hematology/Oncology Consult note [facility] Telephone:([phone] [phone]   Patient Care Team: Perrin Maltese, MD as PCP - General (Internal Medicine)  REFERRING PROVIDER: Dr.Khan. REASON FOR VISIT:  Follow up for management of pancytopenia.   HISTORY OF PRESENTING ILLNESS:  Roy Valentine is a  48 y.o.  male with PMH listed below who was referred to me for evaluation of pancytopenia.  Patient recently follows up with pcp and had lab work up done.  Cbc showed hemoglobin of 7, leukopenia with ANC 0.1, thrombocytopenia with platelet count   Fatigue: reports worsening fatigue. Chronic onset, perisistent, for the past few months. no aggravating or improving factors, no associated symptoms.  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    He has ED visit on 08/12/2017 due to cough and fever. CXR showed pneumonia.  Cbc on 08/12/2017 showed wbc 1.7, hemoglobin 16, platelet 247,000.  No differential was checked at that time. Patient was given a course of antibiotics and per patient his symptoms improved.   Denies hematochezia, hematuria, hematemesis, epistaxis, black tarry stool or easy bruising.  Denies fever or chills, any recent frequent infections.  He has lost about 10 pounds since May 2019. Appetite is fair.   # Bone marrow biopsy was sent to [facility] for second opinion.  Comments as below.  A. Peripheral blood and bone marrow biopsy; Outside consult, [id number], [facility], [place], [place]. Date of procedure 05-12-18: Small mature B-cell neoplasm. 70% leukemic infiltrate in a 75% cellular bone marrow. Markedly decreased hematopoiesis with focal moderate reticulin fibrosis. Moderate to marked pancytopenia with monocytopenia. See comment.  Comment: The neoplastic involvement in the bone marrow assumes interstitial infiltrate with aggregates of small mature lymphocytes containing moderate amount of cytoplasm. Abnormal  lymphocytes with hairy-like projects are occasionally seen on the aspirate smear, even though the aspirate is apparently hemodilute. Per the outside report, flow cytometric analysis demonstrates 3% lambda light chain restricted B-cell population that is positive for CD103 and CD25. 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 So far, the findings do not support the diagnosis of hairy cell leukemia variant; however, if hairy cell leukemia variant is considered as an alternative, molecular diagnostic test for BRAF [id number] mutation could be performed to rule out the possibility and confirm the diagnosis of hairy cell leukemia. This case was reviewed in our hematopathology QA conference, and a consensus was reached. We appreciate your sending this interesting case in consultation and entirely agree with your assessment and diagnosis of this case.   [facility] pathology opinion was discussed with patient in detail.    INTERVAL HISTORY Roy Valentine is a 48 y.o. 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  Status post 1 unit of PRBC transfusion last week.  Reports fatigue has significantly improved. Denies any fever, chills, pain nausea vomiting. He has been to chemotherapy class. Also reports have filled his antiemetics.  Review of Systems  Constitutional: Positive for fatigue and unexpected weight change. Negative for appetite change, chills and fever.  HENT:   Negative for hearing loss and voice change.   Eyes: Negative for eye problems and icterus.  Respiratory: Negative for chest tightness, cough and shortness of breath.   Cardiovascular: Negative for chest pain and leg swelling.  Gastrointestinal: Negative for abdominal distention and abdominal  pain.  Endocrine: Negative for hot flashes.  Genitourinary: Negative for difficulty urinating, dysuria and frequency.   Musculoskeletal: Negative for arthralgias.  Skin: Negative for itching and rash.  Neurological: Negative for light-headedness and numbness.  Hematological: Negative for adenopathy. Does not bruise/bleed easily.  Psychiatric/Behavioral: Negative for confusion.    MEDICAL HISTORY:  Past Medical History:  Diagnosis Date  . Diabetes mellitus without complication ([place])   . 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 Hypertension     SURGICAL HISTORY: Past Surgical History:  Procedure Laterality Date  . COLON SURGERY      SOCIAL HISTORY: Social History   Socioeconomic History  . Marital status: Single    Spouse name: Not on file  . Number of children: Not on file  . Years of education: Not on file  . Highest education level: Not on file  Occupational History  . Not on file  Social Needs  . Financial resource strain: Not on file  . Food insecurity:    Worry: Not on file    Inability: Not on file  . Transportation needs:    Medical: Not on file    Non-medical: Not on file  Tobacco Use  . Smoking status: Current Every Day Smoker    Packs/day: 1.00    Years: 30.00    Pack years: 30.00  . Smokeless tobacco: Never Used  Substance and Sexual Activity  . Alcohol use: Never    Frequency: Never  . Drug use: Yes    Types: Marijuana    Comment: smoked marijuana in college  . Sexual activity: Not on file  Lifestyle  . Physical activity:    Days per week: Not on file    Minutes per session: Not on file  . Stress: Not on file  Relationships  . Social connections:    Talks on phone: Not on file    Gets together: Not on file    Attends religious service: Not on file    Active member of club or organization: Not on file    Attends meetings of clubs or organizations: Not on file    Relationship status: Not on file  . Intimate partner violence:    Fear of  current or ex partner: Not on file    Emotionally abused: Not on file    Physically abused: Not on file    Forced sexual activity: Not on file  Other Topics Concern  . Not on file  Social History Narrative  . Not on file    FAMILY HISTORY: Family History  Problem Relation Age of Onset  . Thyroid disease Mother   . Alzheimer's disease Mother     ALLERGIES:  has No Known Allergies.  MEDICATIONS:  Current Outpatient Medications  Medication Sig Dispense Refill  . buPROPion (WELLBUTRIN XL) 150 MG 24 hr tablet Take 150 mg by mouth daily.    Marland Kitchen lisinopril (PRINIVIL,ZESTRIL) 5 MG tablet Take 5 mg by mouth daily.    Marland Kitchen nystatin (MYCOSTATIN) 100000 UNIT/ML suspension Take 5 mLs (500,000 Units total) by mouth 4 (four) times daily. 473 mL 0  . ondansetron (ZOFRAN) 8 MG tablet Take 1 tablet (8 mg total) by mouth 2 (two) times daily as needed (Nausea or vomiting). 30 tablet 1  . Vitamin D, Ergocalciferol, (DRISDOL) 1.25 MG (50000 UT) CAPS capsule Take 50,000 Units by mouth once a week.    . ciprofloxacin (CIPRO) 500 MG tablet Take 1 tablet (500 mg total)  by mouth daily. 90 tablet 1   No current facility-administered medications for this visit.    Facility-Administered Medications Ordered in Other Visits  Medication Dose Route Frequency Provider Last Rate Last Dose  . cladribine (LEUSTATIN) 12 mg in sodium chloride 0.9 % 250 mL chemo infusion  0.14 mg/kg (Treatment Plan Recorded) Intravenous Once [name], MD         PHYSICAL EXAMINATION: ECOG PERFORMANCE STATUS: 1 - Symptomatic but completely ambulatory Vitals:   06/09/18 0903  BP: 131/84  Pulse: 99  Resp: 18  Temp: (!) 96.9 F (36.1 C)   Filed Weights   06/09/18 0903  Weight: 185 lb 14.4 oz (84.3 kg)    Physical Exam Constitutional:      General: He is not in acute distress. HENT:     Head: Normocephalic and atraumatic.  Eyes:     General: No scleral icterus.    Pupils: Pupils are equal, round, and reactive to light.   Neck:     Musculoskeletal: Normal range of motion and neck supple.  Cardiovascular:     Rate and Rhythm: Normal rate and regular rhythm.     Heart sounds: Normal heart sounds.  Pulmonary:     Effort: Pulmonary effort is normal. No respiratory distress.     Breath sounds: No wheezing.  Abdominal:     General: Bowel sounds are normal. There is no distension.     Palpations: Abdomen is soft. There is no mass.     Tenderness: There is no abdominal tenderness.  Musculoskeletal: Normal range of motion.        General: No deformity.  Skin:    General: Skin is warm and dry.     Coloration: Skin is pale.     Findings: No erythema or rash.  Neurological:     Mental Status: He is alert and oriented to person, place, and time.     Cranial Nerves: No cranial nerve deficit.     Coordination: Coordination normal.  Psychiatric:        Behavior: Behavior normal.        Thought Content: Thought content normal.     RADIOGRAPHIC STUDIES: I have personally reviewed the radiological images as listed and agreed with the findings in the report.  CMP Latest Ref Rng & Units 06/09/2018  Glucose 70 - 99 mg/dL 163(H)  BUN 6 - 20 mg/dL 16  Creatinine 0.61 - 1.24 mg/dL 1.22  Sodium 135 - 145 mmol/L 134(L)  Potassium 3.5 - 5.1 mmol/L 4.2  Chloride 98 - 111 mmol/L 101  CO2 22 - 32 mmol/L 25  Calcium 8.9 - 10.3 mg/dL 9.2  Total Protein 6.5 - 8.1 g/dL 8.1  Total Bilirubin 0.3 - 1.2 mg/dL 0.5  Alkaline Phos 38 - 126 U/L 53  AST 15 - 41 U/L 19  ALT 0 - 44 U/L 20   CBC Latest Ref Rng & Units 06/09/2018  WBC 4.0 - 10.5 K/uL 1.3(LL)  Hemoglobin 13.0 - 17.0 g/dL 7.9(L)  Hematocrit 39.0 - 52.0 % 24.0(L)  Platelets 150 - 400 K/uL 51(L)    LABORATORY DATA:  I have reviewed the data as listed Lab Results  Component Value Date   WBC 1.3 (LL) 06/09/2018   HGB 7.9 (L) 06/09/2018   HCT 24.0 (L) 06/09/2018   MCV 96.4 06/09/2018   PLT 51 (L) 06/09/2018   Recent Labs    08/12/17 1908 05/07/18 1556  06/09/18 0838  NA 133* 138 134*  K 2.9* 3.5 4.2  CL 94* 103 101  CO2 _0 GLUCOSE 154* 149* 163*  BUN _1 CREATININE 1.15 0.96 1.22  CALCIUM 9.4 9.5 9.2  GFRNONAA >60 >60 >60  GFRAA >60 >60 >60  PROT 8.9* 8.3* 8.1  ALBUMIN 3.7 4.5 4.2  AST _2 ALT _3 ALKPHOS 66 51 53  BILITOT 1.3* 0.6 0.5   Iron/TIBC/Ferritin/ %Sat    Component Value Date/Time   IRON 158 05/07/2018 1558   TIBC 403 05/07/2018 1558   FERRITIN 634 (H) 05/07/2018 1558   IRONPCTSAT 39 05/07/2018 1558     05/07/2018 peripheral blood flow cytometry showed CD103+, CD11c+, CD 25+, clonal B-cell population detected, representing 3% of the leukocytes, phenotype consistent with hairy cell leukemia.  05/12/2018 bone marrow biopsy Diagnosis Bone Marrow, Aspirate,Biopsy, and Clot, right iliac - HYPERCELLULAR BONE MARROW FOR AGE WITH INVOLVEMENT BY A B-CELL LYMPHOPROLIFERATIVE DISORDER. - SEE COMMENT. PERIPHERAL BLOOD: - PANCYTOPENIA. Diagnosis Note The overall material is suboptimal with lack of aspirate. Limited touch imprints and a core biopsy show extensive involvement by a B-cell lymphoproliferative process of primarily small to medium sized lymphocytes with a predominant interstitial pattern of involvement. Flow cytometric analysis was attempted but much of the material likely represents a peripheralized sample and is not considered entirely contributory. Immunohistochemical stains show that B-cells weakly express cyclin D1 but lack CD5, CD10, CD34, CD138, or TdT expression. 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 A repeat biopsy with fresh core biopsy material for flow cytometric study is strongly recommended for definite and accurate sub-classification of this process. (BNS:ecj 05/14/2018)   ASSESSMENT & PLAN:  1. 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 Symptomatic anemia   3. Pancytopenia ([place])   4. Neutropenia, unspecified type ([place])    5. Goals of care, counseling/discussion    BBRAF mutation negative.  #Hairy cell leukemia.  Labs are reviewed and discussed with patient.  Hemoglobin has improved since PRBC transfusion. Okay to proceed with purine analogs cladribine- 0.14 mg/kg/day intravenously over two hours once per day for five days.   Neutropenia, continue prophylactic antibiotics with Cipro.  Prescription was reviewed for 90-day supply. Continue nystatin swish and spit oral rinse.  Neutropenia precaution emphasized.  #Anemia, status post 1 unit of PRBC transfusion.  Hemoglobin improved to 7.9.  Continue to monitor. #Thrombocytopenia, stable.  Continue to monitor.  Recommend patient to have weekly CBC and hope to start. Follow-up in the clinic with lab assessment and MD assessment in 3 weeks.   All questions were answered. The patient knows to call the clinic with any problems questions or concerns. We spent sufficient time to discuss many aspect of care, questions were answered to patient's satisfaction. Total face to face encounter time for this patient visit was 25 min. >50% of the time was  spent in counseling and coordination of care.    [name], MD, PhD Hematology Oncology [facility] at [facility] Pager- [phone] 06/09/2018

## 2018-06-10 ENCOUNTER — Other Ambulatory Visit: Payer: Self-pay

## 2018-06-10 ENCOUNTER — Inpatient Hospital Stay: Payer: BLUE CROSS/BLUE SHIELD

## 2018-06-10 VITALS — BP 122/74 | HR 100 | Temp 98.5°F | Resp 18 | Wt 188.0 lb

## 2018-06-10 DIAGNOSIS — C9140 Hairy cell leukemia not having achieved remission: Secondary | ICD-10-CM

## 2018-06-10 DIAGNOSIS — Z5111 Encounter for antineoplastic chemotherapy: Secondary | ICD-10-CM | POA: Diagnosis not present

## 2018-06-10 MED ORDER — SODIUM CHLORIDE 0.9 % IV SOLN
Freq: Once | INTRAVENOUS | Status: AC
  Administered 2018-06-10: 13:00:00 via INTRAVENOUS
  Filled 2018-06-10: qty 250

## 2018-06-10 MED ORDER — PROCHLORPERAZINE MALEATE 10 MG PO TABS
10.0000 mg | ORAL_TABLET | Freq: Once | ORAL | Status: AC
  Administered 2018-06-10: 10 mg via ORAL
  Filled 2018-06-10: qty 1

## 2018-06-10 MED ORDER — SODIUM CHLORIDE 0.9 % IV SOLN
0.1400 mg/kg | Freq: Once | INTRAVENOUS | Status: AC
  Administered 2018-06-10: 12 mg via INTRAVENOUS
  Filled 2018-06-10: qty 12

## 2018-06-11 ENCOUNTER — Other Ambulatory Visit: Payer: Self-pay

## 2018-06-11 ENCOUNTER — Encounter (INDEPENDENT_AMBULATORY_CARE_PROVIDER_SITE_OTHER): Payer: Self-pay

## 2018-06-11 ENCOUNTER — Inpatient Hospital Stay: Payer: BLUE CROSS/BLUE SHIELD

## 2018-06-11 VITALS — BP 132/79 | HR 92 | Temp 98.3°F | Resp 20

## 2018-06-11 DIAGNOSIS — C9140 Hairy cell leukemia not having achieved remission: Secondary | ICD-10-CM

## 2018-06-11 DIAGNOSIS — Z5111 Encounter for antineoplastic chemotherapy: Secondary | ICD-10-CM | POA: Diagnosis not present

## 2018-06-11 MED ORDER — PROCHLORPERAZINE MALEATE 10 MG PO TABS
10.0000 mg | ORAL_TABLET | Freq: Once | ORAL | Status: AC
  Administered 2018-06-11: 10 mg via ORAL
  Filled 2018-06-11: qty 1

## 2018-06-11 MED ORDER — SODIUM CHLORIDE 0.9 % IV SOLN
Freq: Once | INTRAVENOUS | Status: AC
  Administered 2018-06-11: 10:00:00 via INTRAVENOUS
  Filled 2018-06-11: qty 250

## 2018-06-11 MED ORDER — SODIUM CHLORIDE 0.9 % IV SOLN
0.1400 mg/kg | Freq: Once | INTRAVENOUS | Status: AC
  Administered 2018-06-11: 12 mg via INTRAVENOUS
  Filled 2018-06-11: qty 12

## 2018-06-12 ENCOUNTER — Inpatient Hospital Stay: Payer: BLUE CROSS/BLUE SHIELD

## 2018-06-12 ENCOUNTER — Other Ambulatory Visit: Payer: Self-pay

## 2018-06-12 VITALS — BP 117/78 | HR 99 | Temp 98.2°F | Resp 18

## 2018-06-12 DIAGNOSIS — Z5111 Encounter for antineoplastic chemotherapy: Secondary | ICD-10-CM | POA: Diagnosis not present

## 2018-06-12 DIAGNOSIS — C9140 Hairy cell leukemia not having achieved remission: Secondary | ICD-10-CM

## 2018-06-12 MED ORDER — SODIUM CHLORIDE 0.9 % IV SOLN
0.1400 mg/kg | Freq: Once | INTRAVENOUS | Status: AC
  Administered 2018-06-12: 12 mg via INTRAVENOUS
  Filled 2018-06-12: qty 12

## 2018-06-12 MED ORDER — SODIUM CHLORIDE 0.9 % IV SOLN
Freq: Once | INTRAVENOUS | Status: AC
  Administered 2018-06-12: 10:00:00 via INTRAVENOUS
  Filled 2018-06-12: qty 250

## 2018-06-12 MED ORDER — PROCHLORPERAZINE MALEATE 10 MG PO TABS
10.0000 mg | ORAL_TABLET | Freq: Once | ORAL | Status: AC
  Administered 2018-06-12: 10 mg via ORAL
  Filled 2018-06-12: qty 1

## 2018-06-13 ENCOUNTER — Inpatient Hospital Stay (HOSPITAL_BASED_OUTPATIENT_CLINIC_OR_DEPARTMENT_OTHER): Payer: BLUE CROSS/BLUE SHIELD | Admitting: Hospice and Palliative Medicine

## 2018-06-13 ENCOUNTER — Inpatient Hospital Stay: Payer: BLUE CROSS/BLUE SHIELD

## 2018-06-13 ENCOUNTER — Other Ambulatory Visit: Payer: Self-pay

## 2018-06-13 VITALS — BP 119/78 | HR 99 | Temp 96.7°F | Resp 18

## 2018-06-13 DIAGNOSIS — C9140 Hairy cell leukemia not having achieved remission: Secondary | ICD-10-CM | POA: Diagnosis not present

## 2018-06-13 DIAGNOSIS — I10 Essential (primary) hypertension: Secondary | ICD-10-CM

## 2018-06-13 DIAGNOSIS — D61818 Other pancytopenia: Secondary | ICD-10-CM | POA: Diagnosis not present

## 2018-06-13 DIAGNOSIS — Z5111 Encounter for antineoplastic chemotherapy: Secondary | ICD-10-CM | POA: Diagnosis not present

## 2018-06-13 DIAGNOSIS — E119 Type 2 diabetes mellitus without complications: Secondary | ICD-10-CM | POA: Diagnosis not present

## 2018-06-13 DIAGNOSIS — F17210 Nicotine dependence, cigarettes, uncomplicated: Secondary | ICD-10-CM

## 2018-06-13 DIAGNOSIS — R5383 Other fatigue: Secondary | ICD-10-CM

## 2018-06-13 MED ORDER — SODIUM CHLORIDE 0.9 % IV SOLN
Freq: Once | INTRAVENOUS | Status: AC
  Administered 2018-06-13: 10:00:00 via INTRAVENOUS
  Filled 2018-06-13: qty 250

## 2018-06-13 MED ORDER — PROCHLORPERAZINE MALEATE 10 MG PO TABS
10.0000 mg | ORAL_TABLET | Freq: Once | ORAL | Status: AC
  Administered 2018-06-13: 10 mg via ORAL
  Filled 2018-06-13: qty 1

## 2018-06-13 MED ORDER — SODIUM CHLORIDE 0.9 % IV SOLN
0.1400 mg/kg | Freq: Once | INTRAVENOUS | Status: AC
  Administered 2018-06-13: 12 mg via INTRAVENOUS
  Filled 2018-06-13: qty 12

## 2018-06-13 NOTE — Progress Notes (Signed)
[place]  Telephone:([phone] [phone]  Patient Care Team: Perrin Maltese, MD as PCP - General (Internal Medicine)   Name of the patient: Roy Valentine  [id number]  09-27-1970   Date of Visit: 06/13/18  Diagnosis: Hairy cell leukemia  Current Treatment: Cladribine   Reason for Visit: This patient is a 48 y.o. male who presents to chemo care clinic today for initial meeting in preparation for starting chemotherapy. I introduced the chemo care clinic and we discussed that the role of the clinic is to assist those who are at an increased risk of emergency room visits and/or complications during the course of chemotherapy treatment. We discussed that the increased risk takes into account factors such as age, performance status, and co-morbidities. We also discussed that for some, this might include barriers to care such as not having a primary care provider, lack of insurance/transportation, or not being able to afford medications. We discussed that the goal of the program is to help prevent unplanned ER visits and help reduce complications during chemotherapy. We do this by discussing specific risk factors to each individual and identifying ways that we can help improve these risk factors and reduce barriers to care.   Hematology/Oncology History:    Hairy cell leukemia ([place])   06/03/2018 Initial Diagnosis    Hairy cell leukemia ([place])    06/09/2018 -  Chemotherapy    The patient had cladribine (LEUSTATIN) 12 mg in sodium chloride 0.9 % 250 mL chemo infusion, 0.14 mg/kg = 12 mg (93.3 % of original dose 0.15 mg/kg), Intravenous,  Once, 1 of 1 cycle Dose modification: 0.14 mg/kg (original dose 0.15 mg/kg, Cycle 1, Reason: Provider Judgment) Administration: 12 mg (06/09/2018), 12 mg (06/10/2018), 12 mg (06/11/2018), 12 mg (06/12/2018)  for chemotherapy treatment.       No Known Allergies   Past Medical History:   Diagnosis Date  . Diabetes mellitus without complication ([place])   . 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 Hypertension      Past Surgical History:  Procedure Laterality Date  . COLON SURGERY      Social History   Socioeconomic History  . Marital status: Single    Spouse name: Not on file  . Number of children: Not on file  . Years of education: Not on file  . Highest education level: Not on file  Occupational History  . Not on file  Social Needs  . Financial resource strain: Not on file  . Food insecurity:    Worry: Not on file    Inability: Not on file  . Transportation needs:    Medical: Not on file    Non-medical: Not on file  Tobacco Use  . Smoking status: Current Every Day Smoker    Packs/day: 1.00    Years: 30.00    Pack years: 30.00  . Smokeless tobacco: Never Used  Substance and Sexual Activity  . Alcohol use: Never    Frequency: Never  . Drug use: Yes    Types: Marijuana    Comment: smoked marijuana in college  . Sexual activity: Not on file  Lifestyle  . Physical activity:    Days per week: Not on file    Minutes per session: Not on file  . Stress: Not on file  Relationships  . Social connections:    Talks on phone: Not on file    Gets together: Not on file    Attends religious service:  Not on file    Active member of club or organization: Not on file    Attends meetings of clubs or organizations: Not on file    Relationship status: Not on file  . Intimate partner violence:    Fear of current or ex partner: Not on file    Emotionally abused: Not on file    Physically abused: Not on file    Forced sexual activity: Not on file  Other Topics Concern  . Not on file  Social History Narrative  . Not on file    Family History  Problem Relation Age of Onset  . Thyroid disease Mother   . Alzheimer's disease Mother     Current Outpatient Medications  Medication Sig Dispense Refill  . buPROPion (WELLBUTRIN XL) 150 MG 24 hr tablet Take 150 mg  by mouth daily.    . ciprofloxacin (CIPRO) 500 MG tablet Take 1 tablet (500 mg total) by mouth daily. 90 tablet 1  . lisinopril (PRINIVIL,ZESTRIL) 5 MG tablet Take 5 mg by mouth daily.    Marland Kitchen nystatin (MYCOSTATIN) 100000 UNIT/ML suspension Take 5 mLs (500,000 Units total) by mouth 4 (four) times daily. 473 mL 0  . ondansetron (ZOFRAN) 8 MG tablet Take 1 tablet (8 mg total) by mouth 2 (two) times daily as needed (Nausea or vomiting). 30 tablet 1  . Vitamin D, Ergocalciferol, (DRISDOL) 1.25 MG (50000 UT) CAPS capsule Take 50,000 Units by mouth once a week.     No current facility-administered medications for this visit.    Facility-Administered Medications Ordered in Other Visits  Medication Dose Route Frequency Provider Last Rate Last Dose  . cladribine (LEUSTATIN) 12 mg in sodium chloride 0.9 % 250 mL chemo infusion  0.14 mg/kg (Treatment Plan Recorded) Intravenous Once [name], MD 131 mL/hr at 06/13/18 1005 12 mg at 06/13/18 1005     PERFORMANCE STATUS (ECOG) : 0 - Asymptomatic  Review of Systems As noted above. Otherwise, a complete review of systems is negative.  Physical Exam General: NAD, frail appearing Cardiovascular: regular rate and rhythm Pulmonary: clear ant fields Abdomen: soft, nontender, + bowel sounds GU: no suprapubic tenderness Extremities: no edema, no joint deformities Skin: no rashes Neurological: Weakness but otherwise nonfocal   Assessment and Plan:    1. Cancer: Hairy cell leukemia being started on treatment with cladribine and followed by Dr. Tasia Catchings.  2. High Risk for ER/Hospitalization during Chemotherapy: We discussed the role of the chemo care clinic and identified patient specific risk factors. I discussed that patient was identified as high risk primarily based on: Comorbidities and recent ER visit. We also discussed the role of the Symptom Management and Palliative Care Clinics at [facility] and methods of contacting clinic/provider. He denies needing specific  assistance at this time.  Current PCP: Perrin Maltese, MD  Hospital Admissions: 0  ED Visits: 1  Has Medicaid: No  Has Medicare: No  In relationship: No  Has Anemia: Yes  Has asthma: No  Has atrial fibrillation: No  Has CVD: No  Has chronic kidney disease: No  Has Chronic Obstructive Pulmonary Disease: No  Has Congestive Heart Failure: No  Has Connective Tissue Disorder: No  Has Depression: No  Has Diabetes: No  Has liver disease: No  Has Peripheral Vascular Disease: No     3. Social Determinants of Health:   Housing - lives at home alone.  No housing insecurity reported  Food -no food insecurity reported.  Discussed resources available in the cancer center  including food bank.  Transportation -has a car and drives himself.  Utilities -denies any concerns  Safety -denies any concerns  Financial Strain -currently out of work due to COVID-19.  Discussed resources including referral to [place], [place] needed.  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  He is unmarried and has no children but has many friends and siblings are involved.  Education -attended college  Physical Activity -active and functionally independent with care  Substance Use -current tobacco abuse  Mental Health -says he is coping well with the illness  Disabilities -denies   4. Co-morbidities Complicating Care: Diabetes, hypertension, current tobacco abuse.  Patient is followed actively by his PCP.  He denies any concerns today regarding his medications and reports his comorbidities are well controlled.  Recommend tobacco cessation program.   Patient expressed understanding and was in agreement with this plan. He also understands that He can call clinic at any time with any questions, concerns, or complaints.   A total of (15) minutes of face-to-face time was spent with this patient with  greater than 50% of that time in counseling and care-coordination.   Signed by: Altha Harm, PhD, DNP, NP-C, [facility] [phone] (Work Cell)

## 2018-06-16 ENCOUNTER — Inpatient Hospital Stay: Payer: BLUE CROSS/BLUE SHIELD

## 2018-06-16 ENCOUNTER — Other Ambulatory Visit: Payer: Self-pay

## 2018-06-16 DIAGNOSIS — D649 Anemia, unspecified: Secondary | ICD-10-CM

## 2018-06-16 DIAGNOSIS — Z5111 Encounter for antineoplastic chemotherapy: Secondary | ICD-10-CM | POA: Diagnosis not present

## 2018-06-16 DIAGNOSIS — C9140 Hairy cell leukemia not having achieved remission: Secondary | ICD-10-CM

## 2018-06-16 LAB — COMPREHENSIVE METABOLIC PANEL
ALK PHOS: 41 U/L (ref 38–126)
ALT: 14 U/L (ref 0–44)
ANION GAP: 9 (ref 5–15)
AST: 15 U/L (ref 15–41)
Albumin: 3.9 g/dL (ref 3.5–5.0)
BUN: 18 mg/dL (ref 6–20)
CO2: 24 mmol/L (ref 22–32)
Calcium: 8.9 mg/dL (ref 8.9–10.3)
Chloride: 101 mmol/L (ref 98–111)
Creatinine, Ser: 1.04 mg/dL (ref 0.61–1.24)
GFR calc Af Amer: >60 mL/min (ref >60)
GFR calc non Af Amer: >60 mL/min (ref >60)
GLUCOSE: 150 mg/dL — AB (ref 70–99)
Potassium: 4.3 mmol/L (ref 3.5–5.1)
Sodium: 134 mmol/L — ABNORMAL LOW (ref 135–145)
TOTAL PROTEIN: 7.8 g/dL (ref 6.5–8.1)
Total Bilirubin: 0.9 mg/dL (ref 0.3–1.2)

## 2018-06-16 LAB — CBC WITH DIFFERENTIAL/PLATELET
Abs Immature Granulocytes: 0 10*3/uL (ref 0.00–0.07)
Basophils Absolute: 0 10*3/uL (ref 0.0–0.1)
Basophils Relative: 0 %
Eosinophils Absolute: 0 10*3/uL (ref 0.0–0.5)
Eosinophils Relative: 3 %
HCT: 19.8 % — ABNORMAL LOW (ref 39.0–52.0)
Hemoglobin: 6.6 g/dL — ABNORMAL LOW (ref 13.0–17.0)
Immature Granulocytes: 0 %
Lymphocytes Relative: 72 %
Lymphs Abs: 0.3 10*3/uL — ABNORMAL LOW (ref 0.7–4.0)
MCH: 31.9 pg (ref 26.0–34.0)
MCHC: 33.3 g/dL (ref 30.0–36.0)
MCV: 95.7 fL (ref 80.0–100.0)
MONO ABS: 0 10*3/uL — AB (ref 0.1–1.0)
Monocytes Relative: 0 %
NEUTROS ABS: 0.1 10*3/uL — AB (ref 1.7–7.7)
Neutrophils Relative %: 25 %
Platelets: 41 10*3/uL — ABNORMAL LOW (ref 150–400)
RBC: 2.07 MIL/uL — ABNORMAL LOW (ref 4.22–5.81)
RDW: 21.8 % — ABNORMAL HIGH (ref 11.5–15.5)
Smear Review: NORMAL
WBC: 0.4 10*3/uL — CL (ref 4.0–10.5)
nRBC: 0 % (ref 0.0–0.2)

## 2018-06-16 LAB — SAMPLE TO BLOOD BANK

## 2018-06-19 ENCOUNTER — Other Ambulatory Visit: Payer: Self-pay | Admitting: Oncology

## 2018-06-19 DIAGNOSIS — D649 Anemia, unspecified: Secondary | ICD-10-CM

## 2018-06-19 DIAGNOSIS — C9140 Hairy cell leukemia not having achieved remission: Secondary | ICD-10-CM

## 2018-06-20 ENCOUNTER — Other Ambulatory Visit: Payer: Self-pay

## 2018-06-20 ENCOUNTER — Inpatient Hospital Stay: Payer: BLUE CROSS/BLUE SHIELD | Attending: Oncology

## 2018-06-20 ENCOUNTER — Other Ambulatory Visit: Payer: Self-pay | Admitting: Oncology

## 2018-06-20 ENCOUNTER — Inpatient Hospital Stay: Payer: BLUE CROSS/BLUE SHIELD

## 2018-06-20 DIAGNOSIS — C9140 Hairy cell leukemia not having achieved remission: Secondary | ICD-10-CM | POA: Insufficient documentation

## 2018-06-20 DIAGNOSIS — D649 Anemia, unspecified: Secondary | ICD-10-CM

## 2018-06-20 DIAGNOSIS — R634 Abnormal weight loss: Secondary | ICD-10-CM | POA: Diagnosis not present

## 2018-06-20 DIAGNOSIS — D61818 Other pancytopenia: Secondary | ICD-10-CM | POA: Diagnosis not present

## 2018-06-20 DIAGNOSIS — R000 Tachycardia, unspecified: Secondary | ICD-10-CM | POA: Insufficient documentation

## 2018-06-20 DIAGNOSIS — E871 Hypo-osmolality and hyponatremia: Secondary | ICD-10-CM | POA: Insufficient documentation

## 2018-06-20 LAB — CBC WITH DIFFERENTIAL/PLATELET
Abs Immature Granulocytes: 0 10*3/uL (ref 0.00–0.07)
Basophils Absolute: 0 10*3/uL (ref 0.0–0.1)
Basophils Relative: 0 %
Eosinophils Absolute: 0 10*3/uL (ref 0.0–0.5)
Eosinophils Relative: 3 %
HCT: 16.9 % — ABNORMAL LOW (ref 39.0–52.0)
Hemoglobin: 5.6 g/dL — ABNORMAL LOW (ref 13.0–17.0)
Immature Granulocytes: 0 %
Lymphocytes Relative: 83 %
Lymphs Abs: 0.3 10*3/uL — ABNORMAL LOW (ref 0.7–4.0)
MCH: 31.8 pg (ref 26.0–34.0)
MCHC: 33.1 g/dL (ref 30.0–36.0)
MCV: 96 fL (ref 80.0–100.0)
Monocytes Absolute: 0 10*3/uL — ABNORMAL LOW (ref 0.1–1.0)
Monocytes Relative: 3 %
Neutro Abs: 0 10*3/uL — ABNORMAL LOW (ref 1.7–7.7)
Neutrophils Relative %: 11 %
Platelets: 40 10*3/uL — ABNORMAL LOW (ref 150–400)
RBC: 1.76 MIL/uL — ABNORMAL LOW (ref 4.22–5.81)
RDW: 21.2 % — ABNORMAL HIGH (ref 11.5–15.5)
Smear Review: DECREASED
WBC: 0.4 10*3/uL — CL (ref 4.0–10.5)
nRBC: 0 % (ref 0.0–0.2)

## 2018-06-20 LAB — COMPREHENSIVE METABOLIC PANEL
ALT: 16 U/L (ref 0–44)
AST: 18 U/L (ref 15–41)
Albumin: 4.2 g/dL (ref 3.5–5.0)
Alkaline Phosphatase: 45 U/L (ref 38–126)
Anion gap: 8 (ref 5–15)
BUN: 21 mg/dL — ABNORMAL HIGH (ref 6–20)
CO2: 24 mmol/L (ref 22–32)
Calcium: 8.9 mg/dL (ref 8.9–10.3)
Chloride: 100 mmol/L (ref 98–111)
Creatinine, Ser: 1.23 mg/dL (ref 0.61–1.24)
GFR calc Af Amer: >60 mL/min (ref >60)
GFR calc non Af Amer: >60 mL/min (ref >60)
Glucose, Bld: 194 mg/dL — ABNORMAL HIGH (ref 70–99)
Potassium: 4.3 mmol/L (ref 3.5–5.1)
Sodium: 132 mmol/L — ABNORMAL LOW (ref 135–145)
Total Bilirubin: 1.2 mg/dL (ref 0.3–1.2)
Total Protein: 8.3 g/dL — ABNORMAL HIGH (ref 6.5–8.1)

## 2018-06-20 LAB — PREPARE RBC (CROSSMATCH)

## 2018-06-20 MED ORDER — SODIUM CHLORIDE 0.9% FLUSH
3.0000 mL | INTRAVENOUS | Status: DC | PRN
  Filled 2018-06-20: qty 3

## 2018-06-20 MED ORDER — ACETAMINOPHEN 325 MG PO TABS
650.0000 mg | ORAL_TABLET | Freq: Once | ORAL | Status: AC
  Administered 2018-06-20: 650 mg via ORAL
  Filled 2018-06-20: qty 2

## 2018-06-20 MED ORDER — SODIUM CHLORIDE 0.9% IV SOLUTION
250.0000 mL | Freq: Once | INTRAVENOUS | Status: AC
  Administered 2018-06-20: 11:00:00 250 mL via INTRAVENOUS
  Filled 2018-06-20: qty 250

## 2018-06-20 MED ORDER — DIPHENHYDRAMINE HCL 25 MG PO CAPS
25.0000 mg | ORAL_CAPSULE | Freq: Once | ORAL | Status: AC
  Administered 2018-06-20: 25 mg via ORAL
  Filled 2018-06-20: qty 1

## 2018-06-21 ENCOUNTER — Emergency Department: Payer: BLUE CROSS/BLUE SHIELD

## 2018-06-21 ENCOUNTER — Encounter: Payer: Self-pay | Admitting: *Deleted

## 2018-06-21 ENCOUNTER — Other Ambulatory Visit: Payer: Self-pay

## 2018-06-21 ENCOUNTER — Emergency Department
Admission: EM | Admit: 2018-06-21 | Discharge: 2018-06-21 | Disposition: A | Discharge status: Home / Self Care | Payer: BLUE CROSS/BLUE SHIELD | Attending: Emergency Medicine | Admitting: Emergency Medicine

## 2018-06-21 DIAGNOSIS — Z79899 Other long term (current) drug therapy: Secondary | ICD-10-CM | POA: Diagnosis not present

## 2018-06-21 DIAGNOSIS — I10 Essential (primary) hypertension: Secondary | ICD-10-CM | POA: Insufficient documentation

## 2018-06-21 DIAGNOSIS — R509 Fever, unspecified: Secondary | ICD-10-CM | POA: Diagnosis not present

## 2018-06-21 DIAGNOSIS — F17200 Nicotine dependence, unspecified, uncomplicated: Secondary | ICD-10-CM | POA: Insufficient documentation

## 2018-06-21 DIAGNOSIS — E119 Type 2 diabetes mellitus without complications: Secondary | ICD-10-CM | POA: Diagnosis not present

## 2018-06-21 LAB — BPAM RBC
Blood Product Expiration Date: 202004082359
Blood Product Expiration Date: 202004082359
ISSUE DATE / TIME: 202004031113
ISSUE DATE / TIME: 202004031301
Unit Type and Rh: 9500
Unit Type and Rh: 9500

## 2018-06-21 LAB — TYPE AND SCREEN
ABO/RH(D): O POS
Antibody Screen: NEGATIVE
Unit division: 0
Unit division: 0

## 2018-06-21 LAB — CBC WITH DIFFERENTIAL/PLATELET
Abs Immature Granulocytes: 0 10*3/uL (ref 0.00–0.07)
Basophils Absolute: 0 10*3/uL (ref 0.0–0.1)
Basophils Relative: 0 %
Eosinophils Absolute: 0 10*3/uL (ref 0.0–0.5)
Eosinophils Relative: 0 %
HCT: 20.7 % — ABNORMAL LOW (ref 39.0–52.0)
Hemoglobin: 7.1 g/dL — ABNORMAL LOW (ref 13.0–17.0)
Immature Granulocytes: 0 %
Lymphocytes Relative: 85 %
Lymphs Abs: 0.4 10*3/uL — ABNORMAL LOW (ref 0.7–4.0)
MCH: 31.3 pg (ref 26.0–34.0)
MCHC: 34.3 g/dL (ref 30.0–36.0)
MCV: 91.2 fL (ref 80.0–100.0)
Monocytes Absolute: 0 10*3/uL — ABNORMAL LOW (ref 0.1–1.0)
Monocytes Relative: 0 %
Neutro Abs: 0.1 10*3/uL — ABNORMAL LOW (ref 1.7–7.7)
Neutrophils Relative %: 15 %
Platelets: 33 10*3/uL — ABNORMAL LOW (ref 150–400)
RBC: 2.27 MIL/uL — ABNORMAL LOW (ref 4.22–5.81)
RDW: 19.6 % — ABNORMAL HIGH (ref 11.5–15.5)
Smear Review: DECREASED
WBC: 0.4 10*3/uL — CL (ref 4.0–10.5)
nRBC: 0 % (ref 0.0–0.2)

## 2018-06-21 LAB — BASIC METABOLIC PANEL
Anion gap: 12 (ref 5–15)
BUN: 19 mg/dL (ref 6–20)
CO2: 21 mmol/L — ABNORMAL LOW (ref 22–32)
Calcium: 8.8 mg/dL — ABNORMAL LOW (ref 8.9–10.3)
Chloride: 97 mmol/L — ABNORMAL LOW (ref 98–111)
Creatinine, Ser: 1.05 mg/dL (ref 0.61–1.24)
GFR calc Af Amer: >60 mL/min (ref >60)
GFR calc non Af Amer: >60 mL/min (ref >60)
Glucose, Bld: 123 mg/dL — ABNORMAL HIGH (ref 70–99)
Potassium: 3.8 mmol/L (ref 3.5–5.1)
Sodium: 130 mmol/L — ABNORMAL LOW (ref 135–145)

## 2018-06-21 NOTE — Discharge Instructions (Addendum)
Follow all of the coronavirus self-isolation and social distancing guidelines that we discussed, as you are at increased risk of infection.   Return to the ER for new or worsening fever, persistent fever, shortness of breath, weakness, or any other new or worsening symptoms that concern you.  Call Dr. Tasia Catchings on Monday and follow up as instructed.

## 2018-06-21 NOTE — ED Notes (Addendum)
RN called lab to check on blood work results. Result ready per lab tech and will be resulted soon.   Critical read over the phone - WBC 0.4. MD made aware.

## 2018-06-21 NOTE — ED Provider Notes (Signed)
[facility] Emergency Department Provider Note ____________________________________________   First MD Initiated Contact with Patient 06/21/18 2057     (approximate)  I have reviewed the triage vital signs and the nursing notes.   HISTORY  Chief Complaint Fever    HPI Roy Valentine is a 48 y.o. 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  He states that he has had a mild cough as well.  He denies any shortness of breath, weakness or lightheadedness, or any GI symptoms.  The patient states that he received chemotherapy last week, and had a blood transfusion several days ago.  Past Medical History:  Diagnosis Date  . Diabetes mellitus without complication ([place])   . Hairy cell leukemia ([place]) 06/03/2018  . Hypertension     Patient Active Problem List   Diagnosis Date Noted  . Hairy cell leukemia ([place]) 06/03/2018  . Goals of care, counseling/discussion 06/03/2018  . Neutropenia ([place]) 06/03/2018  . Symptomatic anemia 06/03/2018  . Pneumoperitoneum 07/24/2014    Past Surgical History:  Procedure Laterality Date  . COLON SURGERY      Prior to Admission medications   Medication Sig Start Date End Date Taking? Authorizing Provider  buPROPion (WELLBUTRIN XL) 150 MG 24 hr tablet Take 150 mg by mouth daily.    [provider]  ciprofloxacin (CIPRO) 500 MG tablet Take 1 tablet (500 mg total) by mouth daily. 06/09/18   [name], MD  lisinopril (PRINIVIL,ZESTRIL) 5 MG tablet Take 5 mg by mouth daily. 04/29/18   [provider]  nystatin (MYCOSTATIN) 100000 UNIT/ML suspension Take 5 mLs (500,000 Units total) by mouth 4 (four) times daily. 05/20/18   [name], MD  ondansetron (ZOFRAN) 8 MG tablet Take 1 tablet (8 mg total) by mouth 2 (two) times daily as needed (Nausea or vomiting).  06/03/18   [name], MD  Vitamin D, Ergocalciferol, (DRISDOL) 1.25 MG (50000 UT) CAPS capsule Take 50,000 Units by mouth once a week. 05/01/18   [provider]    Allergies Patient has no known allergies.  Family History  Problem Relation Age of Onset  . Thyroid disease Mother   . Alzheimer's disease Mother     Social History Social History   Tobacco Use  . Smoking status: Current Every Day Smoker    Packs/day: 1.00    Years: 30.00    Pack years: 30.00  . Smokeless tobacco: Never Used  Substance Use Topics  . Alcohol use: Never    Frequency: Never  . Drug use: Yes    Types: Marijuana    Comment: smoked marijuana in college    Review of Systems  Constitutional: Positive for resolved fever. Eyes: No redness. ENT: No sore throat. Cardiovascular: Denies chest pain. Respiratory: Denies shortness of breath. Gastrointestinal: No vomiting or diarrhea diarrhea.  Genitourinary: Negative for dysuria.  Musculoskeletal: Negative for back pain or myalgias. Skin: Negative for rash. Neurological: Negative for headache.   ____________________________________________   PHYSICAL EXAM:  VITAL SIGNS: ED Triage Vitals  Enc Vitals Group     BP 06/21/18 2104 128/73     Pulse Rate 06/21/18 2104 100     Resp 06/21/18 2104 16     Temp 06/21/18 2104 98.6 F (37 C)     Temp Source 06/21/18 2104 Oral     SpO2 06/21/18 2104  99 %     Weight 06/21/18 2105 187 lb 13.3 oz (85.2 kg)     Height 06/21/18 2105 5\' 9"  (1.753 m)     Head Circumference --      Peak Flow --      Pain Score 06/21/18 2105 0     Pain Loc --      Pain Edu? --      Excl. in [place]? --     Constitutional: Alert and oriented. Well appearing and in no acute distress. Eyes: Conjunctivae are normal.  Head: Atraumatic. Nose: No congestion/rhinnorhea. Mouth/Throat: Mucous membranes are moist.   Neck: Normal range of motion.  Cardiovascular: Good peripheral circulation. Respiratory: Normal respiratory effort.    Gastrointestinal: No distention.  Musculoskeletal: Extremities warm and well perfused.  Neurologic:  Normal speech and language. No gross focal neurologic deficits are appreciated.  Skin:  Skin is warm and dry. No rash noted. Psychiatric: Mood and affect are normal. Speech and behavior are normal.  ____________________________________________   LABS (all labs ordered are listed, but only abnormal results are displayed)  Labs Reviewed  BASIC METABOLIC PANEL - Abnormal; Notable for the following components:      Result Value   Sodium 130 (*)    Chloride 97 (*)    CO2 21 (*)    Glucose, Bld 123 (*)    Calcium 8.8 (*)    All other components within normal limits  CBC WITH DIFFERENTIAL/PLATELET - Abnormal; Notable for the following components:   WBC 0.4 (*)    RBC 2.27 (*)    Hemoglobin 7.1 (*)    HCT 20.7 (*)    RDW 19.6 (*)    Platelets 33 (*)    Neutro Abs 0.1 (*)    Lymphs Abs 0.4 (*)    Monocytes Absolute 0.0 (*)    All other components within normal limits  PATHOLOGIST SMEAR REVIEW   ____________________________________________  EKG   ____________________________________________  RADIOLOGY  CXR: No focal infiltrate or other acute abnormality  ____________________________________________   PROCEDURES  Procedure(s) performed: No  Procedures  Critical Care performed: No ____________________________________________   INITIAL IMPRESSION / ASSESSMENT AND PLAN / ED COURSE  Pertinent labs & imaging results that were available during my care of the patient were reviewed by me and considered in my medical decision making (see chart for details).  48 year old male with a history of hairy cell leukemia and other PMH as noted below status post chemotherapy last week presents with a fever this afternoon which has resolved.  He also had some mild cough but denies other acute symptoms.  He has no sick contacts or any history of exposure to anyone at high risk for  COVID-19.  He is currently asymptomatic.  On exam he is well-appearing and his vital signs are normal.  The remainder of the exam is unremarkable.  I reviewed the past medical records in Epic and confirmed the history of hairy cell leukemia and the chemotherapy treatment in the last week of March.  Overall suspect most likely symptoms related to chemotherapy, or a mild viral infection.  Differential also includes early pneumonia.  At this time I have no evidence to specifically suggest COVID-19, and the patient does not meet current testing criteria.  We will obtain chest x-ray and basic labs and reassess.  ----------------------------------------- 10:57 PM on 06/21/2018 -----------------------------------------  Chest x-ray shows no acute findings.  The patient continues to remain asymptomatic.  His labs are consistent with his baseline over the last  week.  His hemoglobin is improved from yesterday, and the WBC and neutrophil count are the same.  At this time, the patient is stable for discharge home.  I offered to discuss his work-up with the medical oncologist on call Dr. Grayland Ormond, but the patient states that he does not want to wait for this and would prefer to go home.  I counseled him on the results of the work-up.  I gave him very thorough return precautions and he expressed understanding.  Although he has no symptoms to suggest [id number], he is certainly more susceptible given his immune status and I counseled him specifically on strict home isolation guidelines.  _______  Roy Valentine was evaluated in Emergency Department on 06/21/2018 for the symptoms described in the history of present illness. He was evaluated in the context of the global COVID-19 pandemic, which necessitated consideration that the patient might be at risk for infection with the SARS-CoV-2 virus that causes COVID-19. Institutional protocols and algorithms that pertain to the evaluation of patients at risk for  COVID-19 are in a state of rapid change based on information released by regulatory bodies including the CDC and federal and state organizations. These policies and algorithms were followed during the patient's care in the ED.  ____________________________________________   FINAL CLINICAL IMPRESSION(S) / ED DIAGNOSES  Final diagnoses:  Fever, unspecified fever cause      NEW MEDICATIONS STARTED DURING THIS VISIT:  New Prescriptions   No medications on file     Note:  This document was prepared using Dragon voice recognition software and may include unintentional dictation errors.    Arta Silence, MD 06/21/18 2259

## 2018-06-21 NOTE — ED Notes (Signed)
Pt updated on delay in blood work. Pt in NAD and resting in bed at this time.

## 2018-06-21 NOTE — ED Triage Notes (Signed)
Pt has had a fever x 1 (101) today and took tylenol about 7:30pm. He is undergoing chemo for leukemia. He receives blood transfusions and had 2 units yesterday. In the past he hasn't had problems. He called office and they advised he comes to the ED for evaluation. Temp at present 98.6

## 2018-06-23 ENCOUNTER — Inpatient Hospital Stay: Payer: BLUE CROSS/BLUE SHIELD

## 2018-06-23 ENCOUNTER — Telehealth: Payer: Self-pay | Admitting: *Deleted

## 2018-06-23 LAB — PATHOLOGIST SMEAR REVIEW

## 2018-06-23 NOTE — Telephone Encounter (Signed)
Patient was seen in ED over the weekend with fever.  Called pt to follow up with him to see if he was still running fever, as we were planning to have him come in for labs, see Dr Tasia Catchings and poss blood transfusion tomorrow.  Patient states that he had a fever of 101.0 last night around 8pm.  Advised pt that he will not be able to be seen here with a fever.  I will plan to call patient tomorrow for follow up.

## 2018-06-23 NOTE — Telephone Encounter (Signed)
Talked to patient and advise patient to go to ER for neutropenic fever management. He is reluctant to go as he does not have fever now. He went to ED during the weekend after called oncall physician.  In ED, he was afebrile and wants to go home.  I advise patient to measure his temperature 2-3 times a day. If spikes fever again, he should go to ER immediately. I discussed the necessity of early evaluation and consequence of delaying treatments due to his immunocompromised state.  He voices understanding.

## 2018-06-25 ENCOUNTER — Other Ambulatory Visit: Payer: Self-pay

## 2018-06-26 ENCOUNTER — Other Ambulatory Visit: Payer: Self-pay | Admitting: *Deleted

## 2018-06-26 ENCOUNTER — Inpatient Hospital Stay: Payer: BLUE CROSS/BLUE SHIELD

## 2018-06-26 ENCOUNTER — Other Ambulatory Visit: Payer: Self-pay

## 2018-06-26 ENCOUNTER — Encounter: Payer: Self-pay | Admitting: Oncology

## 2018-06-26 ENCOUNTER — Inpatient Hospital Stay (HOSPITAL_BASED_OUTPATIENT_CLINIC_OR_DEPARTMENT_OTHER): Payer: BLUE CROSS/BLUE SHIELD | Admitting: Oncology

## 2018-06-26 ENCOUNTER — Other Ambulatory Visit: Payer: Self-pay | Admitting: Oncology

## 2018-06-26 VITALS — BP 105/70 | HR 118 | Temp 98.7°F | Wt 174.5 lb

## 2018-06-26 DIAGNOSIS — C9140 Hairy cell leukemia not having achieved remission: Secondary | ICD-10-CM

## 2018-06-26 DIAGNOSIS — D649 Anemia, unspecified: Secondary | ICD-10-CM

## 2018-06-26 DIAGNOSIS — E871 Hypo-osmolality and hyponatremia: Secondary | ICD-10-CM | POA: Diagnosis not present

## 2018-06-26 DIAGNOSIS — D696 Thrombocytopenia, unspecified: Secondary | ICD-10-CM

## 2018-06-26 DIAGNOSIS — B370 Candidal stomatitis: Secondary | ICD-10-CM

## 2018-06-26 DIAGNOSIS — R000 Tachycardia, unspecified: Secondary | ICD-10-CM

## 2018-06-26 DIAGNOSIS — D709 Neutropenia, unspecified: Secondary | ICD-10-CM

## 2018-06-26 DIAGNOSIS — D72810 Lymphocytopenia: Secondary | ICD-10-CM

## 2018-06-26 LAB — CBC WITH DIFFERENTIAL/PLATELET
Abs Immature Granulocytes: 0.01 10*3/uL (ref 0.00–0.07)
Basophils Absolute: 0 10*3/uL (ref 0.0–0.1)
Basophils Relative: 3 %
Eosinophils Absolute: 0 10*3/uL (ref 0.0–0.5)
Eosinophils Relative: 3 %
HCT: 18.9 % — ABNORMAL LOW (ref 39.0–52.0)
Hemoglobin: 6.3 g/dL — ABNORMAL LOW (ref 13.0–17.0)
Immature Granulocytes: 3 %
Lymphocytes Relative: 78 %
Lymphs Abs: 0.3 10*3/uL — ABNORMAL LOW (ref 0.7–4.0)
MCH: 31.2 pg (ref 26.0–34.0)
MCHC: 33.3 g/dL (ref 30.0–36.0)
MCV: 93.6 fL (ref 80.0–100.0)
Monocytes Absolute: 0 10*3/uL — ABNORMAL LOW (ref 0.1–1.0)
Monocytes Relative: 3 %
Neutro Abs: 0 10*3/uL — ABNORMAL LOW (ref 1.7–7.7)
Neutrophils Relative %: 10 %
Platelets: 54 10*3/uL — ABNORMAL LOW (ref 150–400)
RBC: 2.02 MIL/uL — ABNORMAL LOW (ref 4.22–5.81)
RDW: 18.8 % — ABNORMAL HIGH (ref 11.5–15.5)
Smear Review: NORMAL
WBC Morphology: ABNORMAL
WBC: 0.3 10*3/uL — CL (ref 4.0–10.5)
nRBC: 0 % (ref 0.0–0.2)

## 2018-06-26 LAB — COMPREHENSIVE METABOLIC PANEL
ALT: 24 U/L (ref 0–44)
AST: 22 U/L (ref 15–41)
Albumin: 3.6 g/dL (ref 3.5–5.0)
Alkaline Phosphatase: 51 U/L (ref 38–126)
Anion gap: 9 (ref 5–15)
BUN: 21 mg/dL — ABNORMAL HIGH (ref 6–20)
CO2: 23 mmol/L (ref 22–32)
Calcium: 8.7 mg/dL — ABNORMAL LOW (ref 8.9–10.3)
Chloride: 97 mmol/L — ABNORMAL LOW (ref 98–111)
Creatinine, Ser: 0.96 mg/dL (ref 0.61–1.24)
GFR calc Af Amer: >60 mL/min (ref >60)
GFR calc non Af Amer: >60 mL/min (ref >60)
Glucose, Bld: 124 mg/dL — ABNORMAL HIGH (ref 70–99)
Potassium: 3.9 mmol/L (ref 3.5–5.1)
Sodium: 129 mmol/L — ABNORMAL LOW (ref 135–145)
Total Bilirubin: 0.9 mg/dL (ref 0.3–1.2)
Total Protein: 8.3 g/dL — ABNORMAL HIGH (ref 6.5–8.1)

## 2018-06-26 LAB — SAMPLE TO BLOOD BANK

## 2018-06-26 LAB — PREPARE RBC (CROSSMATCH)

## 2018-06-26 MED ORDER — DIPHENHYDRAMINE HCL 25 MG PO CAPS
25.0000 mg | ORAL_CAPSULE | Freq: Once | ORAL | Status: AC
  Administered 2018-06-26: 25 mg via ORAL
  Filled 2018-06-26: qty 1

## 2018-06-26 MED ORDER — ACETAMINOPHEN 325 MG PO TABS
650.0000 mg | ORAL_TABLET | Freq: Once | ORAL | Status: AC
  Administered 2018-06-26: 650 mg via ORAL
  Filled 2018-06-26: qty 2

## 2018-06-26 MED ORDER — SODIUM CHLORIDE 0.9% IV SOLUTION
250.0000 mL | Freq: Once | INTRAVENOUS | Status: AC
  Administered 2018-06-26: 250 mL via INTRAVENOUS
  Filled 2018-06-26: qty 250

## 2018-06-26 NOTE — Progress Notes (Signed)
Patient here today for follow up.   

## 2018-06-27 LAB — BPAM RBC
Blood Product Expiration Date: 202004202359
ISSUE DATE / TIME: 202004091052
Unit Type and Rh: 5100

## 2018-06-27 LAB — TYPE AND SCREEN
ABO/RH(D): O POS
Antibody Screen: NEGATIVE
Unit division: 0

## 2018-06-27 MED ORDER — SULFAMETHOXAZOLE-TRIMETHOPRIM 800-160 MG PO TABS: 1.0000 | ORAL_TABLET | ORAL | 1 refills | Status: DC

## 2018-06-27 NOTE — Progress Notes (Signed)
Hematology/Oncology Consult note [facility] Telephone:([phone] [phone]   Patient Care Team: Perrin Maltese, MD as PCP - General (Internal Medicine)  REFERRING PROVIDER: Dr.Khan. REASON FOR VISIT:  Follow up for management of pancytopenia.   HISTORY OF PRESENTING ILLNESS:  Roy Valentine is a  48 y.o.  male with PMH listed below who was referred to me for evaluation of pancytopenia.  Patient recently follows up with pcp and had lab work up done.  Cbc showed hemoglobin of 7, leukopenia with ANC 0.1, thrombocytopenia with platelet count   Fatigue: reports worsening fatigue. Chronic onset, perisistent, for the past few months. no aggravating or improving factors, no associated symptoms.  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    He has ED visit on 08/12/2017 due to cough and fever. CXR showed pneumonia.  Cbc on 08/12/2017 showed wbc 1.7, hemoglobin 16, platelet 247,000.  No differential was checked at that time. Patient was given a course of antibiotics and per patient his symptoms improved.   Denies hematochezia, hematuria, hematemesis, epistaxis, black tarry stool or easy bruising.  Denies fever or chills, any recent frequent infections.  He has lost about 10 pounds since May 2019. Appetite is fair.   # Bone marrow biopsy was sent to [facility] for second opinion.  Comments as below.  A. Peripheral blood and bone marrow biopsy; Outside consult, [id number], [facility], [place], [place]. Date of procedure 05-12-18: Small mature B-cell neoplasm. 70% leukemic infiltrate in a 75% cellular bone marrow. Markedly decreased hematopoiesis with focal moderate reticulin fibrosis. Moderate to marked pancytopenia with monocytopenia. See comment.  Comment: The neoplastic involvement in the bone marrow assumes interstitial infiltrate with aggregates of small mature lymphocytes containing moderate amount of cytoplasm. Abnormal  lymphocytes with hairy-like projects are occasionally seen on the aspirate smear, even though the aspirate is apparently hemodilute. Per the outside report, flow cytometric analysis demonstrates 3% lambda light chain restricted B-cell population that is positive for CD103 and CD25. 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 So far, the findings do not support the diagnosis of hairy cell leukemia variant; however, if hairy cell leukemia variant is considered as an alternative, molecular diagnostic test for BRAF [id number] mutation could be performed to rule out the possibility and confirm the diagnosis of hairy cell leukemia. This case was reviewed in our hematopathology QA conference, and a consensus was reached. We appreciate your sending this interesting case in consultation and entirely agree with your assessment and diagnosis of this case.   [facility] opinion was discussed with patient in detail.    INTERVAL HISTORY Roy Valentine is a 48 y.o. 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  During the interval, patient has contacted cancer center reporting had a fever of 101.  Patient was advised by on-call physician to go to emergency room.  Patient took Tylenol few hours ago.  Denies any shortness of breath, weakness, lightheadedness or GI symptoms.  He was not febrile in the emergency room.  Chest x-ray shows no acute findings.  Patient was discharged home. Patient was offered an follow-up appointment at the cancer center and the patient reported having fever again of 101.  Patient was advised to go to emergency room and patient declined.  I called patient and discussed with him in details about the necessity and importance of  early treatment if he develops neutropenic fever.  He voices understanding and he is willing to go to emergency room if he spikes fever again.  We cannot see patient in cancer center until he is 48 hours free of fever.  Today patient presents for follow-up.  He reports no fever since 06/22/2018.  Denies any cough, shortness of breath, sore throat, muscle aches, chest pain or abdominal pain, diarrhea. Patient takes Cipro 500 mg twice daily for prophylaxis. Chronic fatigue at baseline. Appetite is fair. He has lost about 10 pounds since his visit on 06/09/2018.   Review of Systems  Constitutional: Positive for fatigue and unexpected weight change. Negative for appetite change, chills and fever.  HENT:   Negative for hearing loss and voice change.   Eyes: Negative for eye problems and icterus.  Respiratory: Negative for chest tightness, cough and shortness of breath.   Cardiovascular: Negative for chest pain and leg swelling.  Gastrointestinal: Negative for abdominal distention and abdominal pain.  Endocrine: Negative for hot flashes.  Genitourinary: Negative for difficulty urinating, dysuria and frequency.   Musculoskeletal: Negative for arthralgias.  Skin: Negative for itching and rash.  Neurological: Negative for light-headedness and numbness.  Hematological: Negative for adenopathy. Does not bruise/bleed easily.  Psychiatric/Behavioral: Negative for confusion.    MEDICAL HISTORY:  Past Medical History:  Diagnosis Date  . Diabetes mellitus without complication ([place])   . 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 Hypertension     SURGICAL HISTORY: Past Surgical History:  Procedure Laterality Date  . COLON SURGERY      SOCIAL HISTORY: Social History   Socioeconomic History  . Marital status: Single    Spouse name: Not on file  . Number of children: Not on file  . Years of education: Not on file  . Highest education level: Not on file  Occupational History  . Not on file   Social Needs  . Financial resource strain: Not on file  . Food insecurity:    Worry: Not on file    Inability: Not on file  . Transportation needs:    Medical: Not on file    Non-medical: Not on file  Tobacco Use  . Smoking status: Current Every Day Smoker    Packs/day: 1.00    Years: 30.00    Pack years: 30.00  . Smokeless tobacco: Never Used  Substance and Sexual Activity  . Alcohol use: Never    Frequency: Never  . Drug use: Yes    Types: Marijuana    Comment: smoked marijuana in college  . Sexual activity: Not on file  Lifestyle  . Physical activity:    Days per week: Not on file    Minutes per session: Not on file  . Stress: Not on file  Relationships  . Social connections:    Talks on phone: Not on file    Gets together: Not on file    Attends religious service: Not on file    Active member of club or organization: Not on file    Attends meetings of clubs or organizations: Not on file    Relationship status: Not on file  . Intimate partner violence:    Fear of current or ex partner: Not on file    Emotionally abused: Not on file    Physically abused: Not on file    Forced sexual activity: Not on file  Other Topics Concern  .  Not on file  Social History Narrative  . Not on file    FAMILY HISTORY: Family History  Problem Relation Age of Onset  . Thyroid disease Mother   . Alzheimer's disease Mother     ALLERGIES:  has No Known Allergies.  MEDICATIONS:  Current Outpatient Medications  Medication Sig Dispense Refill  . buPROPion (WELLBUTRIN XL) 150 MG 24 hr tablet Take 150 mg by mouth daily.    . ciprofloxacin (CIPRO) 500 MG tablet Take 1 tablet (500 mg total) by mouth daily. 90 tablet 1  . lisinopril (PRINIVIL,ZESTRIL) 5 MG tablet Take 5 mg by mouth daily.    Marland Kitchen nystatin (MYCOSTATIN) 100000 UNIT/ML suspension Take 5 mLs (500,000 Units total) by mouth 4 (four) times daily. 473 mL 0  . ondansetron (ZOFRAN) 8 MG tablet Take 1 tablet (8 mg total) by mouth 2  (two) times daily as needed (Nausea or vomiting). 30 tablet 1  . Vitamin D, Ergocalciferol, (DRISDOL) 1.25 MG (50000 UT) CAPS capsule Take 50,000 Units by mouth once a week.     No current facility-administered medications for this visit.      PHYSICAL EXAMINATION: ECOG PERFORMANCE STATUS: 1 - Symptomatic but completely ambulatory Vitals:   06/26/18 0907  BP: 105/70  Pulse: (!) 118  Temp: 98.7 F (37.1 C)   Filed Weights   06/26/18 0907  Weight: 174 lb 8 oz (79.2 kg)    Physical Exam Constitutional:      General: He is not in acute distress. HENT:     Head: Normocephalic and atraumatic.     Mouth/Throat:     Comments: Thrush  Eyes:     General: No scleral icterus.    Pupils: Pupils are equal, round, and reactive to light.  Neck:     Musculoskeletal: Normal range of motion and neck supple.  Cardiovascular:     Rate and Rhythm: Regular rhythm.     Heart sounds: Normal heart sounds.     Comments: Tachycardia Pulmonary:     Effort: Pulmonary effort is normal. No respiratory distress.     Breath sounds: No wheezing.  Abdominal:     General: Bowel sounds are normal. There is no distension.     Palpations: Abdomen is soft. There is no mass.     Tenderness: There is no abdominal tenderness.  Musculoskeletal: Normal range of motion.        General: No deformity.  Skin:    General: Skin is warm and dry.     Coloration: Skin is pale.     Findings: No erythema or rash.  Neurological:     Mental Status: He is alert and oriented to person, place, and time.     Cranial Nerves: No cranial nerve deficit.     Coordination: Coordination normal.  Psychiatric:        Behavior: Behavior normal.        Thought Content: Thought content normal.     RADIOGRAPHIC STUDIES: I have personally reviewed the radiological images as listed and agreed with the findings in the report.  CMP Latest Ref Rng & Units 06/26/2018  Glucose 70 - 99 mg/dL 124(H)  BUN 6 - 20 mg/dL 21(H)  Creatinine  0.61 - 1.24 mg/dL 0.96  Sodium 135 - 145 mmol/L 129(L)  Potassium 3.5 - 5.1 mmol/L 3.9  Chloride 98 - 111 mmol/L 97(L)  CO2 22 - 32 mmol/L 23  Calcium 8.9 - 10.3 mg/dL 8.7(L)  Total Protein 6.5 - 8.1 g/dL 8.3(H)  Total Bilirubin  0.3 - 1.2 mg/dL 0.9  Alkaline Phos 38 - 126 U/L 51  AST 15 - 41 U/L 22  ALT 0 - 44 U/L 24   CBC Latest Ref Rng & Units 06/26/2018  WBC 4.0 - 10.5 K/uL 0.3(LL)  Hemoglobin 13.0 - 17.0 g/dL 6.3(L)  Hematocrit 39.0 - 52.0 % 18.9(L)  Platelets 150 - 400 K/uL 54(L)    LABORATORY DATA:  I have reviewed the data as listed Lab Results  Component Value Date   WBC 0.3 (LL) 06/26/2018   HGB 6.3 (L) 06/26/2018   HCT 18.9 (L) 06/26/2018   MCV 93.6 06/26/2018   PLT 54 (L) 06/26/2018   Recent Labs    06/16/18 0940 06/20/18 0842 06/21/18 2131 06/26/18 0843  NA 134* 132* 130* 129*  K 4.3 4.3 3.8 3.9  CL 101 100 97* 97*  CO2 24 24 21* 23  GLUCOSE 150* 194* 123* 124*  BUN 18 21* 19 21*  CREATININE 1.04 1.23 1.05 0.96  CALCIUM 8.9 8.9 8.8* 8.7*  GFRNONAA >60 >60 >60 >60  GFRAA >60 >60 >60 >60  PROT 7.8 8.3*  --  8.3*  ALBUMIN 3.9 4.2  --  3.6  AST 15 18  --  22  ALT 14 16  --  24  ALKPHOS 41 45  --  51  BILITOT 0.9 1.2  --  0.9   Iron/TIBC/Ferritin/ %Sat    Component Value Date/Time   IRON 158 05/07/2018 1558   TIBC 403 05/07/2018 1558   FERRITIN 634 (H) 05/07/2018 1558   IRONPCTSAT 39 05/07/2018 1558     05/07/2018 peripheral blood flow cytometry showed CD103+, CD11c+, CD 25+, clonal B-cell population detected, representing 3% of the leukocytes, phenotype consistent with hairy cell leukemia.  05/12/2018 bone marrow biopsy Diagnosis Bone Marrow, Aspirate,Biopsy, and Clot, right iliac - HYPERCELLULAR BONE MARROW FOR AGE WITH INVOLVEMENT BY A B-CELL LYMPHOPROLIFERATIVE DISORDER. - SEE COMMENT. PERIPHERAL BLOOD: - PANCYTOPENIA. Diagnosis Note The overall material is suboptimal with lack of aspirate. Limited touch imprints and a core biopsy show  extensive involvement by a B-cell lymphoproliferative process of primarily small to medium sized lymphocytes with a predominant interstitial pattern of involvement. Flow cytometric analysis was attempted but much of the material likely represents a peripheralized sample and is not considered entirely contributory. Immunohistochemical stains show that B-cells weakly express cyclin D1 but lack CD5, CD10, CD34, CD138, or TdT expression. 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 A repeat biopsy with fresh core biopsy material for flow cytometric study is strongly recommended for definite and accurate sub-classification of this process. (BNS:ecj 05/14/2018)   ASSESSMENT & PLAN:  1. Symptomatic anemia   2. Hairy cell leukemia not having achieved remission ([place])   3. Neutropenia, unspecified type ([place])   4. Lymphocytopenia   5. Thrombocytopenia ([place])   6. Thrush   7. 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  He will need a repeat bone marrow biopsy in the future for assessment of treatment response.  I prefer to do it after I start to see hematological response. Neutropenia, on prophylactic antibiotics with Cipro 500 mg twice daily. Afebrile today. We discussed again about importance of early empiric antibiotics treatment for neutropenic fever to minimize chance of severe consequence such as septic shock ICU admission even death.  He voices understanding.  He will call if he spikes any fever right away.  And I advised patient not to  take any Tylenol if he spikes fever as Tylenol may mask his symptoms.  Symptomatic anemia, hemoglobin 6.3 likely secondary to combination of hairy cell leukemia and recent chemotherapy. We will proceed with 1 unit of irradiated PRBC transfusion.  Lymphocytopenia, which is new problem since chemotherapy.  Likely secondary to chemotherapy cladribine. Recommend  patient to start prophylactic Bactrim DS 1 tablet 3 times weekly for PCP prophylaxis.  Oral thrush, patient has nystatin and he is only using it as needed. Advised patient to start using nystatin oral rinse 4 times daily.  Thrombocytopenia, stable.  Monitor. Recommend weekly CBC, hold tubes, possible blood transfusion    Hyponatremia, sodium 129.  Likely due to poor oral intake.  Advised patient to stay well-hydrated.  Small snacks throughout the day.  Repeat chemistry in 1 week.  Tachycardia with heart rate of 118.  Suspect this is due to anemia.  He is going to get blood transfusion today. His PCP has suggested him to go on beta-blocker.  Advised patient to monitor his heart rate at home while on beta-blocker.  Patient has questions about general course of hairy cell leukemia, treatment side effect and what to anticipate in the future.  We spent sufficient time to discuss many aspect of care, questions were answered to patient's satisfaction.   [name], MD, PhD Hematology Oncology [facility] at [facility] Pager- [phone] 06/27/2018

## 2018-06-30 ENCOUNTER — Inpatient Hospital Stay: Payer: BLUE CROSS/BLUE SHIELD

## 2018-06-30 ENCOUNTER — Inpatient Hospital Stay: Payer: BLUE CROSS/BLUE SHIELD | Admitting: Oncology

## 2018-07-02 ENCOUNTER — Other Ambulatory Visit: Payer: Self-pay

## 2018-07-02 ENCOUNTER — Inpatient Hospital Stay: Payer: BLUE CROSS/BLUE SHIELD

## 2018-07-02 ENCOUNTER — Other Ambulatory Visit: Payer: Self-pay | Admitting: Oncology

## 2018-07-02 DIAGNOSIS — C9140 Hairy cell leukemia not having achieved remission: Secondary | ICD-10-CM | POA: Diagnosis not present

## 2018-07-02 DIAGNOSIS — D649 Anemia, unspecified: Secondary | ICD-10-CM

## 2018-07-02 LAB — CBC WITH DIFFERENTIAL/PLATELET
Abs Immature Granulocytes: 0.02 10*3/uL (ref 0.00–0.07)
Basophils Absolute: 0 10*3/uL (ref 0.0–0.1)
Basophils Relative: 0 %
Eosinophils Absolute: 0 10*3/uL (ref 0.0–0.5)
Eosinophils Relative: 4 %
HCT: 18.8 % — ABNORMAL LOW (ref 39.0–52.0)
Hemoglobin: 6.5 g/dL — ABNORMAL LOW (ref 13.0–17.0)
Immature Granulocytes: 4 %
Lymphocytes Relative: 79 %
Lymphs Abs: 0.4 10*3/uL — ABNORMAL LOW (ref 0.7–4.0)
MCH: 31.6 pg (ref 26.0–34.0)
MCHC: 34.6 g/dL (ref 30.0–36.0)
MCV: 91.3 fL (ref 80.0–100.0)
Monocytes Absolute: 0 10*3/uL — ABNORMAL LOW (ref 0.1–1.0)
Monocytes Relative: 2 %
Neutro Abs: 0.1 10*3/uL — ABNORMAL LOW (ref 1.7–7.7)
Neutrophils Relative %: 11 %
Platelets: 45 10*3/uL — ABNORMAL LOW (ref 150–400)
RBC: 2.06 MIL/uL — ABNORMAL LOW (ref 4.22–5.81)
RDW: 17 % — ABNORMAL HIGH (ref 11.5–15.5)
Smear Review: NORMAL
WBC Morphology: ABNORMAL
WBC: 0.5 10*3/uL — CL (ref 4.0–10.5)
nRBC: 0 % (ref 0.0–0.2)

## 2018-07-02 LAB — COMPREHENSIVE METABOLIC PANEL
ALT: 48 U/L — ABNORMAL HIGH (ref 0–44)
AST: 27 U/L (ref 15–41)
Albumin: 3.3 g/dL — ABNORMAL LOW (ref 3.5–5.0)
Alkaline Phosphatase: 61 U/L (ref 38–126)
Anion gap: 8 (ref 5–15)
BUN: 23 mg/dL — ABNORMAL HIGH (ref 6–20)
CO2: 24 mmol/L (ref 22–32)
Calcium: 8.9 mg/dL (ref 8.9–10.3)
Chloride: 97 mmol/L — ABNORMAL LOW (ref 98–111)
Creatinine, Ser: 1.13 mg/dL (ref 0.61–1.24)
GFR calc Af Amer: >60 mL/min (ref >60)
GFR calc non Af Amer: >60 mL/min (ref >60)
Glucose, Bld: 146 mg/dL — ABNORMAL HIGH (ref 70–99)
Potassium: 4.5 mmol/L (ref 3.5–5.1)
Sodium: 129 mmol/L — ABNORMAL LOW (ref 135–145)
Total Bilirubin: 0.8 mg/dL (ref 0.3–1.2)
Total Protein: 8.1 g/dL (ref 6.5–8.1)

## 2018-07-02 LAB — PREPARE RBC (CROSSMATCH)

## 2018-07-02 LAB — SAMPLE TO BLOOD BANK

## 2018-07-02 MED ORDER — ACETAMINOPHEN 325 MG PO TABS
650.0000 mg | ORAL_TABLET | Freq: Once | ORAL | Status: AC
  Administered 2018-07-02: 12:00:00 650 mg via ORAL
  Filled 2018-07-02: qty 2

## 2018-07-02 MED ORDER — SODIUM CHLORIDE 0.9% IV SOLUTION
250.0000 mL | Freq: Once | INTRAVENOUS | Status: AC
  Administered 2018-07-02: 250 mL via INTRAVENOUS
  Filled 2018-07-02: qty 250

## 2018-07-02 MED ORDER — DIPHENHYDRAMINE HCL 25 MG PO CAPS
25.0000 mg | ORAL_CAPSULE | Freq: Once | ORAL | Status: AC
  Administered 2018-07-02: 25 mg via ORAL
  Filled 2018-07-02: qty 1

## 2018-07-03 LAB — TYPE AND SCREEN
ABO/RH(D): O POS
Antibody Screen: NEGATIVE
Unit division: 0

## 2018-07-03 LAB — BPAM RBC
Blood Product Expiration Date: 202004222359
ISSUE DATE / TIME: 202004151204
Unit Type and Rh: 5100

## 2018-07-08 ENCOUNTER — Other Ambulatory Visit: Payer: Self-pay

## 2018-07-09 ENCOUNTER — Encounter: Payer: Self-pay | Admitting: Oncology

## 2018-07-09 ENCOUNTER — Inpatient Hospital Stay: Payer: BLUE CROSS/BLUE SHIELD

## 2018-07-09 ENCOUNTER — Other Ambulatory Visit: Payer: Self-pay

## 2018-07-09 ENCOUNTER — Inpatient Hospital Stay (HOSPITAL_BASED_OUTPATIENT_CLINIC_OR_DEPARTMENT_OTHER): Payer: BLUE CROSS/BLUE SHIELD | Admitting: Oncology

## 2018-07-09 VITALS — BP 116/77 | HR 126 | Temp 98.0°F | Resp 18 | Ht 69.0 in | Wt 162.7 lb

## 2018-07-09 DIAGNOSIS — D709 Neutropenia, unspecified: Secondary | ICD-10-CM

## 2018-07-09 DIAGNOSIS — R634 Abnormal weight loss: Secondary | ICD-10-CM | POA: Diagnosis not present

## 2018-07-09 DIAGNOSIS — C9140 Hairy cell leukemia not having achieved remission: Secondary | ICD-10-CM

## 2018-07-09 DIAGNOSIS — R112 Nausea with vomiting, unspecified: Secondary | ICD-10-CM

## 2018-07-09 DIAGNOSIS — D696 Thrombocytopenia, unspecified: Secondary | ICD-10-CM

## 2018-07-09 DIAGNOSIS — E871 Hypo-osmolality and hyponatremia: Secondary | ICD-10-CM | POA: Diagnosis not present

## 2018-07-09 DIAGNOSIS — D649 Anemia, unspecified: Secondary | ICD-10-CM

## 2018-07-09 DIAGNOSIS — R000 Tachycardia, unspecified: Secondary | ICD-10-CM

## 2018-07-09 DIAGNOSIS — D61818 Other pancytopenia: Secondary | ICD-10-CM | POA: Diagnosis not present

## 2018-07-09 DIAGNOSIS — B370 Candidal stomatitis: Secondary | ICD-10-CM

## 2018-07-09 LAB — CBC WITH DIFFERENTIAL/PLATELET
Abs Immature Granulocytes: 0 10*3/uL (ref 0.00–0.07)
Basophils Absolute: 0 10*3/uL (ref 0.0–0.1)
Basophils Relative: 1 %
Eosinophils Absolute: 0 10*3/uL (ref 0.0–0.5)
Eosinophils Relative: 1 %
HCT: 23.7 % — ABNORMAL LOW (ref 39.0–52.0)
Hemoglobin: 8 g/dL — ABNORMAL LOW (ref 13.0–17.0)
Immature Granulocytes: 0 %
Lymphocytes Relative: 65 %
Lymphs Abs: 0.5 10*3/uL — ABNORMAL LOW (ref 0.7–4.0)
MCH: 30.9 pg (ref 26.0–34.0)
MCHC: 33.8 g/dL (ref 30.0–36.0)
MCV: 91.5 fL (ref 80.0–100.0)
Monocytes Absolute: 0 10*3/uL — ABNORMAL LOW (ref 0.1–1.0)
Monocytes Relative: 1 %
Neutro Abs: 0.2 10*3/uL — ABNORMAL LOW (ref 1.7–7.7)
Neutrophils Relative %: 32 %
Platelets: 78 10*3/uL — ABNORMAL LOW (ref 150–400)
RBC: 2.59 MIL/uL — ABNORMAL LOW (ref 4.22–5.81)
RDW: 15.9 % — ABNORMAL HIGH (ref 11.5–15.5)
Smear Review: NORMAL
WBC: 0.7 10*3/uL — CL (ref 4.0–10.5)
nRBC: 0 % (ref 0.0–0.2)

## 2018-07-09 LAB — SAMPLE TO BLOOD BANK

## 2018-07-09 MED ORDER — PROCHLORPERAZINE MALEATE 10 MG PO TABS: 10.0000 mg | ORAL_TABLET | Freq: Four times a day (QID) | ORAL | 0 refills | Status: DC | PRN

## 2018-07-09 MED ORDER — FLUCONAZOLE 200 MG PO TABS: 400.0000 mg | ORAL_TABLET | Freq: Every day | ORAL | 0 refills | Status: DC

## 2018-07-09 NOTE — Progress Notes (Signed)
pt continues to loose wt. c/o decreased appetite, nausea. 2 bowel movements a week. only using colace for BM. discussed dietary consult with Joli- patient agreeable.

## 2018-07-09 NOTE — Progress Notes (Signed)
Hematology/Oncology Consult note [facility] Telephone:([phone] [phone]   Patient Care Team: Perrin Maltese, MD as PCP - General (Internal Medicine)  REFERRING PROVIDER: Dr.Khan. REASON FOR VISIT:  Follow up for management of pancytopenia, hairy cell leukemia  HISTORY OF PRESENTING ILLNESS:  Roy Valentine is a  48 y.o.  male with PMH listed below who was referred to me for evaluation of pancytopenia.  Patient recently follows up with pcp and had lab work up done.  Cbc showed hemoglobin of 7, leukopenia with ANC 0.1, thrombocytopenia with platelet count   Fatigue: reports worsening fatigue. Chronic onset, perisistent, for the past few months. no aggravating or improving factors, no associated symptoms.  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    He has ED visit on 08/12/2017 due to cough and fever. CXR showed pneumonia.  Cbc on 08/12/2017 showed wbc 1.7, hemoglobin 16, platelet 247,000.  No differential was checked at that time. Patient was given a course of antibiotics and per patient his symptoms improved.   Denies hematochezia, hematuria, hematemesis, epistaxis, black tarry stool or easy bruising.  Denies fever or chills, any recent frequent infections.  He has lost about 10 pounds since May 2019. Appetite is fair.   # Bone marrow biopsy was sent to [facility] for second opinion.  Comments as below.  A. Peripheral blood and bone marrow biopsy; Outside consult, [id number], [facility], [place], [place]. Date of procedure 05-12-18: Small mature B-cell neoplasm. 70% leukemic infiltrate in a 75% cellular bone marrow. Markedly decreased hematopoiesis with focal moderate reticulin fibrosis. Moderate to marked pancytopenia with monocytopenia. See comment.  Comment: The neoplastic involvement in the bone marrow assumes interstitial infiltrate with aggregates of small mature lymphocytes containing moderate amount of  cytoplasm. Abnormal lymphocytes with hairy-like projects are occasionally seen on the aspirate smear, even though the aspirate is apparently hemodilute. Per the outside report, flow cytometric analysis demonstrates 3% lambda light chain restricted B-cell population that is positive for CD103 and CD25. 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 So far, the findings do not support the diagnosis of hairy cell leukemia variant; however, if hairy cell leukemia variant is considered as an alternative, molecular diagnostic test for BRAF [id number] mutation could be performed to rule out the possibility and confirm the diagnosis of hairy cell leukemia. This case was reviewed in our hematopathology QA conference, and a consensus was reached. We appreciate your sending this interesting case in consultation and entirely agree with your assessment and diagnosis of this case.   [facility] pathology opinion was discussed with patient in detail.    INTERVAL HISTORY Roy Valentine is a 48 y.o. 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  Patient denies any fever, chills, cough, shortness of breath, sore throat, abdominal pain or diarrhea..  He does not have any appetite.  Feels nauseated after eating.  He takes Zofran as needed however Zofran causes headache. He uses nystatin oral rinse, white plaque on his tongue did not improve. He is taking Cipro 500 mg twice daily and Bactrim DS Monday Wednesday Friday. He has lost it 12 pounds since 2 weeks ago. Constipated, he takes Colace daily.  Has 2 bowel meds per week.  Review of Systems  Constitutional: Positive for fatigue  and unexpected weight change. Negative for appetite change, chills and fever.  HENT:    Negative for hearing loss and voice change.   Eyes: Negative for eye problems and icterus.  Respiratory: Negative for chest tightness, cough and shortness of breath.   Cardiovascular: Negative for chest pain and leg swelling.  Gastrointestinal: Positive for constipation and nausea. Negative for abdominal distention and abdominal pain.  Endocrine: Negative for hot flashes.  Genitourinary: Negative for difficulty urinating, dysuria and frequency.   Musculoskeletal: Negative for arthralgias.  Skin: Negative for itching and rash.  Neurological: Negative for light-headedness and numbness.  Hematological: Negative for adenopathy. Does not bruise/bleed easily.  Psychiatric/Behavioral: Negative for confusion.    MEDICAL HISTORY:  Past Medical History:  Diagnosis Date  . Diabetes mellitus without complication ([place])   . 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 Hypertension     SURGICAL HISTORY: Past Surgical History:  Procedure Laterality Date  . COLON SURGERY      SOCIAL HISTORY: Social History   Socioeconomic History  . Marital status: Single    Spouse name: Not on file  . Number of children: Not on file  . Years of education: Not on file  . Highest education level: Not on file  Occupational History  . Not on file  Social Needs  . Financial resource strain: Not on file  . Food insecurity:    Worry: Not on file    Inability: Not on file  . Transportation needs:    Medical: Not on file    Non-medical: Not on file  Tobacco Use  . Smoking status: Current Every Day Smoker    Packs/day: 1.00    Years: 30.00    Pack years: 30.00  . Smokeless tobacco: Never Used  Substance and Sexual Activity  . Alcohol use: Never    Frequency: Never  . Drug use: Yes    Types: Marijuana    Comment: smoked marijuana in college  . Sexual activity: Not on file  Lifestyle  . Physical activity:    Days per week: Not on file    Minutes per session: Not on file  . Stress: Not on file   Relationships  . Social connections:    Talks on phone: Not on file    Gets together: Not on file    Attends religious service: Not on file    Active member of club or organization: Not on file    Attends meetings of clubs or organizations: Not on file    Relationship status: Not on file  . Intimate partner violence:    Fear of current or ex partner: Not on file    Emotionally abused: Not on file    Physically abused: Not on file    Forced sexual activity: Not on file  Other Topics Concern  . Not on file  Social History Narrative  . Not on file    FAMILY HISTORY: Family History  Problem Relation Age of Onset  . Thyroid disease Mother   . Alzheimer's disease Mother     ALLERGIES:  has No Known Allergies.  MEDICATIONS:  Current Outpatient Medications  Medication Sig Dispense Refill  . buPROPion (WELLBUTRIN XL) 150 MG 24 hr tablet Take 150 mg by mouth daily.    . ciprofloxacin (CIPRO) 500 MG tablet Take 1 tablet (500 mg total) by mouth daily. 90 tablet 1  . Docusate Sodium (COLACE PO) Take 1 capsule by mouth daily.    Marland Kitchen lisinopril (PRINIVIL,ZESTRIL) 5 MG tablet Take  5 mg by mouth daily.    Marland Kitchen nystatin (MYCOSTATIN) 100000 UNIT/ML suspension Take 5 mLs (500,000 Units total) by mouth 4 (four) times daily. 473 mL 0  . Semaglutide (RYBELSUS) 7 MG TABS Take 7 mg by mouth daily.    Marland Kitchen sulfamethoxazole-trimethoprim (BACTRIM DS,SEPTRA DS) 800-160 MG tablet Take 1 tablet by mouth 3 (three) times a week. Take 1 tablet on Mondays, Wednesdays and Fridays 30 tablet 1  . Vitamin D, Ergocalciferol, (DRISDOL) 1.25 MG (50000 UT) CAPS capsule Take 50,000 Units by mouth once a week.    . fluconazole (DIFLUCAN) 200 MG tablet Take 2 tablets (400 mg total) by mouth daily. 60 tablet 0  . metoprolol succinate (TOPROL-XL) 25 MG 24 hr tablet Take 1 tablet by mouth daily.    . prochlorperazine (COMPAZINE) 10 MG tablet Take 1 tablet (10 mg total) by mouth every 6 (six) hours as needed for nausea or vomiting.  30 tablet 0   No current facility-administered medications for this visit.      PHYSICAL EXAMINATION: ECOG PERFORMANCE STATUS: 1 - Symptomatic but completely ambulatory Vitals:   07/09/18 0828  BP: 116/77  Pulse: (!) 126  Resp: 18  Temp: 98 F (36.7 C)  SpO2: 99%   Filed Weights   07/09/18 0828  Weight: 162 lb 11.2 oz (73.8 kg)    Physical Exam Constitutional:      General: He is not in acute distress. HENT:     Head: Normocephalic and atraumatic.     Mouth/Throat:     Comments: Thrush  Eyes:     General: No scleral icterus.    Pupils: Pupils are equal, round, and reactive to light.  Neck:     Musculoskeletal: Normal range of motion and neck supple.  Cardiovascular:     Rate and Rhythm: Normal rate and regular rhythm.     Heart sounds: Normal heart sounds.     Comments: Tachycardia Pulmonary:     Effort: Pulmonary effort is normal. No respiratory distress.     Breath sounds: No wheezing.  Abdominal:     General: Bowel sounds are normal. There is no distension.     Palpations: Abdomen is soft. There is no mass.     Tenderness: There is no abdominal tenderness.  Musculoskeletal: Normal range of motion.        General: No deformity.  Skin:    General: Skin is warm and dry.     Coloration: Skin is pale.     Findings: No erythema or rash.  Neurological:     Mental Status: He is alert and oriented to person, place, and time.     Cranial Nerves: No cranial nerve deficit.     Coordination: Coordination normal.  Psychiatric:        Behavior: Behavior normal.        Thought Content: Thought content normal.     RADIOGRAPHIC STUDIES: I have personally reviewed the radiological images as listed and agreed with the findings in the report.  CMP Latest Ref Rng & Units 07/02/2018  Glucose 70 - 99 mg/dL 146(H)  BUN 6 - 20 mg/dL 23(H)  Creatinine 0.61 - 1.24 mg/dL 1.13  Sodium 135 - 145 mmol/L 129(L)  Potassium 3.5 - 5.1 mmol/L 4.5  Chloride 98 - 111 mmol/L 97(L)   CO2 22 - 32 mmol/L 24  Calcium 8.9 - 10.3 mg/dL 8.9  Total Protein 6.5 - 8.1 g/dL 8.1  Total Bilirubin 0.3 - 1.2 mg/dL 0.8  Alkaline Phos 38 -  126 U/L 61  AST 15 - 41 U/L 27  ALT 0 - 44 U/L 48(H)   CBC Latest Ref Rng & Units 07/09/2018  WBC 4.0 - 10.5 K/uL 0.7(LL)  Hemoglobin 13.0 - 17.0 g/dL 8.0(L)  Hematocrit 39.0 - 52.0 % 23.7(L)  Platelets 150 - 400 K/uL 78(L)    LABORATORY DATA:  I have reviewed the data as listed Lab Results  Component Value Date   WBC 0.7 (LL) 07/09/2018   HGB 8.0 (L) 07/09/2018   HCT 23.7 (L) 07/09/2018   MCV 91.5 07/09/2018   PLT 78 (L) 07/09/2018   Recent Labs    06/20/18 0842 06/21/18 2131 06/26/18 0843 07/02/18 0836  NA 132* 130* 129* 129*  K 4.3 3.8 3.9 4.5  CL 100 97* 97* 97*  CO2 24 21* 23 24  GLUCOSE 194* 123* 124* 146*  BUN 21* 19 21* 23*  CREATININE 1.23 1.05 0.96 1.13  CALCIUM 8.9 8.8* 8.7* 8.9  GFRNONAA >60 >60 >60 >60  GFRAA >60 >60 >60 >60  PROT 8.3*  --  8.3* 8.1  ALBUMIN 4.2  --  3.6 3.3*  AST 18  --  22 27  ALT 16  --  24 48*  ALKPHOS 45  --  51 61  BILITOT 1.2  --  0.9 0.8   Iron/TIBC/Ferritin/ %Sat    Component Value Date/Time   IRON 158 05/07/2018 1558   TIBC 403 05/07/2018 1558   FERRITIN 634 (H) 05/07/2018 1558   IRONPCTSAT 39 05/07/2018 1558     05/07/2018 peripheral blood flow cytometry showed CD103+, CD11c+, CD 25+, clonal B-cell population detected, representing 3% of the leukocytes, phenotype consistent with hairy cell leukemia.  05/12/2018 bone marrow biopsy Diagnosis Bone Marrow, Aspirate,Biopsy, and Clot, right iliac - HYPERCELLULAR BONE MARROW FOR AGE WITH INVOLVEMENT BY A B-CELL LYMPHOPROLIFERATIVE DISORDER. - SEE COMMENT. PERIPHERAL BLOOD: - PANCYTOPENIA. Diagnosis Note The overall material is suboptimal with lack of aspirate. Limited touch imprints and a core biopsy show extensive involvement by a B-cell lymphoproliferative process of primarily small to medium sized lymphocytes with a  predominant interstitial pattern of involvement. Flow cytometric analysis was attempted but much of the material likely represents a peripheralized sample and is not considered entirely contributory. Immunohistochemical stains show that B-cells weakly express cyclin D1 but lack CD5, CD10, CD34, CD138, or TdT expression. 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 A repeat biopsy with fresh core biopsy material for flow cytometric study is strongly recommended for definite and accurate sub-classification of this process. (BNS:ecj 05/14/2018)   ASSESSMENT & PLAN:  1. Hairy cell leukemia not having achieved remission ([place])   2. Loss of weight   3. Thrombocytopenia ([place])   4. Neutropenia, unspecified type ([place])   5. Anemia, unspecified type   6. Thrush   7. 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  He will need a repeat bone marrow biopsy in the future for assessment of treatment response.  I prefer to do it after I start to see hematological response.  #Neutropenia, continue prophylactic antibiotics with Cipro 500 mg twice daily and Bactrim DS Monday Wednesday Friday.  #Thrush, I will add fluconazole 400 mg daily for treatment in future for prophylactic antibiotics. #Nausea, switch to Compazine 10 mg every 6 hours as needed.  Prescription sent to pharmacy. #Anemia, hemoglobin improved.  No blood transfusion today. #Thrombocytopenia, platelet count stable.  No platelet transfusion today.  #Tachycardia, likely due to anemia as well as poor oral intake.  Advised patient to increase oral hydration.  He has been started on Toprol XL by PCP. Today his HR was high in clinic, 126, asymptomatic, was measure after he walks in. Patient reports that he has been drinking enough fluid do not feel dehydrated.  Not interested in IV fluid hydration #Weight  loss, refer to dietitian.  I called him on 07/10/2018 for follow up and he count his pulse which was 93. He drank gatarade 0 yesterday and improved oral hydration.  He feels better as well. He knows to call me if questions or worsening of symptoms.   Repeat CBC CMP hold tube  in 1 week/ possible blood.  Follow up in clinic in 2 weeks, cbc cmp hold tube, -lab MD assessment and possible blood    [name], MD, PhD Hematology Oncology [facility] Pager- [phone] 07/09/2018

## 2018-07-10 ENCOUNTER — Encounter (HOSPITAL_COMMUNITY): Payer: Self-pay

## 2018-07-14 ENCOUNTER — Inpatient Hospital Stay: Payer: BLUE CROSS/BLUE SHIELD

## 2018-07-14 NOTE — Progress Notes (Signed)
Nutrition Assessment  RD working remotely.  Reason for Assessment:  Referral for weight loss and poor appetite   ASSESSMENT:  48 year old male with hairy cell leukemia followed by Dr. Tasia Catchings.  Past medical history of DM, HTN.  Patient receiving cladribine.    Called patient via phone for nutrition visit due to COVID-19 pandemic.  Patient reports that really had hard time with nausea and vomiting after chemotherapy lasting for 2 weeks.  Reports that appetite is better and has been eating better the last few days.  Reports only took compazine a few times. Reports bowels are moving more frequently as well. Reports that usually has been eating yogurt with fruit for breakfast.  Yesterday ate lasagna for lunch and sandwich for dinner last night.  Reports that appetite is coming back to normal.      Nutrition Focused Physical Exam: deferred    Medications: compazine, colace, Vit D   Labs: reviewed   Anthropometrics:   Height: 69 inches Weight: 162 lb 11.2 oz on 4/22 Noted 188 lb on 3/24.  Patient reports in high 180s for UBW BMI: 24  13% weight loss in the last month, significant   Estimated Energy Needs  Kcals: 2200-2600 calories Protein: 110-130 g Fluid: 2.6 L   NUTRITION DIAGNOSIS: Inadequate oral intake related to cancer related treatment side effects as evidenced by 13% weight loss in 1 month, nausea, poor po intake   INTERVENTION:  Discussed strategies to help with nausea. Handout emailed to patient Discussed ways to increase calories and protein to prevent further weight loss.  Handout emailed. Discussed oral nutrition supplements as option to increase calories and protein. Bag of sample shakes with coupons made and given to RN for patient to pick up on Wednesday, at next treatment Contact information given to patient   MONITORING, EVALUATION, GOAL: patient will consume adequate calories to maintain weight   Next Visit: patient declined further follow up at this  time.  Will reach out to RD if needed  Roy Valentine, [place], [place] Registered Dietitian [phone] (pager)

## 2018-07-15 ENCOUNTER — Other Ambulatory Visit: Payer: Self-pay

## 2018-07-16 ENCOUNTER — Other Ambulatory Visit: Payer: Self-pay

## 2018-07-16 ENCOUNTER — Inpatient Hospital Stay: Payer: BLUE CROSS/BLUE SHIELD

## 2018-07-16 DIAGNOSIS — D649 Anemia, unspecified: Secondary | ICD-10-CM | POA: Insufficient documentation

## 2018-07-16 DIAGNOSIS — C9140 Hairy cell leukemia not having achieved remission: Secondary | ICD-10-CM | POA: Insufficient documentation

## 2018-07-16 LAB — CBC WITH DIFFERENTIAL/PLATELET
Abs Immature Granulocytes: 0.01 10*3/uL (ref 0.00–0.07)
Basophils Absolute: 0 10*3/uL (ref 0.0–0.1)
Basophils Relative: 1 %
Eosinophils Absolute: 0 10*3/uL (ref 0.0–0.5)
Eosinophils Relative: 1 %
HCT: 24.3 % — ABNORMAL LOW (ref 39.0–52.0)
Hemoglobin: 7.9 g/dL — ABNORMAL LOW (ref 13.0–17.0)
Immature Granulocytes: 1 %
Lymphocytes Relative: 48 %
Lymphs Abs: 0.6 10*3/uL — ABNORMAL LOW (ref 0.7–4.0)
MCH: 31.1 pg (ref 26.0–34.0)
MCHC: 32.5 g/dL (ref 30.0–36.0)
MCV: 95.7 fL (ref 80.0–100.0)
Monocytes Absolute: 0 10*3/uL — ABNORMAL LOW (ref 0.1–1.0)
Monocytes Relative: 2 %
Neutro Abs: 0.6 10*3/uL — ABNORMAL LOW (ref 1.7–7.7)
Neutrophils Relative %: 47 %
Platelets: 134 10*3/uL — ABNORMAL LOW (ref 150–400)
RBC: 2.54 MIL/uL — ABNORMAL LOW (ref 4.22–5.81)
RDW: 18.1 % — ABNORMAL HIGH (ref 11.5–15.5)
WBC: 1.2 10*3/uL — CL (ref 4.0–10.5)
nRBC: 2.5 % — ABNORMAL HIGH (ref 0.0–0.2)

## 2018-07-16 LAB — COMPREHENSIVE METABOLIC PANEL
ALT: 26 U/L (ref 0–44)
AST: 24 U/L (ref 15–41)
Albumin: 3.6 g/dL (ref 3.5–5.0)
Alkaline Phosphatase: 51 U/L (ref 38–126)
Anion gap: 8 (ref 5–15)
BUN: 18 mg/dL (ref 6–20)
CO2: 26 mmol/L (ref 22–32)
Calcium: 9.4 mg/dL (ref 8.9–10.3)
Chloride: 100 mmol/L (ref 98–111)
Creatinine, Ser: 1.2 mg/dL (ref 0.61–1.24)
GFR calc Af Amer: >60 mL/min (ref >60)
GFR calc non Af Amer: >60 mL/min (ref >60)
Glucose, Bld: 124 mg/dL — ABNORMAL HIGH (ref 70–99)
Potassium: 4.4 mmol/L (ref 3.5–5.1)
Sodium: 134 mmol/L — ABNORMAL LOW (ref 135–145)
Total Bilirubin: 0.4 mg/dL (ref 0.3–1.2)
Total Protein: 8.3 g/dL — ABNORMAL HIGH (ref 6.5–8.1)

## 2018-07-22 ENCOUNTER — Other Ambulatory Visit: Payer: Self-pay

## 2018-07-22 ENCOUNTER — Inpatient Hospital Stay: Payer: BLUE CROSS/BLUE SHIELD | Attending: Oncology | Admitting: Oncology

## 2018-07-22 ENCOUNTER — Encounter: Payer: Self-pay | Admitting: Oncology

## 2018-07-22 DIAGNOSIS — D696 Thrombocytopenia, unspecified: Secondary | ICD-10-CM

## 2018-07-22 DIAGNOSIS — C9140 Hairy cell leukemia not having achieved remission: Secondary | ICD-10-CM | POA: Insufficient documentation

## 2018-07-22 DIAGNOSIS — D709 Neutropenia, unspecified: Secondary | ICD-10-CM

## 2018-07-22 DIAGNOSIS — I10 Essential (primary) hypertension: Secondary | ICD-10-CM | POA: Insufficient documentation

## 2018-07-22 DIAGNOSIS — D649 Anemia, unspecified: Secondary | ICD-10-CM | POA: Diagnosis not present

## 2018-07-22 DIAGNOSIS — B370 Candidal stomatitis: Secondary | ICD-10-CM

## 2018-07-22 DIAGNOSIS — Z79899 Other long term (current) drug therapy: Secondary | ICD-10-CM | POA: Insufficient documentation

## 2018-07-22 DIAGNOSIS — E119 Type 2 diabetes mellitus without complications: Secondary | ICD-10-CM | POA: Insufficient documentation

## 2018-07-22 DIAGNOSIS — D61818 Other pancytopenia: Secondary | ICD-10-CM | POA: Insufficient documentation

## 2018-07-22 DIAGNOSIS — F17210 Nicotine dependence, cigarettes, uncomplicated: Secondary | ICD-10-CM | POA: Insufficient documentation

## 2018-07-22 NOTE — Progress Notes (Signed)
Called patient for Telehealth visit via [employer].  Patient states no new concerns today.  

## 2018-07-23 ENCOUNTER — Inpatient Hospital Stay: Payer: BLUE CROSS/BLUE SHIELD

## 2018-07-23 ENCOUNTER — Inpatient Hospital Stay: Payer: BLUE CROSS/BLUE SHIELD | Admitting: Oncology

## 2018-07-24 NOTE — Progress Notes (Signed)
HEMATOLOGY-ONCOLOGY TeleHEALTH VISIT PROGRESS NOTE  I connected with [name] on 07/22/18 at  1:45 PM EDT by video enabled telemedicine visit and verified that I am speaking with the correct person using two identifiers. I discussed the limitations, risks, security and privacy concerns of performing an evaluation and management service by telemedicine and the availability of in-person appointments. I also discussed with the patient that there may be a patient responsible charge related to this service. The patient expressed understanding and agreed to proceed.   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 male who has above history reviewed by me today presents for follow up visit for management of management of hairy cell leukemia and pancytopenia Problems and complaints are listed below:  Patient reports feeling better.  Denies any fever, chills, nausea, vomiting, chest pain, abdominal pain.  Appetite is better. He takes prophylactic antibiotics.  Review of Systems  Constitutional: Negative for appetite change, chills, fever and unexpected weight change.  HENT:   Negative for hearing loss and voice change.   Eyes: Negative for eye problems and icterus.  Respiratory: Negative for chest tightness, cough and shortness of breath.   Cardiovascular: Negative for chest pain and leg swelling.  Gastrointestinal: Negative for abdominal distention and abdominal pain.  Endocrine: Negative for hot flashes.  Genitourinary: Negative for difficulty urinating, dysuria and frequency.   Musculoskeletal: Negative for arthralgias.  Skin: Negative for itching and rash.  Neurological: Negative for light-headedness and numbness.   Hematological: Negative for adenopathy. Does not bruise/bleed easily.  Psychiatric/Behavioral: Negative for confusion.    Past Medical History:  Diagnosis Date  . Diabetes mellitus without complication ([place])   . 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 Hypertension    Past Surgical History:  Procedure Laterality Date  . COLON SURGERY      Family History  Problem Relation Age of Onset  . Thyroid disease Mother   . Alzheimer's disease Mother     Social History   Socioeconomic History  . Marital status: Single    Spouse name: Not on file  . Number of children: Not on file  . Years of education: Not on file  . Highest education level: Not on file  Occupational History  . Not on file  Social Needs  . Financial resource strain: Not on file  . Food insecurity:    Worry: Not on file    Inability: Not on file  . Transportation needs:    Medical: Not on file    Non-medical: Not on file  Tobacco Use  . Smoking status: Current Every Day Smoker    Packs/day: 1.00    Years: 30.00    Pack years: 30.00  . Smokeless tobacco: Never Used  Substance and Sexual Activity  . Alcohol use: Never    Frequency: Never  . Drug use: Yes    Types: Marijuana    Comment: smoked marijuana in college  . Sexual activity: Not on file  Lifestyle  . Physical activity:    Days per week: Not on file    Minutes per session: Not on file  . Stress: Not on file  Relationships  . Social connections:    Talks on phone: Not on file    Gets together:  Not on file    Attends religious service: Not on file    Active member of club or organization: Not on file    Attends meetings of clubs or organizations: Not on file    Relationship status: Not on file  . Intimate partner violence:    Fear of current or ex partner: Not on file    Emotionally abused: Not on file    Physically abused: Not on file    Forced sexual activity: Not on file  Other Topics Concern  . Not on file  Social History Narrative  .  Not on file    Current Outpatient Medications on File Prior to Visit  Medication Sig Dispense Refill  . buPROPion (WELLBUTRIN XL) 150 MG 24 hr tablet Take 150 mg by mouth daily.    . ciprofloxacin (CIPRO) 500 MG tablet Take 1 tablet (500 mg total) by mouth daily. 90 tablet 1  . Docusate Sodium (COLACE PO) Take 1 capsule by mouth daily.    . fluconazole (DIFLUCAN) 200 MG tablet Take 2 tablets (400 mg total) by mouth daily. 60 tablet 0  . lisinopril (PRINIVIL,ZESTRIL) 5 MG tablet Take 5 mg by mouth daily.    . metoprolol succinate (TOPROL-XL) 25 MG 24 hr tablet Take 1 tablet by mouth daily.    . prochlorperazine (COMPAZINE) 10 MG tablet Take 1 tablet (10 mg total) by mouth every 6 (six) hours as needed for nausea or vomiting. 30 tablet 0  . Semaglutide (RYBELSUS) 7 MG TABS Take 7 mg by mouth daily.    Marland Kitchen sulfamethoxazole-trimethoprim (BACTRIM DS,SEPTRA DS) 800-160 MG tablet Take 1 tablet by mouth 3 (three) times a week. Take 1 tablet on Mondays, Wednesdays and Fridays 30 tablet 1  . Vitamin D, Ergocalciferol, (DRISDOL) 1.25 MG (50000 UT) CAPS capsule Take 50,000 Units by mouth once a week.    . nystatin (MYCOSTATIN) 100000 UNIT/ML suspension Take 5 mLs (500,000 Units total) by mouth 4 (four) times daily. (Patient not taking: Reported on 07/22/2018) 473 mL 0   No current facility-administered medications on file prior to visit.     No Known Allergies     Observations/Objective: Today's Vitals   07/22/18 1343  PainSc: 0-No pain   There is no height or weight on file to calculate BMI.  Physical Exam  Constitutional: He is oriented to person, place, and time. No distress.  HENT:  Head: Normocephalic and atraumatic.  Pulmonary/Chest: Effort normal.  Neurological: He is alert and oriented to person, place, and time.  Psychiatric: Affect normal.    CBC    Component Value Date/Time   WBC 1.2 (LL) 07/16/2018 0852   RBC 2.54 (L) 07/16/2018 0852   HGB 7.9 (L) 07/16/2018 0852   HGB 15.9  07/12/2014 0501   HCT 24.3 (L) 07/16/2018 0852   HCT 47.4 07/12/2014 0501   PLT 134 (L) 07/16/2018 0852   PLT 230 07/12/2014 0501   MCV 95.7 07/16/2018 0852   MCV 89 07/12/2014 0501   MCH 31.1 07/16/2018 0852   MCHC 32.5 07/16/2018 0852   RDW 18.1 (H) 07/16/2018 0852   RDW 15.0 (H) 07/12/2014 0501   LYMPHSABS 0.6 (L) 07/16/2018 0852   LYMPHSABS 1.7 07/12/2014 0501   MONOABS 0.0 (L) 07/16/2018 0852   MONOABS 1.7 (H) 07/12/2014 0501   EOSABS 0.0 07/16/2018 0852   EOSABS 0.4 07/12/2014 0501   BASOSABS 0.0 07/16/2018 0852   BASOSABS 0.1 07/12/2014 0501    CMP     Component Value Date/Time  NA 134 (L) 07/16/2018 0852   NA 134 (L) 07/16/2014 0523   K 4.4 07/16/2018 0852   K 4.2 07/16/2014 0523   CL 100 07/16/2018 0852   CL 97 (L) 07/16/2014 0523   CO2 26 07/16/2018 0852   CO2 29 07/16/2014 0523   GLUCOSE 124 (H) 07/16/2018 0852   GLUCOSE 98 07/16/2014 0523   BUN 18 07/16/2018 0852   BUN 15 07/16/2014 0523   CREATININE 1.20 07/16/2018 0852   CREATININE 0.76 07/16/2014 0523   CALCIUM 9.4 07/16/2018 0852   CALCIUM 8.9 07/16/2014 0523   PROT 8.3 (H) 07/16/2018 0852   PROT 6.1 (L) 07/08/2014 0437   ALBUMIN 3.6 07/16/2018 0852   ALBUMIN 2.4 (L) 07/08/2014 0437   AST 24 07/16/2018 0852   AST 14 (L) 07/08/2014 0437   ALT 26 07/16/2018 0852   ALT 26 07/04/2014 1844   ALKPHOS 51 07/16/2018 0852   ALKPHOS 63 07/08/2014 0437   BILITOT 0.4 07/16/2018 0852   BILITOT 0.5 07/04/2014 1844   GFRNONAA >60 07/16/2018 0852   GFRNONAA >60 07/16/2014 0523   GFRAA >60 07/16/2018 0852   GFRAA >60 07/16/2014 0523     Assessment and Plan: 1. Hairy cell leukemia not having achieved remission ([place])   2. Thrush   3. Thrombocytopenia ([place])   4. Anemia, unspecified type   5. Neutropenia, unspecified type [facility])     Labs reviewed and discussed with patient. Anemia, thrombocytopenia, neutropenia counts have gradually improved. Recommend continue monitoring CBC  every 2 weeks. No need for  blood transfusion or platelet transfusion this week. Continue take prophylactic antibiotics with Bactrim DS every Monday, Wednesday, Friday, continue Cipro 500 mg daily. Thrush, continue Diflucan 400 mg daily.  Follow Up Instructions: Repeat CBC, CMP in 1 week Lab day 1, Video Visit in 3 weeks.   I discussed the assessment and treatment plan with the patient. The patient was provided an opportunity to ask questions and all were answered. The patient agreed with the plan and demonstrated an understanding of the instructions.  The patient was advised to call back or seek an in-person evaluation if the symptoms worsen or if the condition fails to improve as anticipated.   I provided 15 minutes of face-to-face video visit time during this encounter, and > 50% was spent counseling as documented under my assessment & plan.  [name], MD 07/24/2018 7:44 PM

## 2018-07-29 ENCOUNTER — Inpatient Hospital Stay: Payer: BLUE CROSS/BLUE SHIELD

## 2018-07-29 ENCOUNTER — Other Ambulatory Visit: Payer: Self-pay

## 2018-07-29 DIAGNOSIS — C9140 Hairy cell leukemia not having achieved remission: Secondary | ICD-10-CM

## 2018-07-29 DIAGNOSIS — D61818 Other pancytopenia: Secondary | ICD-10-CM | POA: Diagnosis not present

## 2018-07-29 DIAGNOSIS — Z79899 Other long term (current) drug therapy: Secondary | ICD-10-CM | POA: Diagnosis not present

## 2018-07-29 DIAGNOSIS — F17210 Nicotine dependence, cigarettes, uncomplicated: Secondary | ICD-10-CM | POA: Diagnosis not present

## 2018-07-29 DIAGNOSIS — E119 Type 2 diabetes mellitus without complications: Secondary | ICD-10-CM | POA: Diagnosis not present

## 2018-07-29 DIAGNOSIS — I10 Essential (primary) hypertension: Secondary | ICD-10-CM | POA: Diagnosis not present

## 2018-07-29 DIAGNOSIS — D649 Anemia, unspecified: Secondary | ICD-10-CM

## 2018-07-29 DIAGNOSIS — B370 Candidal stomatitis: Secondary | ICD-10-CM | POA: Diagnosis not present

## 2018-07-29 LAB — CBC WITH DIFFERENTIAL/PLATELET
Abs Immature Granulocytes: 0.02 10*3/uL (ref 0.00–0.07)
Basophils Absolute: 0 10*3/uL (ref 0.0–0.1)
Basophils Relative: 1 %
Eosinophils Absolute: 0 10*3/uL (ref 0.0–0.5)
Eosinophils Relative: 1 %
HCT: 32.6 % — ABNORMAL LOW (ref 39.0–52.0)
Hemoglobin: 10.1 g/dL — ABNORMAL LOW (ref 13.0–17.0)
Immature Granulocytes: 1 %
Lymphocytes Relative: 22 %
Lymphs Abs: 0.8 10*3/uL (ref 0.7–4.0)
MCH: 32.1 pg (ref 26.0–34.0)
MCHC: 31 g/dL (ref 30.0–36.0)
MCV: 103.5 fL — ABNORMAL HIGH (ref 80.0–100.0)
Monocytes Absolute: 0.5 10*3/uL (ref 0.1–1.0)
Monocytes Relative: 13 %
Neutro Abs: 2.1 10*3/uL (ref 1.7–7.7)
Neutrophils Relative %: 62 %
Platelets: 290 10*3/uL (ref 150–400)
RBC: 3.15 MIL/uL — ABNORMAL LOW (ref 4.22–5.81)
RDW: 24.4 % — ABNORMAL HIGH (ref 11.5–15.5)
WBC: 3.4 10*3/uL — ABNORMAL LOW (ref 4.0–10.5)
nRBC: 0.9 % — ABNORMAL HIGH (ref 0.0–0.2)

## 2018-07-29 LAB — COMPREHENSIVE METABOLIC PANEL
ALT: 23 U/L (ref 0–44)
AST: 21 U/L (ref 15–41)
Albumin: 4.4 g/dL (ref 3.5–5.0)
Alkaline Phosphatase: 60 U/L (ref 38–126)
Anion gap: 8 (ref 5–15)
BUN: 14 mg/dL (ref 6–20)
CO2: 28 mmol/L (ref 22–32)
Calcium: 9.8 mg/dL (ref 8.9–10.3)
Chloride: 100 mmol/L (ref 98–111)
Creatinine, Ser: 1.23 mg/dL (ref 0.61–1.24)
GFR calc Af Amer: >60 mL/min (ref >60)
GFR calc non Af Amer: >60 mL/min (ref >60)
Glucose, Bld: 97 mg/dL (ref 70–99)
Potassium: 4.6 mmol/L (ref 3.5–5.1)
Sodium: 136 mmol/L (ref 135–145)
Total Bilirubin: 0.4 mg/dL (ref 0.3–1.2)
Total Protein: 9.2 g/dL — ABNORMAL HIGH (ref 6.5–8.1)

## 2018-07-30 LAB — SAMPLE TO BLOOD BANK

## 2018-08-12 ENCOUNTER — Other Ambulatory Visit: Payer: Self-pay

## 2018-08-12 ENCOUNTER — Inpatient Hospital Stay: Payer: BLUE CROSS/BLUE SHIELD | Admitting: *Deleted

## 2018-08-12 DIAGNOSIS — C9140 Hairy cell leukemia not having achieved remission: Secondary | ICD-10-CM

## 2018-08-12 DIAGNOSIS — D649 Anemia, unspecified: Secondary | ICD-10-CM

## 2018-08-12 LAB — COMPREHENSIVE METABOLIC PANEL
ALT: 23 U/L (ref 0–44)
AST: 21 U/L (ref 15–41)
Albumin: 4.7 g/dL (ref 3.5–5.0)
Alkaline Phosphatase: 54 U/L (ref 38–126)
Anion gap: 10 (ref 5–15)
BUN: 17 mg/dL (ref 6–20)
CO2: 24 mmol/L (ref 22–32)
Calcium: 9.9 mg/dL (ref 8.9–10.3)
Chloride: 106 mmol/L (ref 98–111)
Creatinine, Ser: 1.16 mg/dL (ref 0.61–1.24)
GFR calc Af Amer: >60 mL/min (ref >60)
GFR calc non Af Amer: >60 mL/min (ref >60)
Glucose, Bld: 104 mg/dL — ABNORMAL HIGH (ref 70–99)
Potassium: 4.4 mmol/L (ref 3.5–5.1)
Sodium: 140 mmol/L (ref 135–145)
Total Bilirubin: 0.6 mg/dL (ref 0.3–1.2)
Total Protein: 8.3 g/dL — ABNORMAL HIGH (ref 6.5–8.1)

## 2018-08-12 LAB — CBC WITH DIFFERENTIAL/PLATELET
Abs Immature Granulocytes: 0.02 10*3/uL (ref 0.00–0.07)
Basophils Absolute: 0 10*3/uL (ref 0.0–0.1)
Basophils Relative: 1 %
Eosinophils Absolute: 0.1 10*3/uL (ref 0.0–0.5)
Eosinophils Relative: 2 %
HCT: 38.2 % — ABNORMAL LOW (ref 39.0–52.0)
Hemoglobin: 11.8 g/dL — ABNORMAL LOW (ref 13.0–17.0)
Immature Granulocytes: 0 %
Lymphocytes Relative: 17 %
Lymphs Abs: 1 10*3/uL (ref 0.7–4.0)
MCH: 32.2 pg (ref 26.0–34.0)
MCHC: 30.9 g/dL (ref 30.0–36.0)
MCV: 104.1 fL — ABNORMAL HIGH (ref 80.0–100.0)
Monocytes Absolute: 0.7 10*3/uL (ref 0.1–1.0)
Monocytes Relative: 13 %
Neutro Abs: 4 10*3/uL (ref 1.7–7.7)
Neutrophils Relative %: 67 %
Platelets: 317 10*3/uL (ref 150–400)
RBC: 3.67 MIL/uL — ABNORMAL LOW (ref 4.22–5.81)
RDW: 23.7 % — ABNORMAL HIGH (ref 11.5–15.5)
WBC: 5.9 10*3/uL (ref 4.0–10.5)
nRBC: 0 % (ref 0.0–0.2)

## 2018-08-12 LAB — SAMPLE TO BLOOD BANK

## 2018-08-13 ENCOUNTER — Other Ambulatory Visit: Payer: Self-pay

## 2018-08-13 ENCOUNTER — Encounter: Payer: Self-pay | Admitting: Oncology

## 2018-08-13 ENCOUNTER — Inpatient Hospital Stay (HOSPITAL_BASED_OUTPATIENT_CLINIC_OR_DEPARTMENT_OTHER): Payer: BLUE CROSS/BLUE SHIELD | Admitting: Oncology

## 2018-08-13 DIAGNOSIS — D649 Anemia, unspecified: Secondary | ICD-10-CM | POA: Diagnosis not present

## 2018-08-13 DIAGNOSIS — F17210 Nicotine dependence, cigarettes, uncomplicated: Secondary | ICD-10-CM

## 2018-08-13 DIAGNOSIS — Z79899 Other long term (current) drug therapy: Secondary | ICD-10-CM | POA: Diagnosis not present

## 2018-08-13 DIAGNOSIS — C9140 Hairy cell leukemia not having achieved remission: Secondary | ICD-10-CM

## 2018-08-13 NOTE — Progress Notes (Signed)
Called patient for Telehealth visit via [employer].  Patient states no new concerns today.  

## 2018-08-17 NOTE — Progress Notes (Signed)
HEMATOLOGY-ONCOLOGY TeleHEALTH VISIT PROGRESS NOTE  I connected with Roy Valentine on 07/22/18 at  1:45 PM EDT by video enabled telemedicine visit and verified that I am speaking with the correct person using two identifiers. I discussed the limitations, risks, security and privacy concerns of performing an evaluation and management service by telemedicine and the availability of in-person appointments. I also discussed with the patient that there may be a patient responsible charge related to this service. The patient expressed understanding and agreed to proceed.   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 male who has above history reviewed by me today presents for follow up visit for management of management of hairy cell leukemia and pancytopenia Problems and complaints are listed below:  He reports feeling better. Has gone back to work.  Denies weight loss, fever, chills, fatigue, night sweats.    Review of Systems  Constitutional: Positive for fatigue. Negative for appetite change, chills, fever and unexpected weight change.  HENT:   Negative for hearing loss and voice change.   Eyes: Negative for eye problems and icterus.  Respiratory: Negative for chest tightness, cough and shortness of breath.   Cardiovascular: Negative for chest pain and leg swelling.  Gastrointestinal: Negative for abdominal distention and abdominal pain.  Endocrine: Negative for hot flashes.  Genitourinary: Negative for difficulty urinating, dysuria and frequency.   Musculoskeletal: Negative for arthralgias.  Skin: Negative for itching and rash.  Neurological: Negative for light-headedness and numbness.  Hematological: Negative for adenopathy.  Does not bruise/bleed easily.  Psychiatric/Behavioral: Negative for confusion.    Past Medical History:  Diagnosis Date  . Diabetes mellitus without complication ([place])   . 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 Hypertension    Past Surgical History:  Procedure Laterality Date  . COLON SURGERY      Family History  Problem Relation Age of Onset  . Thyroid disease Mother   . Alzheimer's disease Mother     Social History   Socioeconomic History  . Marital status: Single    Spouse name: Not on file  . Number of children: Not on file  . Years of education: Not on file  . Highest education level: Not on file  Occupational History  . Not on file  Social Needs  . Financial resource strain: Not on file  . Food insecurity:    Worry: Not on file    Inability: Not on file  . Transportation needs:    Medical: Not on file    Non-medical: Not on file  Tobacco Use  . Smoking status: Current Every Day Smoker    Packs/day: 1.00    Years: 30.00    Pack years: 30.00  . Smokeless tobacco: Never Used  Substance and Sexual Activity  . Alcohol use: Never    Frequency: Never  . Drug use: Yes    Types: Marijuana    Comment: smoked marijuana in college  . Sexual activity: Not on file  Lifestyle  . Physical activity:    Days per week: Not on file    Minutes per session: Not on file  . Stress: Not on file  Relationships  . Social connections:    Talks on phone: Not on file    Gets together:  Not on file    Attends religious service: Not on file    Active member of club or organization: Not on file    Attends meetings of clubs or organizations: Not on file    Relationship status: Not on file  . Intimate partner violence:    Fear of current or ex partner: Not on file    Emotionally abused: Not on file    Physically abused: Not on file    Forced sexual activity: Not on file  Other Topics Concern  . Not on file  Social History Narrative  . Not on file    Current Outpatient  Medications on File Prior to Visit  Medication Sig Dispense Refill  . buPROPion (WELLBUTRIN XL) 150 MG 24 hr tablet Take 150 mg by mouth daily.    Marland Kitchen lisinopril (PRINIVIL,ZESTRIL) 5 MG tablet Take 5 mg by mouth daily.    . metoprolol succinate (TOPROL-XL) 25 MG 24 hr tablet Take 1 tablet by mouth daily.    . prochlorperazine (COMPAZINE) 10 MG tablet Take 1 tablet (10 mg total) by mouth every 6 (six) hours as needed for nausea or vomiting. 30 tablet 0  . Semaglutide (RYBELSUS) 7 MG TABS Take 7 mg by mouth daily.    . Vitamin D, Ergocalciferol, (DRISDOL) 1.25 MG (50000 UT) CAPS capsule Take 50,000 Units by mouth once a week.     No current facility-administered medications on file prior to visit.     No Known Allergies     Observations/Objective: Today's Vitals   08/13/18 1313  PainSc: 0-No pain   There is no height or weight on file to calculate BMI.  Physical Exam  Constitutional: He is oriented to person, place, and time. No distress.  HENT:  Head: Normocephalic.  Neurological: He is alert and oriented to person, place, and time.  Psychiatric: Affect normal.    CBC    Component Value Date/Time   WBC 5.9 08/12/2018 1337   RBC 3.67 (L) 08/12/2018 1337   HGB 11.8 (L) 08/12/2018 1337   HGB 15.9 07/12/2014 0501   HCT 38.2 (L) 08/12/2018 1337   HCT 47.4 07/12/2014 0501   PLT 317 08/12/2018 1337   PLT 230 07/12/2014 0501   MCV 104.1 (H) 08/12/2018 1337   MCV 89 07/12/2014 0501   MCH 32.2 08/12/2018 1337   MCHC 30.9 08/12/2018 1337   RDW 23.7 (H) 08/12/2018 1337   RDW 15.0 (H) 07/12/2014 0501   LYMPHSABS 1.0 08/12/2018 1337   LYMPHSABS 1.7 07/12/2014 0501   MONOABS 0.7 08/12/2018 1337   MONOABS 1.7 (H) 07/12/2014 0501   EOSABS 0.1 08/12/2018 1337   EOSABS 0.4 07/12/2014 0501   BASOSABS 0.0 08/12/2018 1337   BASOSABS 0.1 07/12/2014 0501    CMP     Component Value Date/Time   NA 140 08/12/2018 1337   NA 134 (L) 07/16/2014 0523   K 4.4 08/12/2018 1337   K 4.2  07/16/2014 0523   CL 106 08/12/2018 1337   CL 97 (L) 07/16/2014 0523   CO2 24 08/12/2018 1337   CO2 29 07/16/2014 0523   GLUCOSE 104 (H) 08/12/2018 1337   GLUCOSE 98 07/16/2014 0523   BUN 17 08/12/2018 1337   BUN 15 07/16/2014 0523   CREATININE 1.16 08/12/2018 1337   CREATININE 0.76 07/16/2014 0523   CALCIUM 9.9 08/12/2018 1337   CALCIUM 8.9 07/16/2014 0523   PROT 8.3 (H) 08/12/2018 1337   PROT 6.1 (L) 07/08/2014 0437   ALBUMIN 4.7 08/12/2018 1337  ALBUMIN 2.4 (L) 07/08/2014 0437   AST 21 08/12/2018 1337   AST 14 (L) 07/08/2014 0437   ALT 23 08/12/2018 1337   ALT 26 07/04/2014 1844   ALKPHOS 54 08/12/2018 1337   ALKPHOS 63 07/08/2014 0437   BILITOT 0.6 08/12/2018 1337   BILITOT 0.5 07/04/2014 1844   GFRNONAA >60 08/12/2018 1337   GFRNONAA >60 07/16/2014 0523   GFRAA >60 08/12/2018 1337   GFRAA >60 07/16/2014 0523     Assessment and Plan: 1. Hairy cell leukemia not having achieved remission ([place])   2. Anemia, unspecified type   Labs are reviewed and discussed with patient. Blood counts are recovering.  Neutropenia has resolved. Stop prophylactic antibiotics-Cipro and Bactrim.   CDC recommended precautions discussed in details with patient.  Anemia, improving, no need for blood transfusion.     Follow Up Instructions: Repeat CBC, CMP in 4 weeks.  I discussed the assessment and treatment plan with the patient. The patient was provided an opportunity to ask questions and all were answered. The patient agreed with the plan and demonstrated an understanding of the instructions.  The patient was advised to call back or seek an in-person evaluation if the symptoms worsen or if the condition fails to improve as anticipated.   I provided 15 minutes of face-to-face video visit time during this encounter, and > 50% was spent counseling as documented under my assessment & plan.  [name], MD 08/17/2018 5:50 PM

## 2018-09-10 ENCOUNTER — Inpatient Hospital Stay: Payer: BLUE CROSS/BLUE SHIELD | Admitting: Oncology

## 2018-09-10 ENCOUNTER — Inpatient Hospital Stay: Payer: BLUE CROSS/BLUE SHIELD

## 2018-09-12 ENCOUNTER — Encounter: Payer: Self-pay | Admitting: Oncology

## 2018-09-12 ENCOUNTER — Other Ambulatory Visit: Payer: Self-pay

## 2018-09-12 ENCOUNTER — Inpatient Hospital Stay: Payer: BLUE CROSS/BLUE SHIELD | Attending: Oncology

## 2018-09-12 ENCOUNTER — Inpatient Hospital Stay (HOSPITAL_BASED_OUTPATIENT_CLINIC_OR_DEPARTMENT_OTHER): Payer: BLUE CROSS/BLUE SHIELD | Admitting: Oncology

## 2018-09-12 VITALS — BP 118/78 | HR 79 | Temp 96.0°F | Resp 18 | Wt 164.0 lb

## 2018-09-12 DIAGNOSIS — C9141 Hairy cell leukemia, in remission: Secondary | ICD-10-CM | POA: Diagnosis not present

## 2018-09-12 DIAGNOSIS — C9140 Hairy cell leukemia not having achieved remission: Secondary | ICD-10-CM

## 2018-09-12 DIAGNOSIS — D649 Anemia, unspecified: Secondary | ICD-10-CM

## 2018-09-12 LAB — CBC WITH DIFFERENTIAL/PLATELET
Abs Immature Granulocytes: 0.03 10*3/uL (ref 0.00–0.07)
Basophils Absolute: 0 10*3/uL (ref 0.0–0.1)
Basophils Relative: 1 %
Eosinophils Absolute: 0.2 10*3/uL (ref 0.0–0.5)
Eosinophils Relative: 3 %
HCT: 45.7 % (ref 39.0–52.0)
Hemoglobin: 14.1 g/dL (ref 13.0–17.0)
Immature Granulocytes: 0 %
Lymphocytes Relative: 10 %
Lymphs Abs: 0.8 10*3/uL (ref 0.7–4.0)
MCH: 31.3 pg (ref 26.0–34.0)
MCHC: 30.9 g/dL (ref 30.0–36.0)
MCV: 101.3 fL — ABNORMAL HIGH (ref 80.0–100.0)
Monocytes Absolute: 0.8 10*3/uL (ref 0.1–1.0)
Monocytes Relative: 10 %
Neutro Abs: 5.7 10*3/uL (ref 1.7–7.7)
Neutrophils Relative %: 76 %
Platelets: 251 10*3/uL (ref 150–400)
RBC: 4.51 MIL/uL (ref 4.22–5.81)
RDW: 17.7 % — ABNORMAL HIGH (ref 11.5–15.5)
WBC: 7.6 10*3/uL (ref 4.0–10.5)
nRBC: 0 % (ref 0.0–0.2)

## 2018-09-12 LAB — COMPREHENSIVE METABOLIC PANEL
ALT: 18 U/L (ref 0–44)
AST: 15 U/L (ref 15–41)
Albumin: 4.1 g/dL (ref 3.5–5.0)
Alkaline Phosphatase: 65 U/L (ref 38–126)
Anion gap: 8 (ref 5–15)
BUN: 17 mg/dL (ref 6–20)
CO2: 25 mmol/L (ref 22–32)
Calcium: 9.6 mg/dL (ref 8.9–10.3)
Chloride: 106 mmol/L (ref 98–111)
Creatinine, Ser: 0.94 mg/dL (ref 0.61–1.24)
GFR calc Af Amer: >60 mL/min (ref >60)
GFR calc non Af Amer: >60 mL/min (ref >60)
Glucose, Bld: 152 mg/dL — ABNORMAL HIGH (ref 70–99)
Potassium: 4.1 mmol/L (ref 3.5–5.1)
Sodium: 139 mmol/L (ref 135–145)
Total Bilirubin: 0.4 mg/dL (ref 0.3–1.2)
Total Protein: 7.4 g/dL (ref 6.5–8.1)

## 2018-09-12 LAB — SAMPLE TO BLOOD BANK

## 2018-09-12 NOTE — Progress Notes (Signed)
Patient here for follow up. No concerns voiced.  °

## 2018-09-12 NOTE — Progress Notes (Signed)
Hematology/Oncology Consult note [facility] Telephone:([phone] Fax:([phone]   Patient Care Team: Perrin Maltese, MD as PCP - General (Internal Medicine)  REFERRING PROVIDER: Dr.Khan. REASON FOR VISIT:  Follow up for management of pancytopenia, hairy cell leukemia  HISTORY OF PRESENTING ILLNESS:  Roy Valentine is a  48 y.o.  male with PMH listed below who was referred to me for evaluation of pancytopenia.  Patient recently follows up with pcp and had lab work up done.  Cbc showed hemoglobin of 7, leukopenia with ANC 0.1, thrombocytopenia with platelet count   Fatigue: reports worsening fatigue. Chronic onset, perisistent, for the past few months. no aggravating or improving factors, no associated symptoms.  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    He has ED visit on 08/12/2017 due to cough and fever. CXR showed pneumonia.  Cbc on 08/12/2017 showed wbc 1.7, hemoglobin 16, platelet 247,000.  No differential was checked at that time. Patient was given a course of antibiotics and per patient his symptoms improved.   Denies hematochezia, hematuria, hematemesis, epistaxis, black tarry stool or easy bruising.  Denies fever or chills, any recent frequent infections.  He has lost about 10 pounds since May 2019. Appetite is fair.   # Bone marrow biopsy was sent to [facility] for second opinion.  Comments as below.  A. Peripheral blood and bone marrow biopsy; Outside consult, [id number], [facility], [place], [place]. Date of procedure 05-12-18: Small mature B-cell neoplasm. 70% leukemic infiltrate in a 75% cellular bone marrow. Markedly decreased hematopoiesis with focal moderate reticulin fibrosis. Moderate to marked pancytopenia with monocytopenia. See comment.  Comment: The neoplastic involvement in the bone marrow assumes interstitial infiltrate with aggregates of small mature lymphocytes containing moderate amount of  cytoplasm. Abnormal lymphocytes with hairy-like projects are occasionally seen on the aspirate smear, even though the aspirate is apparently hemodilute. Per the outside report, flow cytometric analysis demonstrates 3% lambda light chain restricted B-cell population that is positive for CD103 and CD25. 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 So far, the findings do not support the diagnosis of hairy cell leukemia variant; however, if hairy cell leukemia variant is considered as an alternative, molecular diagnostic test for BRAF [id number] mutation could be performed to rule out the possibility and confirm the diagnosis of hairy cell leukemia. This case was reviewed in our hematopathology QA conference, and a consensus was reached. We appreciate your sending this interesting case in consultation and entirely agree with your assessment and diagnosis of this case.   [facility] pathology opinion was discussed with patient in detail.     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 male who has above history reviewed by me today presents for follow up visit for management of hairy cell leukemia,  Patient has been doing really well.  Denies any fatigue.  Denies fever, chills, cough, shortness of breath and a sore throat.  No abdominal pain or diarrhea. Not on prophylactic antibiotics. He has restarted working. No new complaints. Review of Systems  Constitutional: Negative for appetite change, chills, fatigue, fever and unexpected weight change.  HENT:   Negative for hearing loss and voice change.   Eyes: Negative for eye problems and icterus.  Respiratory:  Negative for chest tightness, cough and shortness of breath.   Cardiovascular:  Negative for chest pain and leg swelling.  Gastrointestinal: Negative for abdominal distention, abdominal pain, constipation and nausea.  Endocrine: Negative for hot flashes.  Genitourinary: Negative for difficulty urinating, dysuria and frequency.   Musculoskeletal: Negative for arthralgias.  Skin: Negative for itching and rash.  Neurological: Negative for light-headedness and numbness.  Hematological: Negative for adenopathy. Does not bruise/bleed easily.  Psychiatric/Behavioral: Negative for confusion.    MEDICAL HISTORY:  Past Medical History:  Diagnosis Date  . Diabetes mellitus without complication ([place])   . Hairy cell leukemia ([place]) 06/03/2018  . Hypertension     SURGICAL HISTORY: Past Surgical History:  Procedure Laterality Date  . COLON SURGERY      SOCIAL HISTORY: Social History   Socioeconomic History  . Marital status: Single    Spouse name: Not on file  . Number of children: Not on file  . Years of education: Not on file  . Highest education level: Not on file  Occupational History  . Not on file  Social Needs  . Financial resource strain: Not on file  . Food insecurity    Worry: Not on file    Inability: Not on file  . Transportation needs    Medical: Not on file    Non-medical: Not on file  Tobacco Use  . Smoking status: Current Every Day Smoker    Packs/day: 1.00    Years: 30.00    Pack years: 30.00  . Smokeless tobacco: Never Used  Substance and Sexual Activity  . Alcohol use: Never    Frequency: Never  . Drug use: Yes    Types: Marijuana    Comment: smoked marijuana in college  . Sexual activity: Not on file  Lifestyle  . Physical activity    Days per week: Not on file    Minutes per session: Not on file  . Stress: Not on file  Relationships  . Social Herbalist on phone: Not on file    Gets together: Not on file    Attends religious service: Not on file    Active member of club or organization: Not on file    Attends  meetings of clubs or organizations: Not on file    Relationship status: Not on file  . Intimate partner violence    Fear of current or ex partner: Not on file    Emotionally abused: Not on file    Physically abused: Not on file    Forced sexual activity: Not on file  Other Topics Concern  . Not on file  Social History Narrative  . Not on file    FAMILY HISTORY: Family History  Problem Relation Age of Onset  . Thyroid disease Mother   . Alzheimer's disease Mother     ALLERGIES:  has No Known Allergies.  MEDICATIONS:  Current Outpatient Medications  Medication Sig Dispense Refill  . buPROPion (WELLBUTRIN XL) 150 MG 24 hr tablet Take 150 mg by mouth daily.    Marland Kitchen lisinopril (PRINIVIL,ZESTRIL) 5 MG tablet Take 5 mg by mouth daily.    . metoprolol succinate (TOPROL-XL) 25 MG 24 hr tablet Take 1 tablet by mouth daily.    . prochlorperazine (COMPAZINE) 10 MG tablet Take 1 tablet (10 mg total) by mouth every 6 (six) hours as needed for nausea or vomiting. 30 tablet 0  . Vitamin D, Ergocalciferol, (DRISDOL) 1.25 MG (50000 UT) CAPS capsule Take 50,000 Units by  mouth once a week.    . Semaglutide (RYBELSUS) 7 MG TABS Take 7 mg by mouth daily.     No current facility-administered medications for this visit.      PHYSICAL EXAMINATION: ECOG PERFORMANCE STATUS: 1 - Symptomatic but completely ambulatory Vitals:   09/12/18 0839  BP: 118/78  Pulse: 79  Resp: 18  Temp: (!) 96 F (35.6 C)  SpO2: 98%   Filed Weights   09/12/18 0839  Weight: 164 lb (74.4 kg)    Physical Exam Constitutional:      General: He is not in acute distress. HENT:     Head: Normocephalic and atraumatic.     Mouth/Throat:     Comments: Thrush  Eyes:     General: No scleral icterus.    Pupils: Pupils are equal, round, and reactive to light.  Neck:     Musculoskeletal: Normal range of motion and neck supple.  Cardiovascular:     Rate and Rhythm: Normal rate and regular rhythm.     Heart sounds: Normal  heart sounds.  Pulmonary:     Effort: Pulmonary effort is normal. No respiratory distress.     Breath sounds: No wheezing.  Abdominal:     General: Bowel sounds are normal. There is no distension.     Palpations: Abdomen is soft. There is no mass.     Tenderness: There is no abdominal tenderness.  Musculoskeletal: Normal range of motion.        General: No deformity.  Skin:    General: Skin is warm and dry.     Coloration: Skin is not pale.     Findings: No erythema or rash.  Neurological:     Mental Status: He is alert and oriented to person, place, and time.     Cranial Nerves: No cranial nerve deficit.     Coordination: Coordination normal.  Psychiatric:        Behavior: Behavior normal.        Thought Content: Thought content normal.     RADIOGRAPHIC STUDIES: I have personally reviewed the radiological images as listed and agreed with the findings in the report.  CMP Latest Ref Rng & Units 09/12/2018  Glucose 70 - 99 mg/dL 152(H)  BUN 6 - 20 mg/dL 17  Creatinine 0.61 - 1.24 mg/dL 0.94  Sodium 135 - 145 mmol/L 139  Potassium 3.5 - 5.1 mmol/L 4.1  Chloride 98 - 111 mmol/L 106  CO2 22 - 32 mmol/L 25  Calcium 8.9 - 10.3 mg/dL 9.6  Total Protein 6.5 - 8.1 g/dL 7.4  Total Bilirubin 0.3 - 1.2 mg/dL 0.4  Alkaline Phos 38 - 126 U/L 65  AST 15 - 41 U/L 15  ALT 0 - 44 U/L 18   CBC Latest Ref Rng & Units 09/12/2018  WBC 4.0 - 10.5 K/uL 7.6  Hemoglobin 13.0 - 17.0 g/dL 14.1  Hematocrit 39.0 - 52.0 % 45.7  Platelets 150 - 400 K/uL 251    LABORATORY DATA:  I have reviewed the data as listed Lab Results  Component Value Date   WBC 7.6 09/12/2018   HGB 14.1 09/12/2018   HCT 45.7 09/12/2018   MCV 101.3 (H) 09/12/2018   PLT 251 09/12/2018   Recent Labs    07/29/18 1330 08/12/18 1337 09/12/18 0817  NA 136 140 139  K 4.6 4.4 4.1  CL 100 106 106  CO2 _0 GLUCOSE 97 104* 152*  BUN _1 CREATININE 1.23 1.16 0.94  CALCIUM 9.8 9.9 9.6  GFRNONAA >60 >60 >60   GFRAA >60 >60 >60  PROT 9.2* 8.3* 7.4  ALBUMIN 4.4 4.7 4.1  AST _0 ALT _1 ALKPHOS 60 54 65  BILITOT 0.4 0.6 0.4   Iron/TIBC/Ferritin/ %Sat    Component Value Date/Time   IRON 158 05/07/2018 1558   TIBC 403 05/07/2018 1558   FERRITIN 634 (H) 05/07/2018 1558   IRONPCTSAT 39 05/07/2018 1558     05/07/2018 peripheral blood flow cytometry showed CD103+, CD11c+, CD 25+, clonal B-cell population detected, representing 3% of the leukocytes, phenotype consistent with hairy cell leukemia.  05/12/2018 bone marrow biopsy Diagnosis Bone Marrow, Aspirate,Biopsy, and Clot, right iliac - HYPERCELLULAR BONE MARROW FOR AGE WITH INVOLVEMENT BY A B-CELL LYMPHOPROLIFERATIVE DISORDER. - SEE COMMENT. PERIPHERAL BLOOD: - PANCYTOPENIA. Diagnosis Note The overall material is suboptimal with lack of aspirate. Limited touch imprints and a core biopsy show extensive involvement by a B-cell lymphoproliferative process of primarily small to medium sized lymphocytes with a predominant interstitial pattern of involvement. Flow cytometric analysis was attempted but much of the material likely represents a peripheralized sample and is not considered entirely contributory. Immunohistochemical stains show that B-cells weakly express cyclin D1 but lack CD5, CD10, CD34, CD138, or TdT expression. 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 A repeat biopsy with fresh core biopsy material for flow cytometric study is strongly recommended for definite and accurate sub-classification of this process. (BNS:ecj 05/14/2018)   ASSESSMENT & PLAN:  1. Hairy cell leukemia, in remission ([place])    #Labs are reviewed and discussed with patient.  Hematologically he has complete response. No longer transfusion dependent. Pancytopenia has completely resolved. Recommend to repeat bone marrow biopsy in the near future for assessment of treatment response. Patient prefers to do it next year after  he gets insurance.  Repeat blood work in 3 months.  Follow-up in the clinic. Marland Kitchen    [name], MD, PhD Hematology Oncology [facility] at [facility] Pager- [phone] 09/12/2018

## 2018-11-14 ENCOUNTER — Telehealth: Payer: Self-pay

## 2018-11-14 NOTE — Telephone Encounter (Signed)
Telephone call to patient to discuss Survivorship visit.  Left message for patient to call me to set up his SCP visit and permission to mail packet to home.  Will mail information and schedule visit when I get a return call.

## 2018-11-20 ENCOUNTER — Telehealth: Payer: Self-pay

## 2018-11-20 NOTE — Telephone Encounter (Signed)
Telephone call to patient to discuss SCP visit.  Patient in agreement for SCP and treatment summary to be mailed and will call patient on 12/04/2018 at 2:00 to review SCP and discuss post chemo care.

## 2018-12-04 ENCOUNTER — Inpatient Hospital Stay: Payer: BLUE CROSS/BLUE SHIELD | Attending: Oncology | Admitting: Oncology

## 2018-12-04 DIAGNOSIS — C9140 Hairy cell leukemia not having achieved remission: Secondary | ICD-10-CM

## 2018-12-04 NOTE — Progress Notes (Signed)
Survivorship Care Plan visit completed.  Treatment summary reviewed and previously maided to patient.  ASCO answers booklet reviewed and given to patient.  CARE program and Cancer Transitions discussed with patient along with other resources cancer center offers to patients and caregivers.  Patient verbalized understanding. 

## 2018-12-04 NOTE — Progress Notes (Deleted)
Survivorship Clinic Consult Note [facility]  Telephone:([phone] Fax:([phone]  CLINIC:  Survivorship  REASON FOR VISIT:  Long-term survivorship surveillance visit for patient with history of hairy cell luekemia  BRIEF ONCOLOGIC HISTORY:  Oncology History  Hairy cell leukemia ([name])  06/03/2018 Initial Diagnosis   Hairy cell leukemia ([name])   06/09/2018 -  Chemotherapy   The patient had cladribine (LEUSTATIN) 12 mg in sodium chloride 0.9 % 250 mL chemo infusion, 0.14 mg/kg = 12 mg (93.3 % of original dose 0.15 mg/kg), Intravenous,  Once, 1 of 1 cycle Dose modification: 0.14 mg/kg (original dose 0.15 mg/kg, Cycle 1, Reason: Provider Judgment) Administration: 12 mg (06/09/2018), 12 mg (06/10/2018), 12 mg (06/11/2018), 12 mg (06/12/2018), 12 mg (06/13/2018)  for chemotherapy treatment.       INTERVAL HISTORY:      ADDITIONAL REVIEW OF SYSTEMS:  Review of Systems  Constitutional: Negative.  Negative for chills, fever, malaise/fatigue and weight loss.  HENT: Negative for congestion, ear pain and tinnitus.   Eyes: Negative.  Negative for blurred vision and double vision.  Respiratory: Negative.  Negative for cough, sputum production and shortness of breath.   Cardiovascular: Negative.  Negative for chest pain, palpitations and leg swelling.  Gastrointestinal: Negative.  Negative for abdominal pain, constipation, diarrhea, nausea and vomiting.  Genitourinary: Negative for dysuria, frequency and urgency.  Musculoskeletal: Negative for back pain and falls.  Skin: Negative.  Negative for rash.  Neurological: Negative.  Negative for weakness and headaches.  Endo/Heme/Allergies: Negative.  Does not bruise/bleed easily.  Psychiatric/Behavioral: Negative.  Negative for depression. The patient is not nervous/anxious and does not have insomnia.      PAST MEDICAL & SURGICAL HISTORY:  Past Medical History:  Diagnosis Date  . Diabetes mellitus without  complication ([name])   . Hairy cell leukemia ([place]) 06/03/2018  . Hypertension    Past Surgical History:  Procedure Laterality Date  . COLON SURGERY      SOCIAL HISTORY:  ***   CURRENT MEDICATIONS:  Current Outpatient Medications on File Prior to Visit  Medication Sig Dispense Refill  . buPROPion (WELLBUTRIN XL) 150 MG 24 hr tablet Take 150 mg by mouth daily.    Marland Valentine lisinopril (PRINIVIL,ZESTRIL) 5 MG tablet Take 5 mg by mouth daily.    . metoprolol succinate (TOPROL-XL) 25 MG 24 hr tablet Take 1 tablet by mouth daily.    . prochlorperazine (COMPAZINE) 10 MG tablet Take 1 tablet (10 mg total) by mouth every 6 (six) hours as needed for nausea or vomiting. 30 tablet 0  . Semaglutide (RYBELSUS) 7 MG TABS Take 7 mg by mouth daily.    . Vitamin D, Ergocalciferol, (DRISDOL) 1.25 MG (50000 UT) CAPS capsule Take 50,000 Units by mouth once a week.     No current facility-administered medications on file prior to visit.     ALLERGIES:  No Known Allergies  PHYSICAL EXAM:  There were no vitals filed for this visit. There were no vitals filed for this visit.  Physical Exam  LABORATORY DATA:  None at this visit.***  DIAGNOSTIC IMAGING:  None at this visit.    ASSESSMENT & PLAN:  Roy Valentine is a pleasant 48 y.o. male/male*** with history of Stage ***, treated with ***; completed treatment on ***.  Patient presents to survivorship clinic today for survivorship care plan visit and to address any acute survivorship concerns since completing treatment.    1. History of *** cancer: Clinically, he is without evidence of disease recurrence  based on physical exam/diagnostic imaging.  Today, he received a copy of his survivorship care plan (SCP) document, which was reviewed with him in detail.  The SCP details his cancer treatment history and potential late/long-term side effects of those treatments.  We discussed the follow-up schedule he can anticipate with interval imaging for surveillance of his  cancer.  I have also shared a copy of his treatment summary/SCP with his PCP.  Roy Valentine will return to the survivorship clinic as needed; he will return to [place] at [facility] for surveillance visit with Dr. Marland Valentine in *** months.    2. Problem at visit:   3. Smoking cessation: I commended Roy Valentine continued efforts to remain tobacco-free.  We discussed that one of the most important risk reduction strategies in preventing cancer recurrence in lung cancer patients is smoking cessation.  He is committed to abstaining from tobacco.  4. Physical activity/Healthy eating: Getting adequate physical activity and maintaining a healthy diet as a cancer survivor is important for overall wellness and reduces the risk of cancer recurrence. We discussed the CARE program which is a fitness program that is offered to cancer survivors free of charge.  We also reviewed the American Cancer Society's booklet with recommendations for nutrition and physical activity.    4. Health promotion/Cancer screening:  Roy Valentine is reportedly up-to-date on his colonoscopy, pap smear, PSA tests, skin screenings, and vaccinations.  I encouraged him to talk with his PCP about arranging appropriate cancer screening tests, as appropriate.   5. Support services/Counseling: Roy Valentine was seen today in in effort to address both the physical and social concerns of our cancer survivors at [facility] at [facility]. It is not uncommon for this period of the patient's cancer care trajectory to be one of many emotions and stressors.  I provided support today through active listening, validation of concerns, and expressive supportive counseling.  Roy Valentine was encouraged to take advantage of our support services programs and support groups to better cope in his new life as a cancer survivor after completing anti-cancer treatment.   Dispo:  -Return to survivorship clinic as needed; no additional follow-up needed at this  time.  -Consider transitioning the patient to long-term survivorship, when clinically appropriate.   A total of 30 minutes was spent in face-to-face care of this patient, with greater than 50% of that time spent in counseling and care coordination.    Rulon Abide, AGNP-C [place] at [place] (office) 12/04/18 3:02 PM

## 2018-12-15 ENCOUNTER — Inpatient Hospital Stay: Payer: BLUE CROSS/BLUE SHIELD

## 2018-12-15 ENCOUNTER — Encounter: Payer: Self-pay | Admitting: Oncology

## 2018-12-15 ENCOUNTER — Inpatient Hospital Stay (HOSPITAL_BASED_OUTPATIENT_CLINIC_OR_DEPARTMENT_OTHER): Payer: BLUE CROSS/BLUE SHIELD | Admitting: Oncology

## 2018-12-15 ENCOUNTER — Other Ambulatory Visit: Payer: Self-pay

## 2018-12-15 VITALS — BP 129/84 | HR 88 | Temp 96.4°F | Resp 18 | Wt 179.5 lb

## 2018-12-15 DIAGNOSIS — C9140 Hairy cell leukemia not having achieved remission: Secondary | ICD-10-CM

## 2018-12-15 LAB — COMPREHENSIVE METABOLIC PANEL
ALT: 22 U/L (ref 0–44)
AST: 19 U/L (ref 15–41)
Albumin: 4.3 g/dL (ref 3.5–5.0)
Alkaline Phosphatase: 64 U/L (ref 38–126)
Anion gap: 7 (ref 5–15)
BUN: 14 mg/dL (ref 6–20)
CO2: 30 mmol/L (ref 22–32)
Calcium: 9.8 mg/dL (ref 8.9–10.3)
Chloride: 99 mmol/L (ref 98–111)
Creatinine, Ser: 0.93 mg/dL (ref 0.61–1.24)
GFR calc Af Amer: >60 mL/min (ref >60)
GFR calc non Af Amer: >60 mL/min (ref >60)
Glucose, Bld: 211 mg/dL — ABNORMAL HIGH (ref 70–99)
Potassium: 4 mmol/L (ref 3.5–5.1)
Sodium: 136 mmol/L (ref 135–145)
Total Bilirubin: 0.5 mg/dL (ref 0.3–1.2)
Total Protein: 7.8 g/dL (ref 6.5–8.1)

## 2018-12-15 LAB — CBC WITH DIFFERENTIAL/PLATELET
Abs Immature Granulocytes: 0.04 10*3/uL (ref 0.00–0.07)
Basophils Absolute: 0.1 10*3/uL (ref 0.0–0.1)
Basophils Relative: 1 %
Eosinophils Absolute: 0.2 10*3/uL (ref 0.0–0.5)
Eosinophils Relative: 3 %
HCT: 51.5 % (ref 39.0–52.0)
Hemoglobin: 16.9 g/dL (ref 13.0–17.0)
Immature Granulocytes: 1 %
Lymphocytes Relative: 18 %
Lymphs Abs: 1.4 10*3/uL (ref 0.7–4.0)
MCH: 29.2 pg (ref 26.0–34.0)
MCHC: 32.8 g/dL (ref 30.0–36.0)
MCV: 88.9 fL (ref 80.0–100.0)
Monocytes Absolute: 0.6 10*3/uL (ref 0.1–1.0)
Monocytes Relative: 7 %
Neutro Abs: 5.2 10*3/uL (ref 1.7–7.7)
Neutrophils Relative %: 70 %
Platelets: 213 10*3/uL (ref 150–400)
RBC: 5.79 MIL/uL (ref 4.22–5.81)
RDW: 16.3 % — ABNORMAL HIGH (ref 11.5–15.5)
WBC: 7.4 10*3/uL (ref 4.0–10.5)
nRBC: 0 % (ref 0.0–0.2)

## 2018-12-15 NOTE — Progress Notes (Signed)
Hematology/Oncology follow up note [facility] Telephone:([phone]) [phone] Fax:(336) [phone]   Patient Care Team: Perrin Maltese, MD as PCP - General (Internal Medicine) [name], MD as Consulting Physician (Oncology)  REFERRING PROVIDER: Dr.Khan. REASON FOR VISIT:  Follow up for management of pancytopenia, hairy cell leukemia  HISTORY OF PRESENTING ILLNESS:  Roy Valentine is a  48 y.o.  male with PMH listed below who was referred to me for evaluation of pancytopenia.  Patient recently follows up with pcp and had lab work up done.  Cbc showed hemoglobin of 7, leukopenia with ANC 0.1, thrombocytopenia with platelet count   Fatigue: reports worsening fatigue. Chronic onset, perisistent, for the past few months. no aggravating or improving factors, no associated symptoms.  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    He has ED visit on 08/12/2017 due to cough and fever. CXR showed pneumonia.  Cbc on 08/12/2017 showed wbc 1.7, hemoglobin 16, platelet 247,000.  No differential was checked at that time. Patient was given a course of antibiotics and per patient his symptoms improved.   Denies hematochezia, hematuria, hematemesis, epistaxis, black tarry stool or easy bruising.  Denies fever or chills, any recent frequent infections.  He has lost about 10 pounds since May 2019. Appetite is fair.   # Bone marrow biopsy was sent to [facility] for second opinion.  Comments as below.  A. Peripheral blood and bone marrow biopsy; Outside consult, [id number], [facility], [place], [place]. Date of procedure 05-12-18: Small mature B-cell neoplasm. 70% leukemic infiltrate in a 75% cellular bone marrow. Markedly decreased hematopoiesis with focal moderate reticulin fibrosis. Moderate to marked pancytopenia with monocytopenia. See comment.  Comment: The neoplastic involvement in the bone marrow assumes interstitial infiltrate with aggregates of small  mature lymphocytes containing moderate amount of cytoplasm. Abnormal lymphocytes with hairy-like projects are occasionally seen on the aspirate smear, even though the aspirate is apparently hemodilute. Per the outside report, flow cytometric analysis demonstrates 3% lambda light chain restricted B-cell population that is positive for CD103 and CD25. 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 So far, the findings do not support the diagnosis of hairy cell leukemia variant; however, if hairy cell leukemia variant is considered as an alternative, molecular diagnostic test for BRAF [id number] mutation could be performed to rule out the possibility and confirm the diagnosis of hairy cell leukemia. This case was reviewed in our hematopathology QA conference, and a consensus was reached. We appreciate your sending this interesting case in consultation and entirely agree with your assessment and diagnosis of this case.   [facility] pathology opinion was discussed with patient in detail.     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 male who has above history reviewed by me today presents for follow up visit for management of hairy cell leukemia,  Patient reports doing well.  He works at Geophysicist/field seismologist for [employer]. Denies any fever, chills, cough, shortness of breath, or mouth sore. Denies any abdominal pain or diarrhea. No new complaints. Appetite is good. Has gained weight .  Review of Systems  Constitutional: Negative for appetite change, chills, fatigue, fever and unexpected weight change.  HENT:   Negative for hearing loss and voice change.  Eyes: Negative for eye problems and icterus.  Respiratory: Negative for chest  tightness, cough and shortness of breath.   Cardiovascular: Negative for chest pain and leg swelling.  Gastrointestinal: Negative for abdominal distention, abdominal pain, blood in stool, constipation and nausea.  Endocrine: Negative for hot flashes.  Genitourinary: Negative for difficulty urinating, dysuria and frequency.   Musculoskeletal: Negative for arthralgias.  Skin: Negative for itching and rash.  Neurological: Negative for extremity weakness, light-headedness and numbness.  Hematological: Negative for adenopathy. Does not bruise/bleed easily.  Psychiatric/Behavioral: Negative for confusion.    MEDICAL HISTORY:  Past Medical History:  Diagnosis Date  . Diabetes mellitus without complication ([place])   . Hairy cell leukemia ([place]) 06/03/2018  . Hypertension     SURGICAL HISTORY: Past Surgical History:  Procedure Laterality Date  . COLON SURGERY      SOCIAL HISTORY: Social History   Socioeconomic History  . Marital status: Single    Spouse name: Not on file  . Number of children: Not on file  . Years of education: Not on file  . Highest education level: Not on file  Occupational History  . Not on file  Social Needs  . Financial resource strain: Not on file  . Food insecurity    Worry: Not on file    Inability: Not on file  . Transportation needs    Medical: Not on file    Non-medical: Not on file  Tobacco Use  . Smoking status: Current Every Day Smoker    Packs/day: 1.00    Years: 30.00    Pack years: 30.00  . Smokeless tobacco: Never Used  Substance and Sexual Activity  . Alcohol use: Never    Frequency: Never  . Drug use: Yes    Types: Marijuana    Comment: smoked marijuana in college  . Sexual activity: Not on file  Lifestyle  . Physical activity    Days per week: Not on file    Minutes per session: Not on file  . Stress: Not on file  Relationships  . Social Herbalist on phone: Not on file    Gets together: Not on file    Attends  religious service: Not on file    Active member of club or organization: Not on file    Attends meetings of clubs or organizations: Not on file    Relationship status: Not on file  . Intimate partner violence    Fear of current or ex partner: Not on file    Emotionally abused: Not on file    Physically abused: Not on file    Forced sexual activity: Not on file  Other Topics Concern  . Not on file  Social History Narrative  . Not on file    FAMILY HISTORY: Family History  Problem Relation Age of Onset  . Thyroid disease Mother   . Alzheimer's disease Mother     ALLERGIES:  has No Known Allergies.  MEDICATIONS:  Current Outpatient Medications  Medication Sig Dispense Refill  . buPROPion (WELLBUTRIN XL) 150 MG 24 hr tablet Take 150 mg by mouth daily.    Marland Kitchen lisinopril (PRINIVIL,ZESTRIL) 5 MG tablet Take 5 mg by mouth daily.    . metoprolol succinate (TOPROL-XL) 25 MG 24 hr tablet Take 1 tablet by mouth daily.    . prochlorperazine (COMPAZINE) 10 MG tablet Take 1 tablet (10 mg total) by mouth every 6 (six) hours as needed for nausea or vomiting. 30 tablet 0  .  Semaglutide (RYBELSUS) 7 MG TABS Take 7 mg by mouth daily.    . Vitamin D, Ergocalciferol, (DRISDOL) 1.25 MG (50000 UT) CAPS capsule Take 50,000 Units by mouth once a week.     No current facility-administered medications for this visit.      PHYSICAL EXAMINATION: ECOG PERFORMANCE STATUS: 1 - Symptomatic but completely ambulatory Vitals:   12/15/18 1325  BP: 129/84  Pulse: 88  Resp: 18  Temp: (!) 96.4 F (35.8 C)  SpO2: 94%   Filed Weights   12/15/18 1325  Weight: 179 lb 8 oz (81.4 kg)    Physical Exam Constitutional:      General: He is not in acute distress. HENT:     Head: Normocephalic and atraumatic.     Mouth/Throat:     Comments: Thrush  Eyes:     General: No scleral icterus.    Pupils: Pupils are equal, round, and reactive to light.  Neck:     Musculoskeletal: Normal range of motion and neck  supple.  Cardiovascular:     Rate and Rhythm: Normal rate and regular rhythm.     Heart sounds: Normal heart sounds.  Pulmonary:     Effort: Pulmonary effort is normal. No respiratory distress.     Breath sounds: No wheezing.  Abdominal:     General: Bowel sounds are normal. There is no distension.     Palpations: Abdomen is soft. There is no mass.     Tenderness: There is no abdominal tenderness.  Musculoskeletal: Normal range of motion.        General: No deformity.  Skin:    General: Skin is warm and dry.     Coloration: Skin is not pale.     Findings: No erythema or rash.  Neurological:     Mental Status: He is alert and oriented to person, place, and time.     Cranial Nerves: No cranial nerve deficit.     Coordination: Coordination normal.  Psychiatric:        Behavior: Behavior normal.        Thought Content: Thought content normal.     RADIOGRAPHIC STUDIES: I have personally reviewed the radiological images as listed and agreed with the findings in the report.  CMP Latest Ref Rng & Units 12/15/2018  Glucose 70 - 99 mg/dL 211(H)  BUN 6 - 20 mg/dL 14  Creatinine 0.61 - 1.24 mg/dL 0.93  Sodium 135 - 145 mmol/L 136  Potassium 3.5 - 5.1 mmol/L 4.0  Chloride 98 - 111 mmol/L 99  CO2 22 - 32 mmol/L 30  Calcium 8.9 - 10.3 mg/dL 9.8  Total Protein 6.5 - 8.1 g/dL 7.8  Total Bilirubin 0.3 - 1.2 mg/dL 0.5  Alkaline Phos 38 - 126 U/L 64  AST 15 - 41 U/L 19  ALT 0 - 44 U/L 22   CBC Latest Ref Rng & Units 12/15/2018  WBC 4.0 - 10.5 K/uL 7.4  Hemoglobin 13.0 - 17.0 g/dL 16.9  Hematocrit 39.0 - 52.0 % 51.5  Platelets 150 - 400 K/uL 213    LABORATORY DATA:  I have reviewed the data as listed Lab Results  Component Value Date   WBC 7.4 12/15/2018   HGB 16.9 12/15/2018   HCT 51.5 12/15/2018   MCV 88.9 12/15/2018   PLT 213 12/15/2018   Recent Labs    08/12/18 1337 09/12/18 0817 12/15/18 1308  NA 140 139 136  K 4.4 4.1 4.0  CL 106 106 99  CO2 24 25 30  GLUCOSE  104* 152* 211*  BUN _0 CREATININE 1.16 0.94 0.93  CALCIUM 9.9 9.6 9.8  GFRNONAA >60 >60 >60  GFRAA >60 >60 >60  PROT 8.3* 7.4 7.8  ALBUMIN 4.7 4.1 4.3  AST _1 ALT _2 ALKPHOS 54 65 64  BILITOT 0.6 0.4 0.5   Iron/TIBC/Ferritin/ %Sat    Component Value Date/Time   IRON 158 05/07/2018 1558   TIBC 403 05/07/2018 1558   FERRITIN 634 (H) 05/07/2018 1558   IRONPCTSAT 39 05/07/2018 1558     05/07/2018 peripheral blood flow cytometry showed CD103+, CD11c+, CD 25+, clonal B-cell population detected, representing 3% of the leukocytes, phenotype consistent with hairy cell leukemia.  05/12/2018 bone marrow biopsy Diagnosis Bone Marrow, Aspirate,Biopsy, and Clot, right iliac - HYPERCELLULAR BONE MARROW FOR AGE WITH INVOLVEMENT BY A B-CELL LYMPHOPROLIFERATIVE DISORDER. - SEE COMMENT. PERIPHERAL BLOOD: - PANCYTOPENIA. Diagnosis Note The overall material is suboptimal with lack of aspirate. Limited touch imprints and a core biopsy show extensive involvement by a B-cell lymphoproliferative process of primarily small to medium sized lymphocytes with a predominant interstitial pattern of involvement. Flow cytometric analysis was attempted but much of the material likely represents a peripheralized sample and is not considered entirely contributory. Immunohistochemical stains show that B-cells weakly express cyclin D1 but lack CD5, CD10, CD34, CD138, or TdT expression. 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 A repeat biopsy with fresh core biopsy material for flow cytometric study is strongly recommended for definite and accurate sub-classification of this process. (BNS:ecj 05/14/2018)   ASSESSMENT & PLAN:  1. Hairy cell leukemia not having achieved remission ([place])    #Labs are reviewed and discussed with patient. Hematological complete response.   Will obtain repeat bone marrow biopsy in the near future.  Patient prefers to do it next year after  he gets insurance.  Repeat blood work in 4 months.  Follow-up in the clinic. Marland Kitchen    [name], MD, PhD Hematology Oncology [facility] at [facility] Pager- [phone] 12/15/2018

## 2019-01-14 NOTE — Progress Notes (Signed)
Attempted to reach out to patient on several occasions after Magdalene Patricia, RN had survivorship visit.  I was unsuccessful in connecting with him.  We will continue to try.  Faythe Casa, NP 01/14/2019 2:18 PM

## 2019-04-13 ENCOUNTER — Other Ambulatory Visit: Payer: BLUE CROSS/BLUE SHIELD

## 2019-04-13 ENCOUNTER — Ambulatory Visit: Payer: BLUE CROSS/BLUE SHIELD | Admitting: Oncology

## 2019-04-20 ENCOUNTER — Other Ambulatory Visit: Payer: Self-pay

## 2019-04-20 ENCOUNTER — Inpatient Hospital Stay: Payer: PRIVATE HEALTH INSURANCE | Attending: Oncology

## 2019-04-20 DIAGNOSIS — D751 Secondary polycythemia: Secondary | ICD-10-CM | POA: Insufficient documentation

## 2019-04-20 DIAGNOSIS — C9141 Hairy cell leukemia, in remission: Secondary | ICD-10-CM | POA: Diagnosis present

## 2019-04-20 DIAGNOSIS — C9140 Hairy cell leukemia not having achieved remission: Secondary | ICD-10-CM

## 2019-04-20 DIAGNOSIS — F17210 Nicotine dependence, cigarettes, uncomplicated: Secondary | ICD-10-CM | POA: Insufficient documentation

## 2019-04-20 LAB — COMPREHENSIVE METABOLIC PANEL
ALT: 23 U/L (ref 0–44)
AST: 15 U/L (ref 15–41)
Albumin: 4.6 g/dL (ref 3.5–5.0)
Alkaline Phosphatase: 81 U/L (ref 38–126)
Anion gap: 8 (ref 5–15)
BUN: 10 mg/dL (ref 6–20)
CO2: 28 mmol/L (ref 22–32)
Calcium: 9.7 mg/dL (ref 8.9–10.3)
Chloride: 101 mmol/L (ref 98–111)
Creatinine, Ser: 0.95 mg/dL (ref 0.61–1.24)
GFR calc Af Amer: >60 mL/min (ref >60)
GFR calc non Af Amer: >60 mL/min (ref >60)
Glucose, Bld: 162 mg/dL — ABNORMAL HIGH (ref 70–99)
Potassium: 4.1 mmol/L (ref 3.5–5.1)
Sodium: 137 mmol/L (ref 135–145)
Total Bilirubin: 0.5 mg/dL (ref 0.3–1.2)
Total Protein: 8.2 g/dL — ABNORMAL HIGH (ref 6.5–8.1)

## 2019-04-20 LAB — CBC WITH DIFFERENTIAL/PLATELET
Abs Immature Granulocytes: 0.05 10*3/uL (ref 0.00–0.07)
Basophils Absolute: 0.1 10*3/uL (ref 0.0–0.1)
Basophils Relative: 1 %
Eosinophils Absolute: 0.2 10*3/uL (ref 0.0–0.5)
Eosinophils Relative: 3 %
HCT: 58.4 % — ABNORMAL HIGH (ref 39.0–52.0)
Hemoglobin: 18.4 g/dL — ABNORMAL HIGH (ref 13.0–17.0)
Immature Granulocytes: 1 %
Lymphocytes Relative: 20 %
Lymphs Abs: 1.6 10*3/uL (ref 0.7–4.0)
MCH: 29.2 pg (ref 26.0–34.0)
MCHC: 31.5 g/dL (ref 30.0–36.0)
MCV: 92.7 fL (ref 80.0–100.0)
Monocytes Absolute: 0.8 10*3/uL (ref 0.1–1.0)
Monocytes Relative: 10 %
Neutro Abs: 5.2 10*3/uL (ref 1.7–7.7)
Neutrophils Relative %: 65 %
Platelets: 200 10*3/uL (ref 150–400)
RBC: 6.3 MIL/uL — ABNORMAL HIGH (ref 4.22–5.81)
RDW: 14.7 % (ref 11.5–15.5)
WBC: 7.9 10*3/uL (ref 4.0–10.5)
nRBC: 0 % (ref 0.0–0.2)

## 2019-04-21 ENCOUNTER — Inpatient Hospital Stay (HOSPITAL_BASED_OUTPATIENT_CLINIC_OR_DEPARTMENT_OTHER): Payer: PRIVATE HEALTH INSURANCE | Admitting: Oncology

## 2019-04-21 ENCOUNTER — Encounter: Payer: Self-pay | Admitting: Oncology

## 2019-04-21 DIAGNOSIS — D751 Secondary polycythemia: Secondary | ICD-10-CM

## 2019-04-21 DIAGNOSIS — C9141 Hairy cell leukemia, in remission: Secondary | ICD-10-CM | POA: Diagnosis not present

## 2019-04-21 DIAGNOSIS — Z720 Tobacco use: Secondary | ICD-10-CM | POA: Diagnosis not present

## 2019-04-21 NOTE — Progress Notes (Signed)
Patient verified using two identifiers for virtual visit via telephone today.  Patient does not offer any problems today.  

## 2019-04-21 NOTE — Progress Notes (Signed)
HEMATOLOGY-ONCOLOGY TeleHEALTH VISIT PROGRESS NOTE  I connected with Roy Valentine on 04/21/19 at  2:00 PM EST by video enabled telemedicine visit and verified that I am speaking with the correct person using two identifiers. I discussed the limitations, risks, security and privacy concerns of performing an evaluation and management service by telemedicine and the availability of in-person appointments. I also discussed with the patient that there may be a patient responsible charge related to this service. The patient expressed understanding and agreed to proceed.   Other persons participating in the visit and their role in the encounter:  None  Patient's location: Home  Provider's location: office Chief Complaint: Follow-up for hairy cell leukemia   INTERVAL HISTORY Roy Valentine is a 49 y.o. male who has above history reviewed by me today presents for follow up visit for management of hairy cell leukemia Problems and complaints are listed below:  Patient reports doing well today.  He denies any fever, chills, unintentional weight loss, night sweats. Patient smokes a pack of cigarettes daily. Denies any shortness of breath. Appetite is good  Review of Systems  Constitutional: Negative for appetite change, chills, fatigue, fever and unexpected weight change.  HENT:   Negative for hearing loss and voice change.   Eyes: Negative for eye problems and icterus.  Respiratory: Negative for chest tightness, cough and shortness of breath.   Cardiovascular: Negative for chest pain and leg swelling.  Gastrointestinal: Negative for abdominal distention, abdominal pain and blood in stool.  Endocrine: Negative for hot flashes.  Genitourinary: Negative for difficulty urinating, dysuria and frequency.   Musculoskeletal: Negative for arthralgias.  Skin: Negative for itching and rash.  Neurological: Negative for extremity weakness, light-headedness and numbness.  Hematological: Negative  for adenopathy. Does not bruise/bleed easily.  Psychiatric/Behavioral: Negative for confusion.    Past Medical History:  Diagnosis Date  . Diabetes mellitus without complication ([place])   . Hairy cell leukemia ([place]) 06/03/2018  . Hypertension    Past Surgical History:  Procedure Laterality Date  . COLON SURGERY      Family History  Problem Relation Age of Onset  . Thyroid disease Mother   . Alzheimer's disease Mother     Social History   Socioeconomic History  . Marital status: Single    Spouse name: Not on file  . Number of children: Not on file  . Years of education: Not on file  . Highest education level: Not on file  Occupational History  . Not on file  Tobacco Use  . Smoking status: Current Every Day Smoker    Packs/day: 1.00    Years: 30.00    Pack years: 30.00  . Smokeless tobacco: Never Used  Substance and Sexual Activity  . Alcohol use: Never  . Drug use: Yes    Types: Marijuana    Comment: smoked marijuana in college  . Sexual activity: Not on file  Other Topics Concern  . Not on file  Social History Narrative  . Not on file   Social Determinants of Health   Financial Resource Strain:   . Difficulty of Paying Living Expenses: Not on file  Food Insecurity:   . Worried About Charity fundraiser in the Last Year: Not on file  . Ran Out of Food in the Last Year: Not on file  Transportation Needs:   . Lack of Transportation (Medical): Not on file  . Lack of Transportation (Non-Medical): Not on file  Physical Activity:   . Days of Exercise  per Week: Not on file  . Minutes of Exercise per Session: Not on file  Stress:   . Feeling of Stress : Not on file  Social Connections:   . Frequency of Communication with Friends and Family: Not on file  . Frequency of Social Gatherings with Friends and Family: Not on file  . Attends Religious Services: Not on file  . Active Member of Clubs or Organizations: Not on file  . Attends Archivist Meetings: Not  on file  . Marital Status: Not on file  Intimate Partner Violence:   . Fear of Current or Ex-Partner: Not on file  . Emotionally Abused: Not on file  . Physically Abused: Not on file  . Sexually Abused: Not on file    Current Outpatient Medications on File Prior to Visit  Medication Sig Dispense Refill  . buPROPion (WELLBUTRIN XL) 150 MG 24 hr tablet Take 150 mg by mouth daily.    Marland Kitchen icosapent Ethyl (VASCEPA) 1 g capsule Take 2 g by mouth 2 (two) times daily.    Marland Kitchen lisinopril (ZESTRIL) 10 MG tablet Take 10 mg by mouth daily.     . metoprolol succinate (TOPROL-XL) 25 MG 24 hr tablet Take 1 tablet by mouth daily.    . Semaglutide (RYBELSUS) 7 MG TABS Take 7 mg by mouth daily.    . Vitamin D, Ergocalciferol, (DRISDOL) 1.25 MG (50000 UT) CAPS capsule Take 50,000 Units by mouth once a week.    . prochlorperazine (COMPAZINE) 10 MG tablet Take 1 tablet (10 mg total) by mouth every 6 (six) hours as needed for nausea or vomiting. (Patient not taking: Reported on 04/21/2019) 30 tablet 0  . rosuvastatin (CRESTOR) 10 MG tablet      No current facility-administered medications on file prior to visit.    No Known Allergies     Observations/Objective: Today's Vitals   04/21/19 1327  PainSc: 0-No pain   There is no height or weight on file to calculate BMI.  Physical Exam  Constitutional: No distress.  Neurological: He is alert.    CBC    Component Value Date/Time   WBC 7.9 04/20/2019 1101   RBC 6.30 (H) 04/20/2019 1101   HGB 18.4 (H) 04/20/2019 1101   HGB 15.9 07/12/2014 0501   HCT 58.4 (H) 04/20/2019 1101   HCT 47.4 07/12/2014 0501   PLT 200 04/20/2019 1101   PLT 230 07/12/2014 0501   MCV 92.7 04/20/2019 1101   MCV 89 07/12/2014 0501   MCH 29.2 04/20/2019 1101   MCHC 31.5 04/20/2019 1101   RDW 14.7 04/20/2019 1101   RDW 15.0 (H) 07/12/2014 0501   LYMPHSABS 1.6 04/20/2019 1101   LYMPHSABS 1.7 07/12/2014 0501   MONOABS 0.8 04/20/2019 1101   MONOABS 1.7 (H) 07/12/2014 0501   EOSABS  0.2 04/20/2019 1101   EOSABS 0.4 07/12/2014 0501   BASOSABS 0.1 04/20/2019 1101   BASOSABS 0.1 07/12/2014 0501    CMP     Component Value Date/Time   NA 137 04/20/2019 1101   NA 134 (L) 07/16/2014 0523   K 4.1 04/20/2019 1101   K 4.2 07/16/2014 0523   CL 101 04/20/2019 1101   CL 97 (L) 07/16/2014 0523   CO2 28 04/20/2019 1101   CO2 29 07/16/2014 0523   GLUCOSE 162 (H) 04/20/2019 1101   GLUCOSE 98 07/16/2014 0523   BUN 10 04/20/2019 1101   BUN 15 07/16/2014 0523   CREATININE 0.95 04/20/2019 1101   CREATININE 0.76 07/16/2014 0523  CALCIUM 9.7 04/20/2019 1101   CALCIUM 8.9 07/16/2014 0523   PROT 8.2 (H) 04/20/2019 1101   PROT 6.1 (L) 07/08/2014 0437   ALBUMIN 4.6 04/20/2019 1101   ALBUMIN 2.4 (L) 07/08/2014 0437   AST 15 04/20/2019 1101   AST 14 (L) 07/08/2014 0437   ALT 23 04/20/2019 1101   ALT 26 07/04/2014 1844   ALKPHOS 81 04/20/2019 1101   ALKPHOS 63 07/08/2014 0437   BILITOT 0.5 04/20/2019 1101   BILITOT 0.5 07/04/2014 1844   GFRNONAA >60 04/20/2019 1101   GFRNONAA >60 07/16/2014 0523   GFRAA >60 04/20/2019 1101   GFRAA >60 07/16/2014 0523     Assessment and Plan: 1. Hairy cell leukemia, in remission ([place])   2. Erythrocytosis   3. Tobacco abuse     Hairy cell leukemia, clinically in remission. Patient prefers to defer bone marrow biopsy to sometime this year.  He is clinically doing well.  Erythrocytosis, etiology unknown.  Likely secondary due to smoking or other etiologies.  I will repeat CBC later this week, to confirm that hemoglobin is high.  I recommend phlebotomy 500 cc if hematocrit above 50. Patient agrees with the plan.  I will also check erythropoietin level, carbon monoxide level and JAK2 mutation with reflex.  Smoke cessation was discussed in details with patient.  He is motivated.   Follow Up Instructions: To be determined.   I discussed the assessment and treatment plan with the patient. The patient was provided an opportunity to ask  questions and all were answered. The patient agreed with the plan and demonstrated an understanding of the instructions.  The patient was advised to call back or seek an in-person evaluation if the symptoms worsen or if the condition fails to improve as anticipated.    [name], MD 04/21/2019 3:59 PM

## 2019-04-24 ENCOUNTER — Inpatient Hospital Stay: Payer: PRIVATE HEALTH INSURANCE

## 2019-04-24 ENCOUNTER — Other Ambulatory Visit: Payer: Self-pay

## 2019-04-24 VITALS — BP 113/66 | HR 92 | Temp 97.7°F | Resp 18

## 2019-04-24 DIAGNOSIS — C9141 Hairy cell leukemia, in remission: Secondary | ICD-10-CM

## 2019-04-24 DIAGNOSIS — D751 Secondary polycythemia: Secondary | ICD-10-CM

## 2019-04-24 LAB — CBC WITH DIFFERENTIAL/PLATELET
Abs Immature Granulocytes: 0.08 10*3/uL — ABNORMAL HIGH (ref 0.00–0.07)
Basophils Absolute: 0.1 10*3/uL (ref 0.0–0.1)
Basophils Relative: 1 %
Eosinophils Absolute: 0.2 10*3/uL (ref 0.0–0.5)
Eosinophils Relative: 2 %
HCT: 54.8 % — ABNORMAL HIGH (ref 39.0–52.0)
Hemoglobin: 17.4 g/dL — ABNORMAL HIGH (ref 13.0–17.0)
Immature Granulocytes: 1 %
Lymphocytes Relative: 21 %
Lymphs Abs: 2 10*3/uL (ref 0.7–4.0)
MCH: 29.6 pg (ref 26.0–34.0)
MCHC: 31.8 g/dL (ref 30.0–36.0)
MCV: 93.2 fL (ref 80.0–100.0)
Monocytes Absolute: 0.9 10*3/uL (ref 0.1–1.0)
Monocytes Relative: 9 %
Neutro Abs: 6.4 10*3/uL (ref 1.7–7.7)
Neutrophils Relative %: 66 %
Platelets: 214 10*3/uL (ref 150–400)
RBC: 5.88 MIL/uL — ABNORMAL HIGH (ref 4.22–5.81)
RDW: 14.7 % (ref 11.5–15.5)
WBC: 9.7 10*3/uL (ref 4.0–10.5)
nRBC: 0 % (ref 0.0–0.2)

## 2019-04-24 NOTE — Progress Notes (Signed)
Current HCT 54.8, Per MD order 500 cc phlebotomy to be performed if HCT over 50. Therapeutic phlebotomy performed per MD order removing 500 cc using 20 gauge PIV in left AC. Pt Declines snack or drink. Pt tolerated procedure well. Pt denies any complaints or concerns. No s/s of distress noted. Pt and VS stable at discharge.

## 2019-04-25 LAB — ERYTHROPOIETIN: Erythropoietin: 15.3 m[IU]/mL (ref 2.6–18.5)

## 2019-04-27 ENCOUNTER — Other Ambulatory Visit: Payer: Self-pay

## 2019-04-27 ENCOUNTER — Other Ambulatory Visit: Payer: Self-pay | Admitting: Oncology

## 2019-04-27 ENCOUNTER — Telehealth: Payer: Self-pay

## 2019-04-27 DIAGNOSIS — D751 Secondary polycythemia: Secondary | ICD-10-CM

## 2019-04-27 DIAGNOSIS — C9141 Hairy cell leukemia, in remission: Secondary | ICD-10-CM

## 2019-04-27 LAB — CARBON MONOXIDE, BLOOD (PERFORMED AT REF LAB): Carbon Monoxide, Blood: 10.7 % — ABNORMAL HIGH (ref 0.0–3.6)

## 2019-04-27 NOTE — Telephone Encounter (Signed)
-----   Message from [name], MD sent at 04/27/2019  8:32 AM EST ----- Please schedule pt to get 1 unit of phlebotomy this week.  Repeat H&H in 2 weeks  +/- Phlebotomy.  Follow up in 4 weeks. H&H virtual MD +/- Phlebotomy. Thanks.

## 2019-04-30 LAB — CALR + JAK2 E12-15 + MPL (REFLEXED)

## 2019-04-30 LAB — JAK2 V617F, W REFLEX TO CALR/E12/MPL

## 2019-05-01 ENCOUNTER — Other Ambulatory Visit: Payer: Self-pay

## 2019-05-01 ENCOUNTER — Inpatient Hospital Stay: Payer: PRIVATE HEALTH INSURANCE

## 2019-05-01 VITALS — BP 116/78 | HR 86 | Temp 96.3°F | Resp 18

## 2019-05-01 DIAGNOSIS — C9141 Hairy cell leukemia, in remission: Secondary | ICD-10-CM | POA: Diagnosis not present

## 2019-05-01 DIAGNOSIS — D751 Secondary polycythemia: Secondary | ICD-10-CM

## 2019-05-01 NOTE — Progress Notes (Signed)
1130: Per Dr Tasia Catchings, proceed with 500cc phlebotomy, no need for labs at this time.   Therapeutic phlebotomy preformed per MD order, removed 50cc using 20 gauge PIV in right AC, prior to IV clotting and unable to pull off anymore blood. 450cc removed using 20 gauge PIV in left AC. Snack and drink offered and pt declined. Pt tolerated infusion well. Pt and VS stable at discharge.

## 2019-05-15 ENCOUNTER — Inpatient Hospital Stay: Payer: PRIVATE HEALTH INSURANCE

## 2019-05-15 ENCOUNTER — Other Ambulatory Visit: Payer: Self-pay

## 2019-05-15 DIAGNOSIS — C9141 Hairy cell leukemia, in remission: Secondary | ICD-10-CM

## 2019-05-15 DIAGNOSIS — D751 Secondary polycythemia: Secondary | ICD-10-CM

## 2019-05-15 LAB — HEMATOCRIT: HCT: 50.4 % (ref 39.0–52.0)

## 2019-05-15 LAB — HEMOGLOBIN: Hemoglobin: 16 g/dL (ref 13.0–17.0)

## 2019-05-27 ENCOUNTER — Encounter: Payer: Self-pay | Admitting: Oncology

## 2019-05-27 ENCOUNTER — Inpatient Hospital Stay: Payer: PRIVATE HEALTH INSURANCE | Attending: Oncology

## 2019-05-27 ENCOUNTER — Inpatient Hospital Stay (HOSPITAL_BASED_OUTPATIENT_CLINIC_OR_DEPARTMENT_OTHER): Payer: PRIVATE HEALTH INSURANCE | Admitting: Oncology

## 2019-05-27 DIAGNOSIS — D751 Secondary polycythemia: Secondary | ICD-10-CM | POA: Diagnosis not present

## 2019-05-27 DIAGNOSIS — Z720 Tobacco use: Secondary | ICD-10-CM

## 2019-05-27 DIAGNOSIS — C9141 Hairy cell leukemia, in remission: Secondary | ICD-10-CM | POA: Diagnosis present

## 2019-05-27 LAB — HEMOGLOBIN: Hemoglobin: 15.8 g/dL (ref 13.0–17.0)

## 2019-05-27 LAB — HEMATOCRIT: HCT: 50.2 % (ref 39.0–52.0)

## 2019-05-27 NOTE — Progress Notes (Signed)
Patient verified using two identifiers for virtual visit via telephone today.  Patient does not offer any problems today.  

## 2019-05-27 NOTE — Progress Notes (Signed)
HEMATOLOGY-ONCOLOGY TeleHEALTH VISIT PROGRESS NOTE  I connected with Roy Valentine on 05/27/19 at  1:45 PM EST by video enabled telemedicine visit and verified that I am speaking with the correct person using two identifiers. I discussed the limitations, risks, security and privacy concerns of performing an evaluation and management service by telemedicine and the availability of in-person appointments. I also discussed with the patient that there may be a patient responsible charge related to this service. The patient expressed understanding and agreed to proceed.   Other persons participating in the visit and their role in the encounter:  None  Patient's location: Home  Provider's location: office Chief Complaint: Follow-up for hairy cell leukemia   INTERVAL HISTORY Roy Valentine is a 49 y.o. 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 Problems and complaints are listed below:  Patient has had therapeutic phlebotomy during interval.  Tolerates well. He continues to smoke daily, he has decreased to about 10 cigarettes/day.   Today he denies any new complaints.  Appetite is good.  Review of Systems  Constitutional: Positive for fatigue. Negative for appetite change, chills, fever and unexpected weight change.  HENT:   Negative for hearing loss and voice change.   Eyes: Negative for eye problems and icterus.  Respiratory: Negative for chest tightness, cough and shortness of breath.   Cardiovascular: Negative for chest pain and leg swelling.  Gastrointestinal: Negative for abdominal distention and abdominal pain.  Endocrine: Negative for hot flashes.  Genitourinary: Negative for difficulty urinating, dysuria and frequency.   Musculoskeletal: Negative for arthralgias.  Skin: Negative for itching and rash.  Neurological: Negative for light-headedness and numbness.   Hematological: Negative for adenopathy. Does not bruise/bleed easily.  Psychiatric/Behavioral: Negative for confusion.    Past Medical History:  Diagnosis Date  . Diabetes mellitus without complication ([place])   . Hairy cell leukemia ([place]) 06/03/2018  . Hypertension    Past Surgical History:  Procedure Laterality Date  . COLON SURGERY      Family History  Problem Relation Age of Onset  . Thyroid disease Mother   . Alzheimer's disease Mother     Social History   Socioeconomic History  . Marital status: Single    Spouse name: Not on file  . Number of children: Not on file  . Years of education: Not on file  . Highest education level: Not on file  Occupational History  . Not on file  Tobacco Use  . Smoking status: Current Every Day Smoker    Packs/day: 1.00    Years: 30.00    Pack years: 30.00  . Smokeless tobacco: Never Used  Substance and Sexual Activity  . Alcohol use: Never  . Drug use: Yes    Types: Marijuana    Comment: smoked marijuana in college  . Sexual activity: Not on file  Other Topics Concern  . Not on file  Social History Narrative  . Not on file   Social Determinants of Health   Financial Resource Strain:   . Difficulty of Paying Living Expenses: Not on file  Food Insecurity:   . Worried About Charity fundraiser in the Last Year: Not on file  . Ran Out of Food in the Last Year: Not on file  Transportation Needs:   . Lack of Transportation (Medical): Not on file  . Lack of Transportation (Non-Medical): Not on file  Physical Activity:   .  Days of Exercise per Week: Not on file  . Minutes of Exercise per Session: Not on file  Stress:   . Feeling of Stress : Not on file  Social Connections:   . Frequency of Communication with Friends and Family: Not on file  . Frequency of Social Gatherings with Friends and Family: Not on file  . Attends Religious Services: Not on file  . Active Member of Clubs or Organizations: Not on file  . Attends Theatre manager Meetings: Not on file  . Marital Status: Not on file  Intimate Partner Violence:   . Fear of Current or Ex-Partner: Not on file  . Emotionally Abused: Not on file  . Physically Abused: Not on file  . Sexually Abused: Not on file    Current Outpatient Medications on File Prior to Visit  Medication Sig Dispense Refill  . buPROPion (WELLBUTRIN XL) 150 MG 24 hr tablet Take 150 mg by mouth daily.    Marland Kitchen icosapent Ethyl (VASCEPA) 1 g capsule Take 2 g by mouth 2 (two) times daily.    Marland Kitchen lisinopril (ZESTRIL) 10 MG tablet Take 10 mg by mouth daily.     . metoprolol succinate (TOPROL-XL) 25 MG 24 hr tablet Take 1 tablet by mouth daily.    . rosuvastatin (CRESTOR) 10 MG tablet     . Semaglutide (RYBELSUS) 7 MG TABS Take 7 mg by mouth daily.    . Vitamin D, Ergocalciferol, (DRISDOL) 1.25 MG (50000 UT) CAPS capsule Take 50,000 Units by mouth once a week.    . prochlorperazine (COMPAZINE) 10 MG tablet Take 1 tablet (10 mg total) by mouth every 6 (six) hours as needed for nausea or vomiting. (Patient not taking: Reported on 04/21/2019) 30 tablet 0   No current facility-administered medications on file prior to visit.    No Known Allergies     Observations/Objective: Today's Vitals   05/27/19 1402  PainSc: 0-No pain   There is no height or weight on file to calculate BMI.  Physical Exam  Constitutional: No distress.  Neurological: He is alert.    CBC    Component Value Date/Time   WBC 9.7 04/24/2019 1411   RBC 5.88 (H) 04/24/2019 1411   HGB 15.8 05/27/2019 1051   HGB 15.9 07/12/2014 0501   HCT 50.2 05/27/2019 1051   HCT 47.4 07/12/2014 0501   PLT 214 04/24/2019 1411   PLT 230 07/12/2014 0501   MCV 93.2 04/24/2019 1411   MCV 89 07/12/2014 0501   MCH 29.6 04/24/2019 1411   MCHC 31.8 04/24/2019 1411   RDW 14.7 04/24/2019 1411   RDW 15.0 (H) 07/12/2014 0501   LYMPHSABS 2.0 04/24/2019 1411   LYMPHSABS 1.7 07/12/2014 0501   MONOABS 0.9 04/24/2019 1411   MONOABS 1.7 (H)  07/12/2014 0501   EOSABS 0.2 04/24/2019 1411   EOSABS 0.4 07/12/2014 0501   BASOSABS 0.1 04/24/2019 1411   BASOSABS 0.1 07/12/2014 0501    CMP     Component Value Date/Time   NA 137 04/20/2019 1101   NA 134 (L) 07/16/2014 0523   K 4.1 04/20/2019 1101   K 4.2 07/16/2014 0523   CL 101 04/20/2019 1101   CL 97 (L) 07/16/2014 0523   CO2 28 04/20/2019 1101   CO2 29 07/16/2014 0523   GLUCOSE 162 (H) 04/20/2019 1101   GLUCOSE 98 07/16/2014 0523   BUN 10 04/20/2019 1101   BUN 15 07/16/2014 0523   CREATININE 0.95 04/20/2019 1101   CREATININE 0.76 07/16/2014  1275   CALCIUM 9.7 04/20/2019 1101   CALCIUM 8.9 07/16/2014 0523   PROT 8.2 (H) 04/20/2019 1101   PROT 6.1 (L) 07/08/2014 0437   ALBUMIN 4.6 04/20/2019 1101   ALBUMIN 2.4 (L) 07/08/2014 0437   AST 15 04/20/2019 1101   AST 14 (L) 07/08/2014 0437   ALT 23 04/20/2019 1101   ALT 26 07/04/2014 1844   ALKPHOS 81 04/20/2019 1101   ALKPHOS 63 07/08/2014 0437   BILITOT 0.5 04/20/2019 1101   BILITOT 0.5 07/04/2014 1844   GFRNONAA >60 04/20/2019 1101   GFRNONAA >60 07/16/2014 0523   GFRAA >60 04/20/2019 1101   GFRAA >60 07/16/2014 0523     Assessment and Plan: 1. Hairy cell leukemia, in remission ([place])   2. Erythrocytosis     Hairy cell leukemia, clinically in remission. Patient prefers to defer bone marrow biopsy to sometime this year.  He is clinically doing well.  Secondary erythrocytosis, Lab work-up results were reviewed and discussed with patient. JAK2 V617F mutation negative, with reflex to other mutations CALR, MPL, JAK 2 Ex 12-15 mutations negative. Elevated carbon monoxide level. Smoke cessation was discussed again with patient. Patient has had therapeutic phlebotomy. Today his hematocrit is 50.2.  Patient is motivated about further decreasing cigarette use. I will hold off phlebotomy at this point. Patient will have H&H in 4 weeks and 8 weeks to monitor his blood counts.  Follow Up Instructions: 3 months   I  discussed the assessment and treatment plan with the patient. The patient was provided an opportunity to ask questions and all were answered. The patient agreed with the plan and demonstrated an understanding of the instructions.  The patient was advised to call back or seek an in-person evaluation if the symptoms worsen or if the condition fails to improve as anticipated.    [name], MD 05/27/2019 9:20 PM

## 2019-05-28 ENCOUNTER — Inpatient Hospital Stay: Payer: PRIVATE HEALTH INSURANCE

## 2019-06-24 ENCOUNTER — Inpatient Hospital Stay: Payer: PRIVATE HEALTH INSURANCE | Attending: Oncology

## 2019-06-24 ENCOUNTER — Other Ambulatory Visit: Payer: Self-pay

## 2019-06-24 DIAGNOSIS — C9141 Hairy cell leukemia, in remission: Secondary | ICD-10-CM | POA: Insufficient documentation

## 2019-06-24 DIAGNOSIS — D751 Secondary polycythemia: Secondary | ICD-10-CM | POA: Insufficient documentation

## 2019-06-24 LAB — HEMOGLOBIN AND HEMATOCRIT, BLOOD
HCT: 50.2 % (ref 39.0–52.0)
Hemoglobin: 16.5 g/dL (ref 13.0–17.0)

## 2019-07-22 ENCOUNTER — Inpatient Hospital Stay: Payer: PRIVATE HEALTH INSURANCE | Attending: Oncology

## 2019-07-22 ENCOUNTER — Other Ambulatory Visit: Payer: Self-pay

## 2019-07-22 DIAGNOSIS — D751 Secondary polycythemia: Secondary | ICD-10-CM | POA: Insufficient documentation

## 2019-07-22 DIAGNOSIS — C9141 Hairy cell leukemia, in remission: Secondary | ICD-10-CM | POA: Insufficient documentation

## 2019-07-22 LAB — HEMOGLOBIN AND HEMATOCRIT, BLOOD
HCT: 51 % (ref 39.0–52.0)
Hemoglobin: 16.7 g/dL (ref 13.0–17.0)

## 2019-08-28 ENCOUNTER — Inpatient Hospital Stay: Payer: PRIVATE HEALTH INSURANCE | Attending: Oncology

## 2019-08-28 ENCOUNTER — Inpatient Hospital Stay (HOSPITAL_BASED_OUTPATIENT_CLINIC_OR_DEPARTMENT_OTHER): Payer: PRIVATE HEALTH INSURANCE | Admitting: Oncology

## 2019-08-28 ENCOUNTER — Other Ambulatory Visit: Payer: Self-pay

## 2019-08-28 ENCOUNTER — Inpatient Hospital Stay: Payer: PRIVATE HEALTH INSURANCE

## 2019-08-28 VITALS — BP 117/79 | HR 88 | Temp 96.7°F | Resp 18 | Wt 180.5 lb

## 2019-08-28 DIAGNOSIS — Z720 Tobacco use: Secondary | ICD-10-CM

## 2019-08-28 DIAGNOSIS — F17210 Nicotine dependence, cigarettes, uncomplicated: Secondary | ICD-10-CM | POA: Insufficient documentation

## 2019-08-28 DIAGNOSIS — C9141 Hairy cell leukemia, in remission: Secondary | ICD-10-CM | POA: Insufficient documentation

## 2019-08-28 DIAGNOSIS — D751 Secondary polycythemia: Secondary | ICD-10-CM | POA: Diagnosis not present

## 2019-08-28 LAB — CBC WITH DIFFERENTIAL/PLATELET
Abs Immature Granulocytes: 0.05 10*3/uL (ref 0.00–0.07)
Basophils Absolute: 0.1 10*3/uL (ref 0.0–0.1)
Basophils Relative: 1 %
Eosinophils Absolute: 0.2 10*3/uL (ref 0.0–0.5)
Eosinophils Relative: 3 %
HCT: 49.6 % (ref 39.0–52.0)
Hemoglobin: 16.1 g/dL (ref 13.0–17.0)
Immature Granulocytes: 1 %
Lymphocytes Relative: 20 %
Lymphs Abs: 1.9 10*3/uL (ref 0.7–4.0)
MCH: 28.5 pg (ref 26.0–34.0)
MCHC: 32.5 g/dL (ref 30.0–36.0)
MCV: 87.8 fL (ref 80.0–100.0)
Monocytes Absolute: 0.7 10*3/uL (ref 0.1–1.0)
Monocytes Relative: 8 %
Neutro Abs: 6.5 10*3/uL (ref 1.7–7.7)
Neutrophils Relative %: 67 %
Platelets: 217 10*3/uL (ref 150–400)
RBC: 5.65 MIL/uL (ref 4.22–5.81)
RDW: 15.1 % (ref 11.5–15.5)
WBC: 9.4 10*3/uL (ref 4.0–10.5)
nRBC: 0 % (ref 0.0–0.2)

## 2019-08-28 LAB — COMPREHENSIVE METABOLIC PANEL
ALT: 26 U/L (ref 0–44)
AST: 19 U/L (ref 15–41)
Albumin: 4.1 g/dL (ref 3.5–5.0)
Alkaline Phosphatase: 66 U/L (ref 38–126)
Anion gap: 8 (ref 5–15)
BUN: 11 mg/dL (ref 6–20)
CO2: 28 mmol/L (ref 22–32)
Calcium: 9.1 mg/dL (ref 8.9–10.3)
Chloride: 100 mmol/L (ref 98–111)
Creatinine, Ser: 0.96 mg/dL (ref 0.61–1.24)
GFR calc Af Amer: >60 mL/min (ref >60)
GFR calc non Af Amer: >60 mL/min (ref >60)
Glucose, Bld: 116 mg/dL — ABNORMAL HIGH (ref 70–99)
Potassium: 4.1 mmol/L (ref 3.5–5.1)
Sodium: 136 mmol/L (ref 135–145)
Total Bilirubin: 0.5 mg/dL (ref 0.3–1.2)
Total Protein: 7.6 g/dL (ref 6.5–8.1)

## 2019-08-28 NOTE — Progress Notes (Signed)
Patient denies new problems/concerns today.   °

## 2019-08-29 ENCOUNTER — Encounter: Payer: Self-pay | Admitting: Oncology

## 2019-08-29 NOTE — Progress Notes (Signed)
Hematology/Oncology follow up note [facility] [phone] [phone]   Patient Care Team: Perrin Maltese, MD as PCP - General (Internal Medicine) [name], MD as Consulting Physician (Oncology)  REFERRING PROVIDER: Dr.Khan. REASON FOR VISIT:  Follow up for management of pancytopenia, hairy cell leukemia  HISTORY OF PRESENTING ILLNESS:  Roy Valentine is a  49 y.o.  male with PMH listed below who was referred to me for evaluation of pancytopenia.  Patient recently follows up with pcp and had lab work up done.  Cbc showed hemoglobin of 7, leukopenia with ANC 0.1, thrombocytopenia with platelet count   Fatigue: reports worsening fatigue. Chronic onset, perisistent, for the past few months. no aggravating or improving factors, no associated symptoms.  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    He has ED visit on 08/12/2017 due to cough and fever. CXR showed pneumonia.  Cbc on 08/12/2017 showed wbc 1.7, hemoglobin 16, platelet 247,000.  No differential was checked at that time. Patient was given a course of antibiotics and per patient his symptoms improved.   Denies hematochezia, hematuria, hematemesis, epistaxis, black tarry stool or easy bruising.  Denies fever or chills, any recent frequent infections.  He has lost about 10 pounds since May 2019. Appetite is fair.   # Bone marrow biopsy was sent to [facility] for second opinion.  Comments as below.  A. Peripheral blood and bone marrow biopsy; Outside consult, [id number], [facility], [place], [place]. Date of procedure 05-12-18: Small mature B-cell neoplasm. 70% leukemic infiltrate in a 75% cellular bone marrow. Markedly decreased hematopoiesis with focal moderate reticulin fibrosis. Moderate to marked pancytopenia with monocytopenia. See comment.  Comment: The neoplastic involvement in the bone marrow assumes interstitial infiltrate with aggregates of small  mature lymphocytes containing moderate amount of cytoplasm. Abnormal lymphocytes with hairy-like projects are occasionally seen on the aspirate smear, even though the aspirate is apparently hemodilute. Per the outside report, flow cytometric analysis demonstrates 3% lambda light chain restricted B-cell population that is positive for CD103 and CD25. 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 So far, the findings do not support the diagnosis of hairy cell leukemia variant; however, if hairy cell leukemia variant is considered as an alternative, molecular diagnostic test for BRAF [id number] mutation could be performed to rule out the possibility and confirm the diagnosis of hairy cell leukemia. This case was reviewed in our hematopathology QA conference, and a consensus was reached. We appreciate your sending this interesting case in consultation and entirely agree with your assessment and diagnosis of this case.   [facility] opinion was discussed with patient in detail.     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 male who has above history reviewed by me today presents for follow up visit for management of hairy cell leukemia,  He reports feeling well and has no new complaints. He has cut down smoking to 6-7 cigarets per day.  .Denies weight loss, fever, chills, fatigue, night sweats.    Review of Systems  Constitutional: Negative for appetite change, chills, fatigue, fever and unexpected weight change.  HENT:   Negative for hearing loss and voice change.   Eyes: Negative for eye problems and icterus.  Respiratory:  Negative for chest tightness, cough and shortness of breath.   Cardiovascular: Negative for chest pain  and leg swelling.  Gastrointestinal: Negative for abdominal distention, abdominal pain, blood in stool, constipation and nausea.  Endocrine: Negative for hot flashes.  Genitourinary: Negative for difficulty urinating, dysuria and frequency.   Musculoskeletal: Negative for arthralgias.  Skin: Negative for itching and rash.  Neurological: Negative for extremity weakness, light-headedness and numbness.  Hematological: Negative for adenopathy. Does not bruise/bleed easily.  Psychiatric/Behavioral: Negative for confusion.    MEDICAL HISTORY:  Past Medical History:  Diagnosis Date  . Diabetes mellitus without complication ([place])   . Hairy cell leukemia ([place]) 06/03/2018  . Hypertension     SURGICAL HISTORY: Past Surgical History:  Procedure Laterality Date  . COLON SURGERY      SOCIAL HISTORY: Social History   Socioeconomic History  . Marital status: Single    Spouse name: Not on file  . Number of children: Not on file  . Years of education: Not on file  . Highest education level: Not on file  Occupational History  . Not on file  Tobacco Use  . Smoking status: Current Every Day Smoker    Packs/day: 1.00    Years: 30.00    Pack years: 30.00  . Smokeless tobacco: Never Used  Vaping Use  . Vaping Use: Never used  Substance and Sexual Activity  . Alcohol use: Never  . Drug use: Yes    Types: Marijuana    Comment: smoked marijuana in college  . Sexual activity: Not on file  Other Topics Concern  . Not on file  Social History Narrative  . Not on file   Social Determinants of Health   Financial Resource Strain:   . Difficulty of Paying Living Expenses:   Food Insecurity:   . Worried About Charity fundraiser in the Last Year:   . Arboriculturist in the Last Year:   Transportation Needs:   . Film/video editor (Medical):   Marland Kitchen Lack of Transportation (Non-Medical):   Physical Activity:   . Days of Exercise per Week:   . Minutes of Exercise per Session:   Stress:    . Feeling of Stress :   Social Connections:   . Frequency of Communication with Friends and Family:   . Frequency of Social Gatherings with Friends and Family:   . Attends Religious Services:   . Active Member of Clubs or Organizations:   . Attends Archivist Meetings:   Marland Kitchen Marital Status:   Intimate Partner Violence:   . Fear of Current or Ex-Partner:   . Emotionally Abused:   Marland Kitchen Physically Abused:   . Sexually Abused:     FAMILY HISTORY: Family History  Problem Relation Age of Onset  . Thyroid disease Mother   . Alzheimer's disease Mother     ALLERGIES:  has No Known Allergies.  MEDICATIONS:  Current Outpatient Medications  Medication Sig Dispense Refill  . buPROPion (WELLBUTRIN XL) 150 MG 24 hr tablet Take 150 mg by mouth daily.    Marland Kitchen lisinopril (ZESTRIL) 10 MG tablet Take 10 mg by mouth daily.     . metoprolol succinate (TOPROL-XL) 25 MG 24 hr tablet Take 1 tablet by mouth daily.    . rosuvastatin (CRESTOR) 10 MG tablet     . Semaglutide (RYBELSUS) 7 MG TABS Take 7 mg by mouth daily.    . Vitamin D, Ergocalciferol, (DRISDOL) 1.25 MG (50000 UT) CAPS capsule Take 50,000 Units  by mouth once a week.    . icosapent Ethyl (VASCEPA) 1 g capsule Take 2 g by mouth 2 (two) times daily. (Patient not taking: Reported on 08/28/2019)    . prochlorperazine (COMPAZINE) 10 MG tablet Take 1 tablet (10 mg total) by mouth every 6 (six) hours as needed for nausea or vomiting. (Patient not taking: Reported on 04/21/2019) 30 tablet 0   No current facility-administered medications for this visit.     PHYSICAL EXAMINATION: ECOG PERFORMANCE STATUS: 1 - Symptomatic but completely ambulatory Vitals:   08/28/19 1335  BP: 117/79  Pulse: 88  Resp: 18  Temp: (!) 96.7 F (35.9 C)   Filed Weights   08/28/19 1335  Weight: 180 lb 8 oz (81.9 kg)    Physical Exam Constitutional:      General: He is not in acute distress. HENT:     Head: Normocephalic and atraumatic.     Mouth/Throat:      Comments: Thrush  Eyes:     General: No scleral icterus.    Pupils: Pupils are equal, round, and reactive to light.  Cardiovascular:     Rate and Rhythm: Normal rate and regular rhythm.     Heart sounds: Normal heart sounds.  Pulmonary:     Effort: Pulmonary effort is normal. No respiratory distress.     Breath sounds: No wheezing.  Abdominal:     General: Bowel sounds are normal. There is no distension.     Palpations: Abdomen is soft. There is no mass.     Tenderness: There is no abdominal tenderness.  Musculoskeletal:        General: No deformity. Normal range of motion.     Cervical back: Normal range of motion and neck supple.  Skin:    General: Skin is warm and dry.     Coloration: Skin is not pale.     Findings: No erythema or rash.  Neurological:     Mental Status: He is alert and oriented to person, place, and time. Mental status is at baseline.     Cranial Nerves: No cranial nerve deficit.     Coordination: Coordination normal.  Psychiatric:        Mood and Affect: Mood normal.        Behavior: Behavior normal.        Thought Content: Thought content normal.     RADIOGRAPHIC STUDIES: I have personally reviewed the radiological images as listed and agreed with the findings in the report.  CMP Latest Ref Rng & Units 08/28/2019  Glucose 70 - 99 mg/dL 116(H)  BUN 6 - 20 mg/dL 11  Creatinine 0.61 - 1.24 mg/dL 0.96  Sodium 135 - 145 mmol/L 136  Potassium 3.5 - 5.1 mmol/L 4.1  Chloride 98 - 111 mmol/L 100  CO2 22 - 32 mmol/L 28  Calcium 8.9 - 10.3 mg/dL 9.1  Total Protein 6.5 - 8.1 g/dL 7.6  Total Bilirubin 0.3 - 1.2 mg/dL 0.5  Alkaline Phos 38 - 126 U/L 66  AST 15 - 41 U/L 19  ALT 0 - 44 U/L 26   CBC Latest Ref Rng & Units 08/28/2019  WBC 4.0 - 10.5 K/uL 9.4  Hemoglobin 13.0 - 17.0 g/dL 16.1  Hematocrit 39 - 52 % 49.6  Platelets 150 - 400 K/uL 217    LABORATORY DATA:  I have reviewed the data as listed Lab Results  Component Value Date   WBC 9.4  08/28/2019   HGB 16.1 08/28/2019   HCT 49.6  08/28/2019   MCV 87.8 08/28/2019   PLT 217 08/28/2019   Recent Labs    12/15/18 1308 04/20/19 1101 08/28/19 1321  NA 136 137 136  K 4.0 4.1 4.1  CL 99 101 100  CO2 30 28 28   GLUCOSE 211* 162* 116*  BUN 14 10 11   CREATININE 0.93 0.95 0.96  CALCIUM 9.8 9.7 9.1  GFRNONAA >60 >60 >60  GFRAA >60 >60 >60  PROT 7.8 8.2* 7.6  ALBUMIN 4.3 4.6 4.1  AST 19 15 19   ALT 22 23 26   ALKPHOS 64 81 66  BILITOT 0.5 0.5 0.5   Iron/TIBC/Ferritin/ %Sat    Component Value Date/Time   IRON 158 05/07/2018 1558   TIBC 403 05/07/2018 1558   FERRITIN 634 (H) 05/07/2018 1558   IRONPCTSAT 39 05/07/2018 1558     05/07/2018 peripheral blood flow cytometry showed CD103+, CD11c+, CD 25+, clonal B-cell population detected, representing 3% of the leukocytes, phenotype consistent with hairy cell leukemia.  05/12/2018 bone marrow biopsy Diagnosis Bone Marrow, Aspirate,Biopsy, and Clot, right iliac - HYPERCELLULAR BONE MARROW FOR AGE WITH INVOLVEMENT BY A B-CELL LYMPHOPROLIFERATIVE DISORDER. - SEE COMMENT. PERIPHERAL BLOOD: - PANCYTOPENIA. Diagnosis Note The overall material is suboptimal with lack of aspirate. Limited touch imprints and a core biopsy show extensive involvement by a B-cell lymphoproliferative process of primarily small to medium sized lymphocytes with a predominant interstitial pattern of involvement. Flow cytometric analysis was attempted but much of the material likely represents a peripheralized sample and is not considered entirely contributory. Immunohistochemical stains show that B-cells weakly express cyclin D1 but lack CD5, CD10, CD34, CD138, or TdT expression. 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 A repeat biopsy with fresh core biopsy material for flow cytometric study is strongly recommended for definite and accurate sub-classification of this process. (BNS:ecj 05/14/2018)   ASSESSMENT & PLAN:  1.  Hairy cell leukemia, in remission ([place])   2. Secondary erythrocytosis   3. Tobacco abuse    Labs are reviewed and discussed with patient. Hairy cell leukemia, in remission.  Secondary erythrocytosis, improved. No need for phlebtomoty.  Tobacco use, I encourage him to continue smoking cessation effort.   Follow up in .4 months.   [name], MD, PhD Hematology Oncology [facility] Pager- [phone] 08/29/2019

## 2019-12-25 ENCOUNTER — Other Ambulatory Visit: Payer: Self-pay

## 2019-12-25 ENCOUNTER — Other Ambulatory Visit
Admission: RE | Admit: 2019-12-25 | Discharge: 2019-12-25 | Disposition: A | Discharge status: Home / Self Care | Payer: 59 | Source: Ambulatory Visit | Attending: Gastroenterology | Admitting: Gastroenterology

## 2019-12-25 DIAGNOSIS — Z20822 Contact with and (suspected) exposure to covid-19: Secondary | ICD-10-CM | POA: Diagnosis not present

## 2019-12-25 DIAGNOSIS — Z01818 Encounter for other preprocedural examination: Secondary | ICD-10-CM | POA: Insufficient documentation

## 2019-12-25 LAB — SARS CORONAVIRUS 2 (TAT 6-24 HRS): SARS Coronavirus 2: NEGATIVE

## 2019-12-28 ENCOUNTER — Encounter: Payer: Self-pay | Admitting: *Deleted

## 2019-12-29 ENCOUNTER — Ambulatory Visit: Payer: 59 | Admitting: Certified Registered Nurse Anesthetist

## 2019-12-29 ENCOUNTER — Ambulatory Visit
Admission: RE | Admit: 2019-12-29 | Discharge: 2019-12-29 | Disposition: A | Discharge status: Home / Self Care | Payer: 59 | Attending: Gastroenterology | Admitting: Gastroenterology

## 2019-12-29 ENCOUNTER — Encounter
Admission: RE | Disposition: A | Discharge status: Home / Self Care | Payer: Self-pay | Source: Home / Self Care | Attending: Gastroenterology

## 2019-12-29 ENCOUNTER — Encounter: Payer: Self-pay | Admitting: *Deleted

## 2019-12-29 DIAGNOSIS — K640 First degree hemorrhoids: Secondary | ICD-10-CM | POA: Diagnosis not present

## 2019-12-29 DIAGNOSIS — F17200 Nicotine dependence, unspecified, uncomplicated: Secondary | ICD-10-CM | POA: Insufficient documentation

## 2019-12-29 DIAGNOSIS — E119 Type 2 diabetes mellitus without complications: Secondary | ICD-10-CM | POA: Insufficient documentation

## 2019-12-29 DIAGNOSIS — K5730 Diverticulosis of large intestine without perforation or abscess without bleeding: Secondary | ICD-10-CM | POA: Insufficient documentation

## 2019-12-29 DIAGNOSIS — D123 Benign neoplasm of transverse colon: Secondary | ICD-10-CM | POA: Diagnosis not present

## 2019-12-29 DIAGNOSIS — C9141 Hairy cell leukemia, in remission: Secondary | ICD-10-CM | POA: Diagnosis not present

## 2019-12-29 DIAGNOSIS — Z79899 Other long term (current) drug therapy: Secondary | ICD-10-CM | POA: Insufficient documentation

## 2019-12-29 DIAGNOSIS — D649 Anemia, unspecified: Secondary | ICD-10-CM | POA: Insufficient documentation

## 2019-12-29 DIAGNOSIS — I10 Essential (primary) hypertension: Secondary | ICD-10-CM | POA: Insufficient documentation

## 2019-12-29 DIAGNOSIS — Z1211 Encounter for screening for malignant neoplasm of colon: Secondary | ICD-10-CM | POA: Insufficient documentation

## 2019-12-29 DIAGNOSIS — Z86010 Personal history of colonic polyps: Secondary | ICD-10-CM | POA: Insufficient documentation

## 2019-12-29 HISTORY — PX: COLONOSCOPY WITH PROPOFOL: SHX5780

## 2019-12-29 HISTORY — DX: Diverticulitis of intestine, part unspecified, without perforation or abscess without bleeding: K57.92

## 2019-12-29 SURGERY — COLONOSCOPY WITH PROPOFOL
Anesthesia: General

## 2019-12-29 MED ORDER — PROPOFOL 10 MG/ML IV BOLUS
INTRAVENOUS | Status: DC | PRN
  Administered 2019-12-29: 60 mg via INTRAVENOUS
  Administered 2019-12-29: 10 mg via INTRAVENOUS
  Administered 2019-12-29: 20 mg via INTRAVENOUS

## 2019-12-29 MED ORDER — LIDOCAINE HCL (CARDIAC) PF 100 MG/5ML IV SOSY
PREFILLED_SYRINGE | INTRAVENOUS | Status: DC | PRN
  Administered 2019-12-29: 100 mg via INTRAVENOUS

## 2019-12-29 MED ORDER — LIDOCAINE HCL (PF) 2 % IJ SOLN
INTRAMUSCULAR | Status: AC
  Filled 2019-12-29: qty 5

## 2019-12-29 MED ORDER — PROPOFOL 500 MG/50ML IV EMUL
INTRAVENOUS | Status: DC | PRN
  Administered 2019-12-29: 150 ug/kg/min via INTRAVENOUS

## 2019-12-29 MED ORDER — PROPOFOL 500 MG/50ML IV EMUL
INTRAVENOUS | Status: AC
  Filled 2019-12-29: qty 50

## 2019-12-29 MED ORDER — SODIUM CHLORIDE 0.9 % IV SOLN: INTRAVENOUS | Status: DC

## 2019-12-29 NOTE — Anesthesia Preprocedure Evaluation (Signed)
Anesthesia Evaluation  Patient identified by MRN, date of birth, ID band Patient awake    Reviewed: Allergy & Precautions, NPO status , Patient's Chart, lab work & pertinent test results  History of Anesthesia Complications Negative for: history of anesthetic complications  Airway Mallampati: II  TM Distance: >3 FB Neck ROM: Full    Dental no notable dental hx. (+) Teeth Intact   Pulmonary neg sleep apnea, neg COPD, Current Smoker and Patient abstained from smoking.,    Pulmonary exam normal breath sounds clear to auscultation       Cardiovascular Exercise Tolerance: Good METShypertension, (-) CAD and (-) Past MI (-) dysrhythmias  Rhythm:Regular Rate:Normal - Systolic murmurs    Neuro/Psych negative neurological ROS  negative psych ROS   GI/Hepatic neg GERD  ,(+)     (-) substance abuse  ,   Endo/Other  diabetes  Renal/GU negative Renal ROS     Musculoskeletal   Abdominal   Peds  Hematology  (+) anemia , Hairy cell leukemia in remission   Anesthesia Other Findings Past Medical History: No date: Diabetes mellitus without complication ([facility]) No date: Diverticulitis 06/03/2018: Hairy cell leukemia ([facility]) No date: Hypertension  Reproductive/Obstetrics                             Anesthesia Physical Anesthesia Plan  ASA: II  Anesthesia Plan: General   Post-op Pain Management:    Induction: Intravenous  PONV Risk Score and Plan: 1 and Ondansetron, Propofol infusion and TIVA  Airway Management Planned: Nasal Cannula  Additional Equipment: None  Intra-op Plan:   Post-operative Plan:   Informed Consent: I have reviewed the patients History and Physical, chart, labs and discussed the procedure including the risks, benefits and alternatives for the proposed anesthesia with the patient or authorized representative who has indicated his/her understanding and acceptance.     Dental  advisory given  Plan Discussed with: CRNA and Surgeon  Anesthesia Plan Comments: (Discussed risks of anesthesia with patient, including possibility of difficulty with spontaneous ventilation under anesthesia necessitating airway intervention, PONV, and rare risks such as cardiac or respiratory or neurological events. Patient understands. Patient counseled on benefits of smoking cessation, and increased perioperative risks associated with continued smoking. )        Anesthesia Quick Evaluation

## 2019-12-29 NOTE — Interval H&P Note (Signed)
History and Physical Interval Note:  12/29/2019 9:03 AM  Roy Valentine  has presented today for surgery, with the diagnosis of Personal History Colon Polyps.  The various methods of treatment have been discussed with the patient and family. After consideration of risks, benefits and other options for treatment, the patient has consented to  Procedure(s): COLONOSCOPY WITH PROPOFOL (N/A) as a surgical intervention.  The patient's history has been reviewed, patient examined, no change in status, stable for surgery.  I have reviewed the patient's chart and labs.  Questions were answered to the patient's satisfaction.     Lesly Rubenstein  Ok to proceed with colonoscopy

## 2019-12-29 NOTE — Transfer of Care (Signed)
Immediate Anesthesia Transfer of Care Note  Patient: Roy Valentine  Procedure(s) Performed: COLONOSCOPY WITH PROPOFOL (N/A )  Patient Location: PACU  Anesthesia Type:General  Level of Consciousness: drowsy  Airway & Oxygen Therapy: Patient Spontanous Breathing  Post-op Assessment: Report given to RN and Post -op Vital signs reviewed and stable  Post vital signs: Reviewed and stable  Last Vitals:  Vitals Value Taken Time  BP 98/65 12/29/19 0927  Temp 36.7 C 12/29/19 0926  Pulse 87 12/29/19 0927  Resp 11 12/29/19 0927  SpO2 95 % 12/29/19 0927  Vitals shown include unvalidated device data.  Last Pain:  Vitals:   12/29/19 0926  TempSrc: Temporal  PainSc: Asleep         Complications: No complications documented.

## 2019-12-29 NOTE — H&P (Signed)
Outpatient short stay form Pre-procedure 12/29/2019 9:01 AM Roy Miyamoto MD, MPH  Primary Physician: NP Enid Derry  Reason for visit:  Surveillance  History of present illness:   49 y/o gentleman with history of diverticulitis requiring partial colon resection. Colonoscopy around 2012 with "polyps" but do not have these records. No blood thinners. No family history of GI malignancies.    Current Facility-Administered Medications:  .  0.9 %  sodium chloride infusion, , Intravenous, Continuous, [name], [name], MD, Last Rate: 20 mL/hr at 12/29/19 0841, New Bag at 12/29/19 0841  Medications Prior to Admission  Medication Sig Dispense Refill Last Dose  . lisinopril (ZESTRIL) 10 MG tablet Take 10 mg by mouth daily.    12/28/2019 at Unknown time  . metoprolol succinate (TOPROL-XL) 25 MG 24 hr tablet Take 1 tablet by mouth daily.   12/28/2019 at Unknown time  . buPROPion (WELLBUTRIN XL) 150 MG 24 hr tablet Take 150 mg by mouth daily.     Marland Kitchen icosapent Ethyl (VASCEPA) 1 g capsule Take 2 g by mouth 2 (two) times daily.      . rosuvastatin (CRESTOR) 10 MG tablet      . Semaglutide (RYBELSUS) 7 MG TABS Take 7 mg by mouth daily.     . Vitamin D, Ergocalciferol, (DRISDOL) 1.25 MG (50000 UT) CAPS capsule Take 50,000 Units by mouth once a week.        No Known Allergies   Past Medical History:  Diagnosis Date  . Diabetes mellitus without complication ([place])   . Diverticulitis   . 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 Hypertension     Review of systems:  Otherwise negative.    Physical Exam  Gen: Alert, oriented. Appears stated age.  HEENT: [place]/AT. PERRLA. Lungs: No respiratory distress Abd: soft, benign, no masses. BS+ Ext: No edema. Pulses 2+    Planned procedures: Proceed with colonoscopy. The patient understands the nature of the planned procedure, indications, risks, alternatives and potential complications including but not limited to bleeding, infection, perforation,  damage to internal organs and possible oversedation/side effects from anesthesia. The patient agrees and gives consent to proceed.  Please refer to procedure notes for findings, recommendations and patient disposition/instructions.     Roy Miyamoto MD, MPH Gastroenterology 12/29/2019  9:01 AM

## 2019-12-29 NOTE — Op Note (Signed)
[facility] Gastroenterology Patient Name: Roy Valentine Procedure Date: 12/29/2019 8:59 AM MRN: [id number] Account #: [id number] Date of Birth: 01-Apr-1970 Admit Type: Outpatient Age: 49 Room: [facility] ENDO ROOM 3 Gender: Male Note Status: Finalized Procedure:             Colonoscopy Indications:           High risk colon cancer surveillance: Personal history                         of colonic polyps Providers:             Andrey Farmer MD, MD Referring MD:          No Local Md, MD (Referring MD) Medicines:             Monitored Anesthesia Care Complications:         No immediate complications. Estimated blood loss:                         Minimal. Procedure:             Pre-Anesthesia Assessment:                        - Prior to the procedure, a History and Physical was                         performed, and patient medications and allergies were                         reviewed. The patient is competent. The risks and                         benefits of the procedure and the sedation options and                         risks were discussed with the patient. All questions                         were answered and informed consent was obtained.                         Patient identification and proposed procedure were                         verified by the physician, the nurse, the anesthetist                         and the technician in the endoscopy suite. Mental                         Status Examination: alert and oriented. Airway                         Examination: normal oropharyngeal airway and neck                         mobility. Respiratory Examination: clear to  auscultation. CV Examination: normal. Prophylactic                         Antibiotics: The patient does not require prophylactic                         antibiotics. Prior Anticoagulants: The patient has                         taken no previous anticoagulant or  antiplatelet                         agents. ASA Grade Assessment: II - A patient with mild                         systemic disease. After reviewing the risks and                         benefits, the patient was deemed in satisfactory                         condition to undergo the procedure. The anesthesia                         plan was to use monitored anesthesia care (MAC).                         Immediately prior to administration of medications,                         the patient was re-assessed for adequacy to receive                         sedatives. The heart rate, respiratory rate, oxygen                         saturations, blood pressure, adequacy of pulmonary                         ventilation, and response to care were monitored                         throughout the procedure. The physical status of the                         patient was re-assessed after the procedure.                        After obtaining informed consent, the colonoscope was                         passed under direct vision. Throughout the procedure,                         the patient's blood pressure, pulse, and oxygen                         saturations were monitored continuously. The  Colonoscope was introduced through the anus and                         advanced to the the cecum, identified by appendiceal                         orifice and ileocecal valve. The colonoscopy was                         performed without difficulty. The patient tolerated                         the procedure well. The quality of the bowel                         preparation was good. Findings:      The perianal and digital rectal examinations were normal.      A 3 mm polyp was found in the hepatic flexure. The polyp was sessile.       The polyp was removed with a cold snare. Resection and retrieval were       complete. Estimated blood loss was minimal.      A few small-mouthed  diverticula were found in the sigmoid colon,       descending colon and ascending colon.      Non-bleeding internal hemorrhoids were found during retroflexion. The       hemorrhoids were Grade I (internal hemorrhoids that do not prolapse).      The exam was otherwise without abnormality on direct and retroflexion       views. No obvious findings of an anastomosis from a prior surgery. Impression:            - One 3 mm polyp at the hepatic flexure, removed with                         a cold snare. Resected and retrieved.                        - Diverticulosis in the sigmoid colon, in the                         descending colon and in the ascending colon.                        - Non-bleeding internal hemorrhoids.                        - The examination was otherwise normal on direct and                         retroflexion views. No obvious findings of an                         anastomosis from a prior surgery. Recommendation:        - Discharge patient to home.                        - Resume previous diet.                        -  Continue present medications.                        - Await pathology results.                        - Repeat colonoscopy in 5 years for surveillance.                        - Return to referring physician as previously                         scheduled. Procedure Code(s):     --- Professional ---                        [phone], Colonoscopy, flexible; with removal of                         tumor(s), polyp(s), or other lesion(s) by snare                         technique Diagnosis Code(s):     --- Professional ---                        Z86.010, Personal history of colonic polyps                        K63.5, Polyp of colon                        K64.0, First degree hemorrhoids                        K57.30, Diverticulosis of large intestine without                         perforation or abscess without bleeding CPT copyright 2019 American Medical  Association. All rights reserved. The codes documented in this report are preliminary and upon coder review may  be revised to meet current compliance requirements. Andrey Farmer, MD Andrey Farmer MD, MD 12/29/2019 9:31:06 AM Number of Addenda: 0 Note Initiated On: 12/29/2019 8:59 AM Scope Withdrawal Time: 0 hours 10 minutes 50 seconds  Total Procedure Duration: 0 hours 13 minutes 29 seconds  Estimated Blood Loss:  Estimated blood loss was minimal.      [facility]

## 2019-12-29 NOTE — Anesthesia Postprocedure Evaluation (Signed)
Anesthesia Post Note  Patient: Roy Valentine  Procedure(s) Performed: COLONOSCOPY WITH PROPOFOL (N/A )  Patient location during evaluation: Endoscopy Anesthesia Type: General Level of consciousness: awake and alert Pain management: pain level controlled Vital Signs Assessment: post-procedure vital signs reviewed and stable Respiratory status: spontaneous breathing, nonlabored ventilation, respiratory function stable and patient connected to nasal cannula oxygen Cardiovascular status: blood pressure returned to baseline and stable Postop Assessment: no apparent nausea or vomiting Anesthetic complications: no   No complications documented.   Last Vitals:  Vitals:   12/29/19 0946 12/29/19 0956  BP: (!) 131/98 (!) 142/87  Pulse:    Resp:  15  Temp:    SpO2:      Last Pain:  Vitals:   12/29/19 0956  TempSrc:   PainSc: 0-No pain                 Arita Miss

## 2019-12-30 ENCOUNTER — Inpatient Hospital Stay: Payer: 59 | Attending: Oncology

## 2019-12-30 ENCOUNTER — Encounter: Payer: Self-pay | Admitting: Gastroenterology

## 2019-12-30 ENCOUNTER — Inpatient Hospital Stay (HOSPITAL_BASED_OUTPATIENT_CLINIC_OR_DEPARTMENT_OTHER): Payer: 59 | Admitting: Oncology

## 2019-12-30 ENCOUNTER — Other Ambulatory Visit: Payer: Self-pay

## 2019-12-30 VITALS — BP 137/78 | HR 76 | Temp 97.8°F | Resp 16 | Wt 172.2 lb

## 2019-12-30 DIAGNOSIS — C9141 Hairy cell leukemia, in remission: Secondary | ICD-10-CM

## 2019-12-30 DIAGNOSIS — Z720 Tobacco use: Secondary | ICD-10-CM | POA: Diagnosis not present

## 2019-12-30 DIAGNOSIS — F17210 Nicotine dependence, cigarettes, uncomplicated: Secondary | ICD-10-CM | POA: Diagnosis not present

## 2019-12-30 DIAGNOSIS — D751 Secondary polycythemia: Secondary | ICD-10-CM | POA: Diagnosis not present

## 2019-12-30 LAB — CBC WITH DIFFERENTIAL/PLATELET
Abs Immature Granulocytes: 0.04 10*3/uL (ref 0.00–0.07)
Basophils Absolute: 0.1 10*3/uL (ref 0.0–0.1)
Basophils Relative: 1 %
Eosinophils Absolute: 0.3 10*3/uL (ref 0.0–0.5)
Eosinophils Relative: 3 %
HCT: 54.3 % — ABNORMAL HIGH (ref 39.0–52.0)
Hemoglobin: 17.7 g/dL — ABNORMAL HIGH (ref 13.0–17.0)
Immature Granulocytes: 0 %
Lymphocytes Relative: 25 %
Lymphs Abs: 2.4 10*3/uL (ref 0.7–4.0)
MCH: 29.2 pg (ref 26.0–34.0)
MCHC: 32.6 g/dL (ref 30.0–36.0)
MCV: 89.6 fL (ref 80.0–100.0)
Monocytes Absolute: 0.6 10*3/uL (ref 0.1–1.0)
Monocytes Relative: 7 %
Neutro Abs: 6.2 10*3/uL (ref 1.7–7.7)
Neutrophils Relative %: 64 %
Platelets: 210 10*3/uL (ref 150–400)
RBC: 6.06 MIL/uL — ABNORMAL HIGH (ref 4.22–5.81)
RDW: 15.4 % (ref 11.5–15.5)
WBC: 9.6 10*3/uL (ref 4.0–10.5)
nRBC: 0 % (ref 0.0–0.2)

## 2019-12-30 LAB — COMPREHENSIVE METABOLIC PANEL
ALT: 23 U/L (ref 0–44)
AST: 17 U/L (ref 15–41)
Albumin: 4.5 g/dL (ref 3.5–5.0)
Alkaline Phosphatase: 64 U/L (ref 38–126)
Anion gap: 7 (ref 5–15)
BUN: 13 mg/dL (ref 6–20)
CO2: 29 mmol/L (ref 22–32)
Calcium: 9.6 mg/dL (ref 8.9–10.3)
Chloride: 102 mmol/L (ref 98–111)
Creatinine, Ser: 1.05 mg/dL (ref 0.61–1.24)
GFR, Estimated: >60 mL/min (ref >60)
Glucose, Bld: 100 mg/dL — ABNORMAL HIGH (ref 70–99)
Potassium: 4.4 mmol/L (ref 3.5–5.1)
Sodium: 138 mmol/L (ref 135–145)
Total Bilirubin: 0.4 mg/dL (ref 0.3–1.2)
Total Protein: 8.2 g/dL — ABNORMAL HIGH (ref 6.5–8.1)

## 2019-12-30 LAB — SURGICAL PATHOLOGY

## 2019-12-30 NOTE — Progress Notes (Signed)
Patient denies new problems/concerns today.   °

## 2019-12-30 NOTE — Progress Notes (Signed)
Hematology/Oncology follow up note [facility] Telephone:([phone]) [phone] Fax:(336) [phone]   Patient Care Team: [name], FNP as PCP - General (Family Medicine) [name], MD as Consulting Physician (Oncology)  REFERRING PROVIDER: Dr.Khan. REASON FOR VISIT:  Follow up for management of pancytopenia, hairy cell leukemia  HISTORY OF PRESENTING ILLNESS:  Roy Valentine is a  49 y.o.  male with PMH listed below who was referred to me for evaluation of pancytopenia.  Patient recently follows up with pcp and had lab work up done.  Cbc showed hemoglobin of 7, leukopenia with ANC 0.1, thrombocytopenia with platelet count   Fatigue: reports worsening fatigue. Chronic onset, perisistent, for the past few months. no aggravating or improving factors, no associated symptoms.  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    He has ED visit on 08/12/2017 due to cough and fever. CXR showed pneumonia.  Cbc on 08/12/2017 showed wbc 1.7, hemoglobin 16, platelet 247,000.  No differential was checked at that time. Patient was given a course of antibiotics and per patient his symptoms improved.   Denies hematochezia, hematuria, hematemesis, epistaxis, black tarry stool or easy bruising.  Denies fever or chills, any recent frequent infections.  He has lost about 10 pounds since May 2019. Appetite is fair.   # Bone marrow biopsy was sent to [facility] for second opinion.  Comments as below.  A. Peripheral blood and bone marrow biopsy; Outside consult, [id number], [facility], [place], [place]. Date of procedure 05-12-18: Small mature B-cell neoplasm. 70% leukemic infiltrate in a 75% cellular bone marrow. Markedly decreased hematopoiesis with focal moderate reticulin fibrosis. Moderate to marked pancytopenia with monocytopenia. See comment.  Comment: The neoplastic involvement in the bone marrow assumes interstitial infiltrate with aggregates of small  mature lymphocytes containing moderate amount of cytoplasm. Abnormal lymphocytes with hairy-like projects are occasionally seen on the aspirate smear, even though the aspirate is apparently hemodilute. Per the outside report, flow cytometric analysis demonstrates 3% lambda light chain restricted B-cell population that is positive for CD103 and CD25. 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 So far, the findings do not support the diagnosis of hairy cell leukemia variant; however, if hairy cell leukemia variant is considered as an alternative, molecular diagnostic test for BRAF [id number] mutation could be performed to rule out the possibility and confirm the diagnosis of hairy cell leukemia. This case was reviewed in our hematopathology QA conference, and a consensus was reached. We appreciate your sending this interesting case in consultation and entirely agree with your assessment and diagnosis of this case.   [facility] pathology opinion was discussed with patient in detail.     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 male who has above history reviewed by me today presents for follow up visit for management of hairy cell leukemia,  He reports feeling well and has no new complaints. he continues to smoke about 10 cigarettes daily.  No new complaints. .Denies weight loss, fever, chills, fatigue, night sweats.    Review of Systems  Constitutional: Negative for appetite change, chills, fatigue, fever and unexpected weight change.  HENT:   Negative for hearing loss and voice change.   Eyes: Negative for eye problems and icterus.  Respiratory: Negative for chest tightness, cough and shortness of breath.   Cardiovascular:  Negative for chest pain and leg swelling.  Gastrointestinal: Negative for abdominal distention, abdominal pain, blood in stool, constipation and nausea.  Endocrine: Negative for hot flashes.  Genitourinary: Negative for difficulty urinating, dysuria and frequency.   Musculoskeletal: Negative for arthralgias.  Skin: Negative for itching and rash.  Neurological: Negative for extremity weakness, light-headedness and numbness.  Hematological: Negative for adenopathy. Does not bruise/bleed easily.  Psychiatric/Behavioral: Negative for confusion.    MEDICAL HISTORY:  Past Medical History:  Diagnosis Date  . Diabetes mellitus without complication ([place])   . Diverticulitis   . 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 Hypertension     SURGICAL HISTORY: Past Surgical History:  Procedure Laterality Date  . COLON SURGERY     Patial Colectomy 2016  . COLONOSCOPY WITH PROPOFOL N/A 12/29/2019   Procedure: COLONOSCOPY WITH PROPOFOL;  Surgeon: Lesly Rubenstein, MD;  Location: [facility] ENDOSCOPY;  Service: Endoscopy;  Laterality: N/A;    SOCIAL HISTORY: Social History   Socioeconomic History  . Marital status: Single    Spouse name: Not on file  . Number of children: Not on file  . Years of education: Not on file  . Highest education level: Not on file  Occupational History  . Not on file  Tobacco Use  . Smoking status: Current Every Day Smoker    Packs/day: 1.00    Years: 30.00    Pack years: 30.00  . Smokeless tobacco: Never Used  Vaping Use  . Vaping Use: Never used  Substance and Sexual Activity  . Alcohol use: Never  . Drug use: Not Currently    Comment: smoked marijuana in college  . Sexual activity: Not on file  Other Topics Concern  . Not on file  Social History Narrative  . Not on file   Social Determinants of Health   Financial Resource Strain:   . Difficulty of Paying Living Expenses: Not on file  Food Insecurity:   . Worried About Charity fundraiser in the Last  Year: Not on file  . Ran Out of Food in the Last Year: Not on file  Transportation Needs:   . Lack of Transportation (Medical): Not on file  . Lack of Transportation (Non-Medical): Not on file  Physical Activity:   . Days of Exercise per Week: Not on file  . Minutes of Exercise per Session: Not on file  Stress:   . Feeling of Stress : Not on file  Social Connections:   . Frequency of Communication with Friends and Family: Not on file  . Frequency of Social Gatherings with Friends and Family: Not on file  . Attends Religious Services: Not on file  . Active Member of Clubs or Organizations: Not on file  . Attends Archivist Meetings: Not on file  . Marital Status: Not on file  Intimate Partner Violence:   . Fear of Current or Ex-Partner: Not on file  . Emotionally Abused: Not on file  . Physically Abused: Not on file  . Sexually Abused: Not on file    FAMILY HISTORY: Family History  Problem Relation Age of Onset  . Thyroid disease Mother   . Alzheimer's disease Mother     ALLERGIES:  has No Known Allergies.  MEDICATIONS:  Current Outpatient Medications  Medication Sig Dispense Refill  . aspirin 81 MG EC tablet Take by mouth.    Marland Kitchen buPROPion (WELLBUTRIN XL) 150 MG 24 hr  tablet Take 150 mg by mouth daily.    Marland Kitchen lisinopril (ZESTRIL) 10 MG tablet Take 10 mg by mouth daily.     . metoprolol succinate (TOPROL-XL) 25 MG 24 hr tablet Take 1 tablet by mouth daily.    . rosuvastatin (CRESTOR) 10 MG tablet     . Vitamin D, Ergocalciferol, (DRISDOL) 1.25 MG (50000 UT) CAPS capsule Take 50,000 Units by mouth once a week.    . icosapent Ethyl (VASCEPA) 1 g capsule Take 2 g by mouth 2 (two) times daily.  (Patient not taking: Reported on 12/30/2019)    . Semaglutide (RYBELSUS) 7 MG TABS Take 7 mg by mouth daily. (Patient not taking: Reported on 12/30/2019)     No current facility-administered medications for this visit.     PHYSICAL EXAMINATION: ECOG PERFORMANCE STATUS: 0 -  Asymptomatic Vitals:   12/30/19 1316  BP: 137/78  Pulse: 76  Resp: 16  Temp: 97.8 F (36.6 C)   Filed Weights   12/30/19 1316  Weight: 172 lb 3.2 oz (78.1 kg)    Physical Exam Constitutional:      General: He is not in acute distress. HENT:     Head: Normocephalic and atraumatic.     Mouth/Throat:     Comments:   Eyes:     General: No scleral icterus.    Pupils: Pupils are equal, round, and reactive to light.  Cardiovascular:     Rate and Rhythm: Normal rate and regular rhythm.     Heart sounds: Normal heart sounds.  Pulmonary:     Effort: Pulmonary effort is normal. No respiratory distress.     Breath sounds: No wheezing.  Abdominal:     General: Bowel sounds are normal. There is no distension.     Palpations: Abdomen is soft. There is no mass.     Tenderness: There is no abdominal tenderness.  Musculoskeletal:        General: No deformity. Normal range of motion.     Cervical back: Normal range of motion and neck supple.  Skin:    General: Skin is warm and dry.     Coloration: Skin is not pale.     Findings: No erythema or rash.  Neurological:     Mental Status: He is alert and oriented to person, place, and time. Mental status is at baseline.     Cranial Nerves: No cranial nerve deficit.     Coordination: Coordination normal.  Psychiatric:        Mood and Affect: Mood normal.        Behavior: Behavior normal.        Thought Content: Thought content normal.     RADIOGRAPHIC STUDIES: I have personally reviewed the radiological images as listed and agreed with the findings in the report.  CMP Latest Ref Rng & Units 12/30/2019  Glucose 70 - 99 mg/dL 100(H)  BUN 6 - 20 mg/dL 13  Creatinine 0.61 - 1.24 mg/dL 1.05  Sodium 135 - 145 mmol/L 138  Potassium 3.5 - 5.1 mmol/L 4.4  Chloride 98 - 111 mmol/L 102  CO2 22 - 32 mmol/L 29  Calcium 8.9 - 10.3 mg/dL 9.6  Total Protein 6.5 - 8.1 g/dL 8.2(H)  Total Bilirubin 0.3 - 1.2 mg/dL 0.4  Alkaline Phos 38 - 126  U/L 64  AST 15 - 41 U/L 17  ALT 0 - 44 U/L 23   CBC Latest Ref Rng & Units 12/30/2019  WBC 4.0 - 10.5 K/uL 9.6  Hemoglobin  13.0 - 17.0 g/dL 17.7(H)  Hematocrit 39 - 52 % 54.3(H)  Platelets 150 - 400 K/uL 210    LABORATORY DATA:  I have reviewed the data as listed Lab Results  Component Value Date   WBC 9.6 12/30/2019   HGB 17.7 (H) 12/30/2019   HCT 54.3 (H) 12/30/2019   MCV 89.6 12/30/2019   PLT 210 12/30/2019   Recent Labs    04/20/19 1101 08/28/19 1321 12/30/19 1237  NA 137 136 138  K 4.1 4.1 4.4  CL 101 100 102  CO2 28 28 29   GLUCOSE 162* 116* 100*  BUN 10 11 13   CREATININE 0.95 0.96 1.05  CALCIUM 9.7 9.1 9.6  GFRNONAA >60 >60 >60  GFRAA >60 >60  --   PROT 8.2* 7.6 8.2*  ALBUMIN 4.6 4.1 4.5  AST 15 19 17   ALT 23 26 23   ALKPHOS 81 66 64  BILITOT 0.5 0.5 0.4   Iron/TIBC/Ferritin/ %Sat    Component Value Date/Time   IRON 158 05/07/2018 1558   TIBC 403 05/07/2018 1558   FERRITIN 634 (H) 05/07/2018 1558   IRONPCTSAT 39 05/07/2018 1558     05/07/2018 peripheral blood flow cytometry showed CD103+, CD11c+, CD 25+, clonal B-cell population detected, representing 3% of the leukocytes, phenotype consistent with hairy cell leukemia.  05/12/2018 bone marrow biopsy Diagnosis Bone Marrow, Aspirate,Biopsy, and Clot, right iliac - HYPERCELLULAR BONE MARROW FOR AGE WITH INVOLVEMENT BY A B-CELL LYMPHOPROLIFERATIVE DISORDER. - SEE COMMENT. PERIPHERAL BLOOD: - PANCYTOPENIA. Diagnosis Note The overall material is suboptimal with lack of aspirate. Limited touch imprints and a core biopsy show extensive involvement by a B-cell lymphoproliferative process of primarily small to medium sized lymphocytes with a predominant interstitial pattern of involvement. Flow cytometric analysis was attempted but much of the material likely represents a peripheralized sample and is not considered entirely contributory. Immunohistochemical stains show that B-cells weakly express cyclin D1  but lack CD5, CD10, CD34, CD138, or TdT expression. 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 A repeat biopsy with fresh core biopsy material for flow cytometric study is strongly recommended for definite and accurate sub-classification of this process. (BNS:ecj 05/14/2018)   ASSESSMENT & PLAN:  1. Hairy cell leukemia, in remission ([place])   2. Secondary erythrocytosis   3. Tobacco abuse    #Hairy cell leukemia, clinically he is in remission. 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  Hematocrit above 50. I recommend phlebotomy. Tobacco use, smoking cessation was discussed with patient. Per patient, he has cut down smoking since last visit however hemoglobin appears to be worse. I recommend patient to discuss with primary care provider to have sleep study done to rule out other etiologies for secondary erythrocytosis.  I will copy this note to patient's primary care provider.  Follow up in .2 months.   [name], MD, PhD Hematology Oncology [facility] Pager- [phone] 12/30/2019

## 2020-01-07 ENCOUNTER — Inpatient Hospital Stay: Payer: 59

## 2020-01-07 ENCOUNTER — Other Ambulatory Visit: Payer: Self-pay

## 2020-01-07 VITALS — BP 107/74 | HR 72

## 2020-01-07 DIAGNOSIS — D751 Secondary polycythemia: Secondary | ICD-10-CM

## 2020-02-23 ENCOUNTER — Other Ambulatory Visit: Payer: Self-pay

## 2020-02-23 ENCOUNTER — Inpatient Hospital Stay: Payer: 59 | Attending: Oncology

## 2020-02-23 ENCOUNTER — Inpatient Hospital Stay (HOSPITAL_BASED_OUTPATIENT_CLINIC_OR_DEPARTMENT_OTHER): Payer: 59 | Admitting: Oncology

## 2020-02-23 ENCOUNTER — Encounter: Payer: Self-pay | Admitting: Oncology

## 2020-02-23 DIAGNOSIS — D751 Secondary polycythemia: Secondary | ICD-10-CM

## 2020-02-23 DIAGNOSIS — C9141 Hairy cell leukemia, in remission: Secondary | ICD-10-CM

## 2020-02-23 DIAGNOSIS — Z720 Tobacco use: Secondary | ICD-10-CM | POA: Diagnosis not present

## 2020-02-23 LAB — CBC WITH DIFFERENTIAL/PLATELET
Abs Immature Granulocytes: 0.03 10*3/uL (ref 0.00–0.07)
Basophils Absolute: 0.1 10*3/uL (ref 0.0–0.1)
Basophils Relative: 1 %
Eosinophils Absolute: 0.3 10*3/uL (ref 0.0–0.5)
Eosinophils Relative: 4 %
HCT: 55.9 % — ABNORMAL HIGH (ref 39.0–52.0)
Hemoglobin: 18.1 g/dL — ABNORMAL HIGH (ref 13.0–17.0)
Immature Granulocytes: 0 %
Lymphocytes Relative: 26 %
Lymphs Abs: 2.1 10*3/uL (ref 0.7–4.0)
MCH: 29.2 pg (ref 26.0–34.0)
MCHC: 32.4 g/dL (ref 30.0–36.0)
MCV: 90.3 fL (ref 80.0–100.0)
Monocytes Absolute: 0.7 10*3/uL (ref 0.1–1.0)
Monocytes Relative: 9 %
Neutro Abs: 5 10*3/uL (ref 1.7–7.7)
Neutrophils Relative %: 60 %
Platelets: 216 10*3/uL (ref 150–400)
RBC: 6.19 MIL/uL — ABNORMAL HIGH (ref 4.22–5.81)
RDW: 15 % (ref 11.5–15.5)
WBC: 8.2 10*3/uL (ref 4.0–10.5)
nRBC: 0 % (ref 0.0–0.2)

## 2020-02-23 NOTE — Progress Notes (Signed)
HEMATOLOGY-ONCOLOGY TeleHEALTH VISIT PROGRESS NOTE  I connected with Roy Valentine on @ENCDATE  @ at  2:15 PM EST by video enabled telemedicine visit and verified that I am speaking with the correct person using two identifiers. I discussed the limitations, risks, security and privacy concerns of performing an evaluation and management service by telemedicine and the availability of in-person appointments. The patient expressed understanding and agreed to proceed.   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 male who has above history reviewed by me today presents for follow up visit for management of erythrocytosis and history of hairy cell leukemia Problems and complaints are listed below:  He has stopped smoking for about 9 days. During the interval he has had in-house monitoring of oxygen level at night.  Per patient he was diagnosed with mild hypoxemia during sleep.  He is going to have official sleep apnea evaluation  Review of Systems  Constitutional: Negative for appetite change, chills, fatigue, fever and unexpected weight change.  HENT:   Negative for hearing loss and voice change.   Eyes: Negative for eye problems and icterus.  Respiratory: Negative for chest tightness, cough and shortness of breath.   Cardiovascular: Negative for chest pain and leg swelling.  Gastrointestinal: Negative for abdominal distention and abdominal pain.  Endocrine: Negative for hot flashes.  Genitourinary: Negative for difficulty urinating, dysuria and frequency.   Musculoskeletal: Negative for arthralgias.  Skin: Negative for itching and rash.  Neurological: Negative for light-headedness and numbness.  Hematological: Negative for adenopathy. Does not bruise/bleed easily.   Psychiatric/Behavioral: Negative for confusion.    Past Medical History:  Diagnosis Date  . Diabetes mellitus without complication ([place])   . Diverticulitis   . Hairy cell leukemia ([place]) 06/03/2018  . Hypertension    Past Surgical History:  Procedure Laterality Date  . COLON SURGERY     Patial Colectomy 2016  . COLONOSCOPY WITH PROPOFOL N/A 12/29/2019   Procedure: COLONOSCOPY WITH PROPOFOL;  Surgeon: Lesly Rubenstein, MD;  Location: [facility] ENDOSCOPY;  Service: Endoscopy;  Laterality: N/A;    Family History  Problem Relation Age of Onset  . Thyroid disease Mother   . Alzheimer's disease Mother     Social History   Socioeconomic History  . Marital status: Single    Spouse name: Not on file  . Number of children: Not on file  . Years of education: Not on file  . Highest education level: Not on file  Occupational History  . Not on file  Tobacco Use  . Smoking status: Current Every Day Smoker    Packs/day: 1.00    Years: 30.00    Pack years: 30.00  . Smokeless tobacco: Never Used  Vaping Use  . Vaping Use: Never used  Substance and Sexual Activity  . Alcohol use: Never  . Drug use: Not Currently    Comment: smoked marijuana in college  . Sexual activity: Not on file  Other Topics Concern  . Not on file  Social History Narrative  . Not on file   Social Determinants of Health   Financial Resource Strain:   . Difficulty of Paying Living Expenses: Not on file  Food Insecurity:   . Worried About Charity fundraiser in the Last Year: Not on file  . Ran Out of  Food in the Last Year: Not on file  Transportation Needs:   . Lack of Transportation (Medical): Not on file  . Lack of Transportation (Non-Medical): Not on file  Physical Activity:   . Days of Exercise per Week: Not on file  . Minutes of Exercise per Session: Not on file  Stress:   . Feeling of Stress : Not on file  Social Connections:   . Frequency of Communication with Friends and Family: Not on file  .  Frequency of Social Gatherings with Friends and Family: Not on file  . Attends Religious Services: Not on file  . Active Member of Clubs or Organizations: Not on file  . Attends Archivist Meetings: Not on file  . Marital Status: Not on file  Intimate Partner Violence:   . Fear of Current or Ex-Partner: Not on file  . Emotionally Abused: Not on file  . Physically Abused: Not on file  . Sexually Abused: Not on file    Current Outpatient Medications on File Prior to Visit  Medication Sig Dispense Refill  . aspirin 81 MG EC tablet Take by mouth.    Marland Kitchen buPROPion (WELLBUTRIN XL) 150 MG 24 hr tablet Take 150 mg by mouth daily.    Marland Kitchen lisinopril (ZESTRIL) 10 MG tablet Take 10 mg by mouth daily.     . metoprolol succinate (TOPROL-XL) 25 MG 24 hr tablet Take 1 tablet by mouth daily.    . rosuvastatin (CRESTOR) 10 MG tablet     . Semaglutide (RYBELSUS) 7 MG TABS Take 7 mg by mouth daily.     . Vitamin D, Ergocalciferol, (DRISDOL) 1.25 MG (50000 UT) CAPS capsule Take 50,000 Units by mouth once a week.    . icosapent Ethyl (VASCEPA) 1 g capsule Take 2 g by mouth 2 (two) times daily.  (Patient not taking: Reported on 12/30/2019)     No current facility-administered medications on file prior to visit.    No Known Allergies     Observations/Objective: Today's Vitals   02/23/20 1342  PainSc: 0-No pain   There is no height or weight on file to calculate BMI.  Physical Exam Neurological:     Mental Status: He is alert.     CBC    Component Value Date/Time   WBC 8.2 02/23/2020 1040   RBC 6.19 (H) 02/23/2020 1040   HGB 18.1 (H) 02/23/2020 1040   HGB 15.9 07/12/2014 0501   HCT 55.9 (H) 02/23/2020 1040   HCT 47.4 07/12/2014 0501   PLT 216 02/23/2020 1040   PLT 230 07/12/2014 0501   MCV 90.3 02/23/2020 1040   MCV 89 07/12/2014 0501   MCH 29.2 02/23/2020 1040   MCHC 32.4 02/23/2020 1040   RDW 15.0 02/23/2020 1040   RDW 15.0 (H) 07/12/2014 0501   LYMPHSABS 2.1 02/23/2020 1040    LYMPHSABS 1.7 07/12/2014 0501   MONOABS 0.7 02/23/2020 1040   MONOABS 1.7 (H) 07/12/2014 0501   EOSABS 0.3 02/23/2020 1040   EOSABS 0.4 07/12/2014 0501   BASOSABS 0.1 02/23/2020 1040   BASOSABS 0.1 07/12/2014 0501    CMP     Component Value Date/Time   NA 138 12/30/2019 1237   NA 134 (L) 07/16/2014 0523   K 4.4 12/30/2019 1237   K 4.2 07/16/2014 0523   CL 102 12/30/2019 1237   CL 97 (L) 07/16/2014 0523   CO2 29 12/30/2019 1237   CO2 29 07/16/2014 0523   GLUCOSE 100 (H) 12/30/2019 1237   GLUCOSE  98 07/16/2014 0523   BUN 13 12/30/2019 1237   BUN 15 07/16/2014 0523   CREATININE 1.05 12/30/2019 1237   CREATININE 0.76 07/16/2014 0523   CALCIUM 9.6 12/30/2019 1237   CALCIUM 8.9 07/16/2014 0523   PROT 8.2 (H) 12/30/2019 1237   PROT 6.1 (L) 07/08/2014 0437   ALBUMIN 4.5 12/30/2019 1237   ALBUMIN 2.4 (L) 07/08/2014 0437   AST 17 12/30/2019 1237   AST 14 (L) 07/08/2014 0437   ALT 23 12/30/2019 1237   ALT 26 07/04/2014 1844   ALKPHOS 64 12/30/2019 1237   ALKPHOS 63 07/08/2014 0437   BILITOT 0.4 12/30/2019 1237   BILITOT 0.5 07/04/2014 1844   GFRNONAA >60 12/30/2019 1237   GFRNONAA >60 07/16/2014 0523   GFRAA >60 08/28/2019 1321   GFRAA >60 07/16/2014 0523     Assessment and Plan: 1. Erythrocytosis   2. Hairy cell leukemia, in remission ([place])   3. Tobacco abuse     #Erythrocytosis, likely secondary to underlying sleep apnea Recommend patient to complete official evaluation of sleep apnea. Hematocrit is above 50.  Proceed with phlebotomy.  I will repeat H&H in 3 weeks and if hematocrit is above 50, proceed another phlebotomy.  Hairy cell leukemia, in remission.  Monitor symptoms Tobacco use, I encouraged patient's efforts of smoking cessation.  Follow Up Instructions:  8 weeks  I discussed the assessment and treatment plan with the patient. The patient was provided an opportunity to ask questions and all were answered. The patient agreed with the plan and demonstrated  an understanding of the instructions.  The patient was advised to call back or seek an in-person evaluation if the symptoms worsen or if the condition fails to improve as anticipated.    [name], MD 02/23/2020 9:10 PM

## 2020-02-23 NOTE — Progress Notes (Signed)
Patient contacted for Mychart visti. Pt reports that she had a sleep study and was diagnosed with mild sleep apnea.

## 2020-02-24 ENCOUNTER — Inpatient Hospital Stay: Payer: 59

## 2020-02-24 VITALS — BP 111/80 | HR 84 | Resp 20

## 2020-02-24 DIAGNOSIS — D751 Secondary polycythemia: Secondary | ICD-10-CM | POA: Diagnosis not present

## 2020-02-24 NOTE — Progress Notes (Signed)
Stable at discharge 

## 2020-03-16 ENCOUNTER — Inpatient Hospital Stay: Payer: 59

## 2020-03-16 DIAGNOSIS — D751 Secondary polycythemia: Secondary | ICD-10-CM

## 2020-03-16 LAB — HEMOGLOBIN AND HEMATOCRIT, BLOOD
HCT: 47.9 % (ref 39.0–52.0)
Hemoglobin: 15.9 g/dL (ref 13.0–17.0)

## 2020-03-16 NOTE — Progress Notes (Signed)
HCT 47.9 No phlebotomy required today. Pt reports having stopped smoking.

## 2020-04-22 ENCOUNTER — Inpatient Hospital Stay (HOSPITAL_BASED_OUTPATIENT_CLINIC_OR_DEPARTMENT_OTHER): Payer: 59 | Admitting: Oncology

## 2020-04-22 ENCOUNTER — Other Ambulatory Visit: Payer: Self-pay

## 2020-04-22 ENCOUNTER — Ambulatory Visit
Admission: RE | Admit: 2020-04-22 | Discharge: 2020-04-22 | Disposition: A | Discharge status: Home / Self Care | Payer: 59 | Source: Ambulatory Visit | Attending: Oncology | Admitting: Oncology

## 2020-04-22 ENCOUNTER — Inpatient Hospital Stay: Payer: 59 | Attending: Oncology

## 2020-04-22 ENCOUNTER — Inpatient Hospital Stay: Payer: 59

## 2020-04-22 ENCOUNTER — Encounter: Payer: Self-pay | Admitting: Oncology

## 2020-04-22 VITALS — BP 131/84 | HR 85 | Temp 97.4°F | Wt 182.7 lb

## 2020-04-22 VITALS — BP 114/80 | HR 82 | Temp 97.0°F | Resp 18

## 2020-04-22 DIAGNOSIS — D751 Secondary polycythemia: Secondary | ICD-10-CM

## 2020-04-22 DIAGNOSIS — F17210 Nicotine dependence, cigarettes, uncomplicated: Secondary | ICD-10-CM | POA: Diagnosis not present

## 2020-04-22 DIAGNOSIS — C9141 Hairy cell leukemia, in remission: Secondary | ICD-10-CM | POA: Insufficient documentation

## 2020-04-22 DIAGNOSIS — G4730 Sleep apnea, unspecified: Secondary | ICD-10-CM

## 2020-04-22 DIAGNOSIS — R5383 Other fatigue: Secondary | ICD-10-CM | POA: Insufficient documentation

## 2020-04-22 LAB — CBC WITH DIFFERENTIAL/PLATELET
Abs Immature Granulocytes: 0.03 10*3/uL (ref 0.00–0.07)
Basophils Absolute: 0.1 10*3/uL (ref 0.0–0.1)
Basophils Relative: 1 %
Eosinophils Absolute: 0.2 10*3/uL (ref 0.0–0.5)
Eosinophils Relative: 3 %
HCT: 53.1 % — ABNORMAL HIGH (ref 39.0–52.0)
Hemoglobin: 18 g/dL — ABNORMAL HIGH (ref 13.0–17.0)
Immature Granulocytes: 0 %
Lymphocytes Relative: 25 %
Lymphs Abs: 2.2 10*3/uL (ref 0.7–4.0)
MCH: 29.3 pg (ref 26.0–34.0)
MCHC: 33.9 g/dL (ref 30.0–36.0)
MCV: 86.3 fL (ref 80.0–100.0)
Monocytes Absolute: 1 10*3/uL (ref 0.1–1.0)
Monocytes Relative: 11 %
Neutro Abs: 5.4 10*3/uL (ref 1.7–7.7)
Neutrophils Relative %: 60 %
Platelets: 219 10*3/uL (ref 150–400)
RBC: 6.15 MIL/uL — ABNORMAL HIGH (ref 4.22–5.81)
RDW: 13.5 % (ref 11.5–15.5)
WBC: 8.9 10*3/uL (ref 4.0–10.5)
nRBC: 0 % (ref 0.0–0.2)

## 2020-04-22 LAB — COMPREHENSIVE METABOLIC PANEL
ALT: 25 U/L (ref 0–44)
AST: 21 U/L (ref 15–41)
Albumin: 4.5 g/dL (ref 3.5–5.0)
Alkaline Phosphatase: 62 U/L (ref 38–126)
Anion gap: 9 (ref 5–15)
BUN: 17 mg/dL (ref 6–20)
CO2: 28 mmol/L (ref 22–32)
Calcium: 9.6 mg/dL (ref 8.9–10.3)
Chloride: 100 mmol/L (ref 98–111)
Creatinine, Ser: 0.92 mg/dL (ref 0.61–1.24)
GFR, Estimated: >60 mL/min (ref >60)
Glucose, Bld: 101 mg/dL — ABNORMAL HIGH (ref 70–99)
Potassium: 4 mmol/L (ref 3.5–5.1)
Sodium: 137 mmol/L (ref 135–145)
Total Bilirubin: 0.5 mg/dL (ref 0.3–1.2)
Total Protein: 8.2 g/dL — ABNORMAL HIGH (ref 6.5–8.1)

## 2020-04-22 NOTE — Patient Instructions (Signed)

## 2020-04-22 NOTE — Progress Notes (Signed)
Hematology/Oncology follow up note [facility] [phone] [phone]) [phone]   Patient Care Team: [name], FNP as PCP - General (Family Medicine) [name], MD as Consulting Physician (Oncology)  REFERRING PROVIDER: Dr.Khan. REASON FOR VISIT:  Follow up for management of pancytopenia, hairy cell leukemia  HISTORY OF PRESENTING ILLNESS:  Roy Valentine is a  50 y.o.  male with PMH listed below who was referred to me for evaluation of pancytopenia.  Patient recently follows up with pcp and had lab work up done.  Cbc showed hemoglobin of 7, leukopenia with ANC 0.1, thrombocytopenia with platelet count   Fatigue: reports worsening fatigue. Chronic onset, perisistent, for the past few months. no aggravating or improving factors, no associated symptoms.  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    He has ED visit on 08/12/2017 due to cough and fever. CXR showed pneumonia.  Cbc on 08/12/2017 showed wbc 1.7, hemoglobin 16, platelet 247,000.  No differential was checked at that time. Patient was given a course of antibiotics and per patient his symptoms improved.   Denies hematochezia, hematuria, hematemesis, epistaxis, black tarry stool or easy bruising.  Denies fever or chills, any recent frequent infections.  He has lost about 10 pounds since May 2019. Appetite is fair.   # Bone marrow biopsy was sent to [facility] for second opinion.  Comments as below.  A. Peripheral blood and bone marrow biopsy; Outside consult, [id number], [facility], [place], [place]. Date of procedure 05-12-18: Small mature B-cell neoplasm. 70% leukemic infiltrate in a 75% cellular bone marrow. Markedly decreased hematopoiesis with focal moderate reticulin fibrosis. Moderate to marked pancytopenia with monocytopenia. See comment.  Comment: The neoplastic involvement in the bone marrow assumes interstitial infiltrate with aggregates of small  mature lymphocytes containing moderate amount of cytoplasm. Abnormal lymphocytes with hairy-like projects are occasionally seen on the aspirate smear, even though the aspirate is apparently hemodilute. Per the outside report, flow cytometric analysis demonstrates 3% lambda light chain restricted B-cell population that is positive for CD103 and CD25. 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 So far, the findings do not support the diagnosis of hairy cell leukemia variant; however, if hairy cell leukemia variant is considered as an alternative, molecular diagnostic test for BRAF [id number] mutation could be performed to rule out the possibility and confirm the diagnosis of hairy cell leukemia. This case was reviewed in our hematopathology QA conference, and a consensus was reached. We appreciate your sending this interesting case in consultation and entirely agree with your assessment and diagnosis of this case.   [facility] pathology opinion was discussed with patient in detail.     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 male who has above history reviewed by me today presents for follow up visit for management of hairy cell leukemia,  He reports feeling well and has no new complaints. he continues to smoke about 10 cigarettes daily.  No new complaints. .Denies weight loss, fever, chills, fatigue, night sweats.    Review of Systems  Constitutional: Negative for appetite change, chills, fatigue, fever and unexpected weight change.  HENT:   Negative for hearing loss and voice change.   Eyes: Negative for eye problems and icterus.  Respiratory: Negative for chest tightness, cough and shortness of breath.   Cardiovascular:  Negative for chest pain and leg swelling.  Gastrointestinal: Negative for abdominal distention, abdominal pain, blood in stool, constipation and nausea.  Endocrine: Negative for hot flashes.  Genitourinary: Negative for difficulty urinating, dysuria and frequency.   Musculoskeletal: Negative for arthralgias.  Skin: Negative for itching and rash.  Neurological: Negative for extremity weakness, light-headedness and numbness.  Hematological: Negative for adenopathy. Does not bruise/bleed easily.  Psychiatric/Behavioral: Negative for confusion.    MEDICAL HISTORY:  Past Medical History:  Diagnosis Date  . Diabetes mellitus without complication ([place])   . Diverticulitis   . Hairy cell leukemia ([place]) 06/03/2018  . Hypertension     SURGICAL HISTORY: Past Surgical History:  Procedure Laterality Date  . COLON SURGERY     Patial Colectomy 2016  . COLONOSCOPY WITH PROPOFOL N/A 12/29/2019   Procedure: COLONOSCOPY WITH PROPOFOL;  Surgeon: Lesly Rubenstein, MD;  Location: [facility] ENDOSCOPY;  Service: Endoscopy;  Laterality: N/A;    SOCIAL HISTORY: Social History   Socioeconomic History  . Marital status: Single    Spouse name: Not on file  . Number of children: Not on file  . Years of education: Not on file  . Highest education level: Not on file  Occupational History  . Not on file  Tobacco Use  . Smoking status: Current Every Day Smoker    Packs/day: 1.00    Years: 30.00    Pack years: 30.00  . Smokeless tobacco: Never Used  Vaping Use  . Vaping Use: Never used  Substance and Sexual Activity  . Alcohol use: Never  . Drug use: Not Currently    Comment: smoked marijuana in college  . Sexual activity: Not on file  Other Topics Concern  . Not on file  Social History Narrative  . Not on file   Social Determinants of Health   Financial Resource Strain: Not on file  Food Insecurity: Not on file  Transportation Needs: Not on file  Physical Activity: Not on file  Stress: Not  on file  Social Connections: Not on file  Intimate Partner Violence: Not on file    FAMILY HISTORY: Family History  Problem Relation Age of Onset  . Thyroid disease Mother   . Alzheimer's disease Mother     ALLERGIES:  has No Known Allergies.  MEDICATIONS:  Current Outpatient Medications  Medication Sig Dispense Refill  . aspirin 81 MG EC tablet Take by mouth.    Marland Kitchen buPROPion (WELLBUTRIN XL) 150 MG 24 hr tablet Take 150 mg by mouth daily.    Marland Kitchen lisinopril (ZESTRIL) 10 MG tablet Take 10 mg by mouth daily.     . metoprolol succinate (TOPROL-XL) 25 MG 24 hr tablet Take 1 tablet by mouth daily.    . rosuvastatin (CRESTOR) 10 MG tablet     . Semaglutide 7 MG TABS Take 7 mg by mouth daily.     . Vitamin D, Ergocalciferol, (DRISDOL) 1.25 MG (50000 UT) CAPS capsule Take 50,000 Units by mouth once a week.    . icosapent Ethyl (VASCEPA) 1 g capsule Take 2 g by mouth 2 (two) times daily.  (Patient not taking: Reported on 12/30/2019)     No current facility-administered medications for this visit.     PHYSICAL EXAMINATION: ECOG PERFORMANCE STATUS: 0 - Asymptomatic Vitals:   04/22/20 1331  BP: 131/84  Pulse: 85  Temp: (!) 97.4 F (36.3 C)  SpO2: 98%   Filed Weights  04/22/20 1331  Weight: 182 lb 11.2 oz (82.9 kg)    Physical Exam Constitutional:      General: He is not in acute distress. HENT:     Head: Normocephalic and atraumatic.     Mouth/Throat:     Comments:   Eyes:     General: No scleral icterus.    Pupils: Pupils are equal, round, and reactive to light.  Cardiovascular:     Rate and Rhythm: Normal rate and regular rhythm.     Heart sounds: Normal heart sounds.  Pulmonary:     Effort: Pulmonary effort is normal. No respiratory distress.     Breath sounds: No wheezing.  Abdominal:     General: Bowel sounds are normal. There is no distension.     Palpations: Abdomen is soft. There is no mass.     Tenderness: There is no abdominal tenderness.  Musculoskeletal:         General: No deformity. Normal range of motion.     Cervical back: Normal range of motion and neck supple.  Skin:    General: Skin is warm and dry.     Coloration: Skin is not pale.     Findings: No erythema or rash.  Neurological:     Mental Status: He is alert and oriented to person, place, and time. Mental status is at baseline.     Cranial Nerves: No cranial nerve deficit.     Coordination: Coordination normal.  Psychiatric:        Mood and Affect: Mood normal.        Behavior: Behavior normal.        Thought Content: Thought content normal.     RADIOGRAPHIC STUDIES: I have personally reviewed the radiological images as listed and agreed with the findings in the report.  CMP Latest Ref Rng & Units 04/22/2020  Glucose 70 - 99 mg/dL 101(H)  BUN 6 - 20 mg/dL 17  Creatinine 0.61 - 1.24 mg/dL 0.92  Sodium 135 - 145 mmol/L 137  Potassium 3.5 - 5.1 mmol/L 4.0  Chloride 98 - 111 mmol/L 100  CO2 22 - 32 mmol/L 28  Calcium 8.9 - 10.3 mg/dL 9.6  Total Protein 6.5 - 8.1 g/dL 8.2(H)  Total Bilirubin 0.3 - 1.2 mg/dL 0.5  Alkaline Phos 38 - 126 U/L 62  AST 15 - 41 U/L 21  ALT 0 - 44 U/L 25   CBC Latest Ref Rng & Units 04/22/2020  WBC 4.0 - 10.5 K/uL 8.9  Hemoglobin 13.0 - 17.0 g/dL 18.0(H)  Hematocrit 39.0 - 52.0 % 53.1(H)  Platelets 150 - 400 K/uL 219    LABORATORY DATA:  I have reviewed the data as listed Lab Results  Component Value Date   WBC 8.9 04/22/2020   HGB 18.0 (H) 04/22/2020   HCT 53.1 (H) 04/22/2020   MCV 86.3 04/22/2020   PLT 219 04/22/2020   Recent Labs    08/28/19 1321 12/30/19 1237 04/22/20 1310  NA 136 138 137  K 4.1 4.4 4.0  CL 100 102 100  CO2 28 29 28   GLUCOSE 116* 100* 101*  BUN 11 13 17   CREATININE 0.96 1.05 0.92  CALCIUM 9.1 9.6 9.6  GFRNONAA >60 >60 >60  GFRAA >60  --   --   PROT 7.6 8.2* 8.2*  ALBUMIN 4.1 4.5 4.5  AST 19 17 21   ALT 26 23 25   ALKPHOS 66 64 62  BILITOT 0.5 0.4 0.5   Iron/TIBC/Ferritin/ %Sat    Component  Value  Date/Time   IRON 158 05/07/2018 1558   TIBC 403 05/07/2018 1558   FERRITIN 634 (H) 05/07/2018 1558   IRONPCTSAT 39 05/07/2018 1558     05/07/2018 peripheral blood flow cytometry showed CD103+, CD11c+, CD 25+, clonal B-cell population detected, representing 3% of the leukocytes, phenotype consistent with hairy cell leukemia.  05/12/2018 bone marrow biopsy Diagnosis Bone Marrow, Aspirate,Biopsy, and Clot, right iliac - HYPERCELLULAR BONE MARROW FOR AGE WITH INVOLVEMENT BY A B-CELL LYMPHOPROLIFERATIVE DISORDER. - SEE COMMENT. PERIPHERAL BLOOD: - PANCYTOPENIA. Diagnosis Note The overall material is suboptimal with lack of aspirate. Limited touch imprints and a core biopsy show extensive involvement by a B-cell lymphoproliferative process of primarily small to medium sized lymphocytes with a predominant interstitial pattern of involvement. Flow cytometric analysis was attempted but much of the material likely represents a peripheralized sample and is not considered entirely contributory. Immunohistochemical stains show that B-cells weakly express cyclin D1 but lack CD5, CD10, CD34, CD138, or TdT expression. 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 A repeat biopsy with fresh core biopsy material for flow cytometric study is strongly recommended for definite and accurate sub-classification of this process. (BNS:ecj 05/14/2018)   ASSESSMENT & PLAN:  1. Erythrocytosis   2. Hairy cell leukemia, in remission ([place])   3. Sleep apnea, unspecified type    #Hairy cell leukemia, hematologically he is in remission. 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  Hematocrit above 50. I recommend proceed with phlebotomy 500 cc x 1 today. Repeat H&H in 4 weeks +/- phlebotomy.  Previous work-up showed most likely secondary erythrocytosis.  Tobacco use, patient has stopped smoking for about a months.  Encouraged he smoke cessation effort.  I will obtain chest x-ray  for erythrocytosis work-up. Sleep apnea, urged patient to call and obtain CPAP machine and start to use.  Follow up in 2 months.   [name], MD, PhD Hematology Oncology [facility] at [facility] Pager- [phone] 04/22/2020

## 2020-04-22 NOTE — Progress Notes (Signed)
Patient tolerated 500 ml therapeutic phlebotomy well. No c/o pain or any discomfort at discharge. VSS.

## 2020-04-22 NOTE — Progress Notes (Signed)
Patient denies new problems/concerns today.   °

## 2020-05-20 ENCOUNTER — Inpatient Hospital Stay: Payer: 59

## 2020-05-20 ENCOUNTER — Inpatient Hospital Stay: Payer: 59 | Attending: Oncology

## 2020-05-20 ENCOUNTER — Other Ambulatory Visit: Payer: Self-pay

## 2020-05-20 VITALS — BP 131/82 | HR 80 | Temp 97.5°F | Resp 16

## 2020-05-20 DIAGNOSIS — C9141 Hairy cell leukemia, in remission: Secondary | ICD-10-CM | POA: Insufficient documentation

## 2020-05-20 DIAGNOSIS — D751 Secondary polycythemia: Secondary | ICD-10-CM | POA: Insufficient documentation

## 2020-05-20 LAB — HEMATOCRIT: HCT: 52.5 % — ABNORMAL HIGH (ref 39.0–52.0)

## 2020-05-20 LAB — HEMOGLOBIN: Hemoglobin: 16.9 g/dL (ref 13.0–17.0)

## 2020-06-10 ENCOUNTER — Other Ambulatory Visit: Payer: Self-pay | Admitting: Otolaryngology

## 2020-06-10 DIAGNOSIS — H918X9 Other specified hearing loss, unspecified ear: Secondary | ICD-10-CM

## 2020-06-17 ENCOUNTER — Other Ambulatory Visit: Payer: Self-pay

## 2020-06-17 ENCOUNTER — Inpatient Hospital Stay: Payer: 59 | Attending: Oncology

## 2020-06-17 ENCOUNTER — Inpatient Hospital Stay: Payer: 59

## 2020-06-17 ENCOUNTER — Inpatient Hospital Stay (HOSPITAL_BASED_OUTPATIENT_CLINIC_OR_DEPARTMENT_OTHER): Payer: 59 | Admitting: Oncology

## 2020-06-17 ENCOUNTER — Encounter: Payer: Self-pay | Admitting: Oncology

## 2020-06-17 VITALS — BP 108/77 | HR 89 | Temp 98.2°F | Resp 18 | Wt 184.7 lb

## 2020-06-17 DIAGNOSIS — G4730 Sleep apnea, unspecified: Secondary | ICD-10-CM

## 2020-06-17 DIAGNOSIS — Z87891 Personal history of nicotine dependence: Secondary | ICD-10-CM | POA: Diagnosis not present

## 2020-06-17 DIAGNOSIS — D751 Secondary polycythemia: Secondary | ICD-10-CM

## 2020-06-17 DIAGNOSIS — C9141 Hairy cell leukemia, in remission: Secondary | ICD-10-CM | POA: Diagnosis not present

## 2020-06-17 LAB — CBC WITH DIFFERENTIAL/PLATELET
Abs Immature Granulocytes: 0.04 10*3/uL (ref 0.00–0.07)
Basophils Absolute: 0.1 10*3/uL (ref 0.0–0.1)
Basophils Relative: 1 %
Eosinophils Absolute: 0.2 10*3/uL (ref 0.0–0.5)
Eosinophils Relative: 3 %
HCT: 49.6 % (ref 39.0–52.0)
Hemoglobin: 16 g/dL (ref 13.0–17.0)
Immature Granulocytes: 1 %
Lymphocytes Relative: 26 %
Lymphs Abs: 2 10*3/uL (ref 0.7–4.0)
MCH: 28.7 pg (ref 26.0–34.0)
MCHC: 32.3 g/dL (ref 30.0–36.0)
MCV: 88.9 fL (ref 80.0–100.0)
Monocytes Absolute: 0.8 10*3/uL (ref 0.1–1.0)
Monocytes Relative: 11 %
Neutro Abs: 4.5 10*3/uL (ref 1.7–7.7)
Neutrophils Relative %: 58 %
Platelets: 220 10*3/uL (ref 150–400)
RBC: 5.58 MIL/uL (ref 4.22–5.81)
RDW: 14 % (ref 11.5–15.5)
WBC: 7.6 10*3/uL (ref 4.0–10.5)
nRBC: 0 % (ref 0.0–0.2)

## 2020-06-17 NOTE — Progress Notes (Signed)
Patient denies new problems/concerns today.   °

## 2020-06-17 NOTE — Progress Notes (Signed)
Per Duwayne Heck CMA per Dr. Tasia Catchings, no phlebotomy needed at this time.

## 2020-06-18 NOTE — Progress Notes (Signed)
Hematology/Oncology follow up note [facility] Telephone:([phone] Fax:(336) [phone]   Patient Care Team: [name], FNP as PCP - General (Family Medicine) [name], MD as Consulting Physician (Oncology)  REFERRING PROVIDER: Dr.Khan. REASON FOR VISIT:  Follow up for management of pancytopenia, hairy cell leukemia  HISTORY OF PRESENTING ILLNESS:  Roy Valentine is a  50 y.o.  male with PMH listed below who was referred to me for evaluation of pancytopenia.  Patient recently follows up with pcp and had lab work up done.  Cbc showed hemoglobin of 7, leukopenia with ANC 0.1, thrombocytopenia with platelet count   Fatigue: reports worsening fatigue. Chronic onset, perisistent, for the past few months. no aggravating or improving factors, no associated symptoms.  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    He has ED visit on 08/12/2017 due to cough and fever. CXR showed pneumonia.  Cbc on 08/12/2017 showed wbc 1.7, hemoglobin 16, platelet 247,000.  No differential was checked at that time. Patient was given a course of antibiotics and per patient his symptoms improved.   Denies hematochezia, hematuria, hematemesis, epistaxis, black tarry stool or easy bruising.  Denies fever or chills, any recent frequent infections.  He has lost about 10 pounds since May 2019. Appetite is fair.   # Bone marrow biopsy was sent to [facility] for second opinion.  Comments as below.  A. Peripheral blood and bone marrow biopsy; Outside consult, [id number], [facility], [place], [place]. Date of procedure 05-12-18: Small mature B-cell neoplasm. 70% leukemic infiltrate in a 75% cellular bone marrow. Markedly decreased hematopoiesis with focal moderate reticulin fibrosis. Moderate to marked pancytopenia with monocytopenia. See comment.  Comment: The neoplastic involvement in the bone marrow assumes interstitial infiltrate with aggregates of small  mature lymphocytes containing moderate amount of cytoplasm. Abnormal lymphocytes with hairy-like projects are occasionally seen on the aspirate smear, even though the aspirate is apparently hemodilute. Per the outside report, flow cytometric analysis demonstrates 3% lambda light chain restricted B-cell population that is positive for CD103 and CD25. 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 So far, the findings do not support the diagnosis of hairy cell leukemia variant; however, if hairy cell leukemia variant is considered as an alternative, molecular diagnostic test for BRAF [id number] mutation could be performed to rule out the possibility and confirm the diagnosis of hairy cell leukemia. This case was reviewed in our hematopathology QA conference, and a consensus was reached. We appreciate your sending this interesting case in consultation and entirely agree with your assessment and diagnosis of this case.   [facility] pathology opinion was discussed with patient in detail.     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 male who has above history reviewed by me today presents for follow up visit for management of hairy cell leukemia,  He reports feeling well and has no new complaints. Patient stopped smoking. He is still waiting for CPAP machine to be delivered Doing well no new complaints. .Denies weight loss, fever, chills, fatigue, night sweats.    Review of Systems  Constitutional: Negative for appetite change, chills, fatigue, fever and unexpected weight change.  HENT:   Negative for hearing loss and voice change.   Eyes: Negative  for eye problems and icterus.  Respiratory: Negative for chest tightness, cough and  shortness of breath.   Cardiovascular: Negative for chest pain and leg swelling.  Gastrointestinal: Negative for abdominal distention, abdominal pain, blood in stool, constipation and nausea.  Endocrine: Negative for hot flashes.  Genitourinary: Negative for difficulty urinating, dysuria and frequency.   Musculoskeletal: Negative for arthralgias.  Skin: Negative for itching and rash.  Neurological: Negative for extremity weakness, light-headedness and numbness.  Hematological: Negative for adenopathy. Does not bruise/bleed easily.  Psychiatric/Behavioral: Negative for confusion.    MEDICAL HISTORY:  Past Medical History:  Diagnosis Date  . Diabetes mellitus without complication ([place])   . Diverticulitis   . Hairy cell leukemia ([place]) 06/03/2018  . Hypertension     SURGICAL HISTORY: Past Surgical History:  Procedure Laterality Date  . COLON SURGERY     Patial Colectomy 2016  . COLONOSCOPY WITH PROPOFOL N/A 12/29/2019   Procedure: COLONOSCOPY WITH PROPOFOL;  Surgeon: Lesly Rubenstein, MD;  Location: [facility] ENDOSCOPY;  Service: Endoscopy;  Laterality: N/A;    SOCIAL HISTORY: Social History   Socioeconomic History  . Marital status: Single    Spouse name: Not on file  . Number of children: Not on file  . Years of education: Not on file  . Highest education level: Not on file  Occupational History  . Not on file  Tobacco Use  . Smoking status: Current Every Day Smoker    Packs/day: 1.00    Years: 30.00    Pack years: 30.00  . Smokeless tobacco: Never Used  Vaping Use  . Vaping Use: Never used  Substance and Sexual Activity  . Alcohol use: Never  . Drug use: Not Currently    Comment: smoked marijuana in college  . Sexual activity: Not on file  Other Topics Concern  . Not on file  Social History Narrative  . Not on file   Social Determinants of Health   Financial Resource Strain: Not on file  Food Insecurity: Not on file  Transportation Needs: Not on file   Physical Activity: Not on file  Stress: Not on file  Social Connections: Not on file  Intimate Partner Violence: Not on file    FAMILY HISTORY: Family History  Problem Relation Age of Onset  . Thyroid disease Mother   . Alzheimer's disease Mother     ALLERGIES:  has No Known Allergies.  MEDICATIONS:  Current Outpatient Medications  Medication Sig Dispense Refill  . aspirin 81 MG EC tablet Take by mouth.    Marland Kitchen buPROPion (WELLBUTRIN XL) 300 MG 24 hr tablet Take 300 mg by mouth daily.    Marland Kitchen lisinopril (ZESTRIL) 10 MG tablet Take 10 mg by mouth daily.     . metoprolol succinate (TOPROL-XL) 25 MG 24 hr tablet Take 1 tablet by mouth daily.    . rosuvastatin (CRESTOR) 10 MG tablet     . Semaglutide 7 MG TABS Take 7 mg by mouth daily.    Marland Kitchen buPROPion (WELLBUTRIN XL) 150 MG 24 hr tablet Take 150 mg by mouth daily. (Patient not taking: Reported on 06/17/2020)    . Vitamin D, Ergocalciferol, (DRISDOL) 1.25 MG (50000 UT) CAPS capsule Take 50,000 Units by mouth once a week. (Patient not taking: Reported on 06/17/2020)     No current facility-administered medications for this visit.     PHYSICAL EXAMINATION: ECOG PERFORMANCE STATUS: 0 - Asymptomatic Vitals:   06/17/20 1411  BP: 108/77  Pulse: 89  Resp: 18  Temp: 98.2  F (36.8 C)   Filed Weights   06/17/20 1411  Weight: 184 lb 11.2 oz (83.8 kg)    Physical Exam Constitutional:      General: He is not in acute distress. HENT:     Head: Normocephalic and atraumatic.     Mouth/Throat:     Comments:   Eyes:     General: No scleral icterus.    Pupils: Pupils are equal, round, and reactive to light.  Cardiovascular:     Rate and Rhythm: Normal rate and regular rhythm.     Heart sounds: Normal heart sounds.  Pulmonary:     Effort: Pulmonary effort is normal. No respiratory distress.     Breath sounds: No wheezing.  Abdominal:     General: Bowel sounds are normal. There is no distension.     Palpations: Abdomen is soft. There is  no mass.     Tenderness: There is no abdominal tenderness.  Musculoskeletal:        General: No deformity. Normal range of motion.     Cervical back: Normal range of motion and neck supple.  Skin:    General: Skin is warm and dry.     Coloration: Skin is not pale.     Findings: No erythema or rash.  Neurological:     Mental Status: He is alert and oriented to person, place, and time. Mental status is at baseline.     Cranial Nerves: No cranial nerve deficit.     Coordination: Coordination normal.  Psychiatric:        Mood and Affect: Mood normal.        Behavior: Behavior normal.        Thought Content: Thought content normal.     RADIOGRAPHIC STUDIES: I have personally reviewed the radiological images as listed and agreed with the findings in the report.  CMP Latest Ref Rng & Units 04/22/2020  Glucose 70 - 99 mg/dL 101(H)  BUN 6 - 20 mg/dL 17  Creatinine 0.61 - 1.24 mg/dL 0.92  Sodium 135 - 145 mmol/L 137  Potassium 3.5 - 5.1 mmol/L 4.0  Chloride 98 - 111 mmol/L 100  CO2 22 - 32 mmol/L 28  Calcium 8.9 - 10.3 mg/dL 9.6  Total Protein 6.5 - 8.1 g/dL 8.2(H)  Total Bilirubin 0.3 - 1.2 mg/dL 0.5  Alkaline Phos 38 - 126 U/L 62  AST 15 - 41 U/L 21  ALT 0 - 44 U/L 25   CBC Latest Ref Rng & Units 06/17/2020  WBC 4.0 - 10.5 K/uL 7.6  Hemoglobin 13.0 - 17.0 g/dL 16.0  Hematocrit 39.0 - 52.0 % 49.6  Platelets 150 - 400 K/uL 220    LABORATORY DATA:  I have reviewed the data as listed Lab Results  Component Value Date   WBC 7.6 06/17/2020   HGB 16.0 06/17/2020   HCT 49.6 06/17/2020   MCV 88.9 06/17/2020   PLT 220 06/17/2020   Recent Labs    08/28/19 1321 12/30/19 1237 04/22/20 1310  NA 136 138 137  K 4.1 4.4 4.0  CL 100 102 100  CO2 28 29 28   GLUCOSE 116* 100* 101*  BUN 11 13 17   CREATININE 0.96 1.05 0.92  CALCIUM 9.1 9.6 9.6  GFRNONAA >60 >60 >60  GFRAA >60  --   --   PROT 7.6 8.2* 8.2*  ALBUMIN 4.1 4.5 4.5  AST 19 17 21   ALT 26 23 25   ALKPHOS 66 64 62   BILITOT 0.5 0.4 0.5  Iron/TIBC/Ferritin/ %Sat    Component Value Date/Time   IRON 158 05/07/2018 1558   TIBC 403 05/07/2018 1558   FERRITIN 634 (H) 05/07/2018 1558   IRONPCTSAT 39 05/07/2018 1558     05/07/2018 peripheral blood flow cytometry showed CD103+, CD11c+, CD 25+, clonal B-cell population detected, representing 3% of the leukocytes, phenotype consistent with hairy cell leukemia.  05/12/2018 bone marrow biopsy Diagnosis Bone Marrow, Aspirate,Biopsy, and Clot, right iliac - HYPERCELLULAR BONE MARROW FOR AGE WITH INVOLVEMENT BY A B-CELL LYMPHOPROLIFERATIVE DISORDER. - SEE COMMENT. PERIPHERAL BLOOD: - PANCYTOPENIA. Diagnosis Note The overall material is suboptimal with lack of aspirate. Limited touch imprints and a core biopsy show extensive involvement by a B-cell lymphoproliferative process of primarily small to medium sized lymphocytes with a predominant interstitial pattern of involvement. Flow cytometric analysis was attempted but much of the material likely represents a peripheralized sample and is not considered entirely contributory. Immunohistochemical stains show that B-cells weakly express cyclin D1 but lack CD5, CD10, CD34, CD138, or TdT expression. 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 A repeat biopsy with fresh core biopsy material for flow cytometric study is strongly recommended for definite and accurate sub-classification of this process. (BNS:ecj 05/14/2018)   ASSESSMENT & PLAN:  1. Erythrocytosis   2. Hairy cell leukemia, in remission ([place])   3. Sleep apnea, unspecified type    #Hairy cell leukemia, hematologically he is in remission. Discussed about a bone marrow biopsy.  He declined.  Continue monitor clinically. #Secondary erythrocytosis,  He has stopped smoking.  Awaiting for CPAP machine for sleep apnea treatment.  Hematocrit is less than 50 today. No need for phlebotomy. Follow-up in 3 months.   [name], MD,  PhD Hematology Oncology [facility] Pager- [phone] 06/18/2020

## 2020-06-24 ENCOUNTER — Other Ambulatory Visit: Payer: Self-pay

## 2020-06-24 ENCOUNTER — Ambulatory Visit
Admission: RE | Admit: 2020-06-24 | Discharge: 2020-06-24 | Disposition: A | Discharge status: Home / Self Care | Payer: 59 | Source: Ambulatory Visit | Attending: Otolaryngology | Admitting: Otolaryngology

## 2020-06-24 DIAGNOSIS — H918X9 Other specified hearing loss, unspecified ear: Secondary | ICD-10-CM | POA: Diagnosis not present

## 2020-06-24 MED ORDER — GADOBUTROL 1 MMOL/ML IV SOLN
7.5000 mL | Freq: Once | INTRAVENOUS | Status: AC | PRN
  Administered 2020-06-24: 7.5 mL via INTRAVENOUS

## 2020-09-20 ENCOUNTER — Inpatient Hospital Stay: Payer: 59

## 2020-09-20 ENCOUNTER — Inpatient Hospital Stay: Payer: 59 | Attending: Oncology

## 2020-09-20 ENCOUNTER — Encounter: Payer: Self-pay | Admitting: Oncology

## 2020-09-20 ENCOUNTER — Inpatient Hospital Stay (HOSPITAL_BASED_OUTPATIENT_CLINIC_OR_DEPARTMENT_OTHER): Payer: 59 | Admitting: Oncology

## 2020-09-20 VITALS — BP 157/79 | HR 67 | Temp 97.5°F | Resp 16 | Wt 178.5 lb

## 2020-09-20 VITALS — BP 120/78 | HR 80 | Temp 97.8°F

## 2020-09-20 DIAGNOSIS — D751 Secondary polycythemia: Secondary | ICD-10-CM | POA: Diagnosis not present

## 2020-09-20 DIAGNOSIS — C9141 Hairy cell leukemia, in remission: Secondary | ICD-10-CM | POA: Insufficient documentation

## 2020-09-20 DIAGNOSIS — G4730 Sleep apnea, unspecified: Secondary | ICD-10-CM | POA: Diagnosis not present

## 2020-09-20 DIAGNOSIS — Z720 Tobacco use: Secondary | ICD-10-CM | POA: Diagnosis not present

## 2020-09-20 LAB — CBC WITH DIFFERENTIAL/PLATELET
Abs Immature Granulocytes: 0.06 10*3/uL (ref 0.00–0.07)
Basophils Absolute: 0.1 10*3/uL (ref 0.0–0.1)
Basophils Relative: 1 %
Eosinophils Absolute: 0.2 10*3/uL (ref 0.0–0.5)
Eosinophils Relative: 2 %
HCT: 52.9 % — ABNORMAL HIGH (ref 39.0–52.0)
Hemoglobin: 17.1 g/dL — ABNORMAL HIGH (ref 13.0–17.0)
Immature Granulocytes: 1 %
Lymphocytes Relative: 27 %
Lymphs Abs: 2 10*3/uL (ref 0.7–4.0)
MCH: 28.5 pg (ref 26.0–34.0)
MCHC: 32.3 g/dL (ref 30.0–36.0)
MCV: 88 fL (ref 80.0–100.0)
Monocytes Absolute: 0.8 10*3/uL (ref 0.1–1.0)
Monocytes Relative: 10 %
Neutro Abs: 4.5 10*3/uL (ref 1.7–7.7)
Neutrophils Relative %: 59 %
Platelets: 217 10*3/uL (ref 150–400)
RBC: 6.01 MIL/uL — ABNORMAL HIGH (ref 4.22–5.81)
RDW: 14 % (ref 11.5–15.5)
WBC: 7.6 10*3/uL (ref 4.0–10.5)
nRBC: 0 % (ref 0.0–0.2)

## 2020-09-20 LAB — COMPREHENSIVE METABOLIC PANEL
ALT: 23 U/L (ref 0–44)
AST: 19 U/L (ref 15–41)
Albumin: 4.5 g/dL (ref 3.5–5.0)
Alkaline Phosphatase: 62 U/L (ref 38–126)
Anion gap: 8 (ref 5–15)
BUN: 18 mg/dL (ref 6–20)
CO2: 26 mmol/L (ref 22–32)
Calcium: 9.5 mg/dL (ref 8.9–10.3)
Chloride: 99 mmol/L (ref 98–111)
Creatinine, Ser: 0.95 mg/dL (ref 0.61–1.24)
GFR, Estimated: >60 mL/min (ref >60)
Glucose, Bld: 105 mg/dL — ABNORMAL HIGH (ref 70–99)
Potassium: 3.8 mmol/L (ref 3.5–5.1)
Sodium: 133 mmol/L — ABNORMAL LOW (ref 135–145)
Total Bilirubin: 0.9 mg/dL (ref 0.3–1.2)
Total Protein: 8.1 g/dL (ref 6.5–8.1)

## 2020-09-20 NOTE — Patient Instructions (Signed)

## 2020-09-20 NOTE — Progress Notes (Signed)
500 ml blood removed per phlebotomy orders. Pt tolerated procedure well. VSS at time of discharge. Pt denies any symptoms or concerns post procedure.

## 2020-09-20 NOTE — Progress Notes (Signed)
Patient denies new problems/concerns today.   °

## 2020-09-20 NOTE — Progress Notes (Signed)
Hematology/Oncology follow up note [name] Telephone:([phone] Fax:(336) [phone]   Patient Care Team: [name], FNP as PCP - General (Family Medicine) [name], MD as Consulting Physician (Oncology)  REFERRING PROVIDER: Dr.Khan. REASON FOR VISIT:  Follow up for management of erythrocytosis hairy cell leukemia  HISTORY OF PRESENTING ILLNESS:  Roy Valentine is a  50 y.o.  male with PMH listed below who was referred to me for evaluation of pancytopenia.  Patient recently follows up with pcp and had lab work up done.  Cbc showed hemoglobin of 7, leukopenia with ANC 0.1, thrombocytopenia with platelet count   Fatigue: reports worsening fatigue. Chronic onset, perisistent, for the past few months. no aggravating or improving factors, no associated symptoms.  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    He has ED visit on 08/12/2017 due to cough and fever. CXR showed pneumonia.  Cbc on 08/12/2017 showed wbc 1.7, hemoglobin 16, platelet 247,000.  No differential was checked at that time. Patient was given a course of antibiotics and per patient his symptoms improved.   Denies hematochezia, hematuria, hematemesis, epistaxis, black tarry stool or easy bruising.  Denies fever or chills, any recent frequent infections.  He has lost about 10 pounds since May 2019. Appetite is fair.   # Bone marrow biopsy was sent to [facility] for second opinion.  Comments as below.  A. Peripheral blood and bone marrow biopsy; Outside consult, [id number], [facility], [place], [place]. Date of procedure 05-12-18: Small mature B-cell neoplasm. 70% leukemic infiltrate in a 75% cellular bone marrow. Markedly decreased hematopoiesis with focal moderate reticulin fibrosis. Moderate to marked pancytopenia with monocytopenia. See comment.  Comment: The neoplastic involvement in the bone marrow assumes interstitial infiltrate with aggregates of  small mature lymphocytes containing moderate amount of cytoplasm. Abnormal lymphocytes with hairy-like projects are occasionally seen on the aspirate smear, even though the aspirate is apparently hemodilute. Per the outside report, flow cytometric analysis demonstrates 3% lambda light chain restricted B-cell population that is positive for CD103 and CD25. 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 So far, the findings do not support the diagnosis of hairy cell leukemia variant; however, if hairy cell leukemia variant is considered as an alternative, molecular diagnostic test for BRAF V600E mutation could be performed to rule out the possibility and confirm the diagnosis of hairy cell leukemia. This case was reviewed in our hematopathology QA conference, and a consensus was reached. We appreciate your sending this interesting case in consultation and entirely agree with your assessment and diagnosis of this case.   [facility] pathology opinion was discussed with patient in detail.     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 male who has above history reviewed by me today presents for follow up visit for management of hairy cell leukemia,  He is still waiting for CPAP machine to be delivered No new complaints. .Denies weight loss, fever, chills, fatigue, night sweats.    Review of Systems  Constitutional:  Negative for appetite change, chills, fatigue, fever and unexpected weight change.  HENT:   Negative for hearing loss and voice change.   Eyes:  Negative for eye problems and icterus.  Respiratory:  Negative for chest tightness,  cough and shortness of breath.   Cardiovascular:  Negative for chest pain and leg swelling.   Gastrointestinal:  Negative for abdominal distention, abdominal pain, blood in stool, constipation and nausea.  Endocrine: Negative for hot flashes.  Genitourinary:  Negative for difficulty urinating, dysuria and frequency.   Musculoskeletal:  Negative for arthralgias.  Skin:  Negative for itching and rash.  Neurological:  Negative for extremity weakness, light-headedness and numbness.  Hematological:  Negative for adenopathy. Does not bruise/bleed easily.  Psychiatric/Behavioral:  Negative for confusion.    MEDICAL HISTORY:  Past Medical History:  Diagnosis Date   Diabetes mellitus without complication ([place])    Diverticulitis    Hairy cell leukemia ([place]) 06/03/2018   Hypertension     SURGICAL HISTORY: Past Surgical History:  Procedure Laterality Date   COLON SURGERY     Patial Colectomy 2016   COLONOSCOPY WITH PROPOFOL N/A 12/29/2019   Procedure: COLONOSCOPY WITH PROPOFOL;  Surgeon: Lesly Rubenstein, MD;  Location: [facility] ENDOSCOPY;  Service: Endoscopy;  Laterality: N/A;    SOCIAL HISTORY: Social History   Socioeconomic History   Marital status: Single    Spouse name: Not on file   Number of children: Not on file   Years of education: Not on file   Highest education level: Not on file  Occupational History   Not on file  Tobacco Use   Smoking status: Every Day    Packs/day: 1.00    Years: 30.00    Pack years: 30.00    Types: Cigarettes   Smokeless tobacco: Never  Vaping Use   Vaping Use: Never used  Substance and Sexual Activity   Alcohol use: Never   Drug use: Not Currently    Comment: smoked marijuana in college   Sexual activity: Not on file  Other Topics Concern   Not on file  Social History Narrative   Not on file   Social Determinants of Health   Financial Resource Strain: Not on file  Food Insecurity: Not on file  Transportation Needs: Not on file  Physical Activity: Not on file  Stress: Not on file  Social Connections: Not on file  Intimate  Partner Violence: Not on file    FAMILY HISTORY: Family History  Problem Relation Age of Onset   Thyroid disease Mother    Alzheimer's disease Mother     ALLERGIES:  has No Known Allergies.  MEDICATIONS:  Current Outpatient Medications  Medication Sig Dispense Refill   aspirin 81 MG EC tablet Take by mouth.     buPROPion (WELLBUTRIN XL) 300 MG 24 hr tablet Take 300 mg by mouth daily.     gemfibrozil (LOPID) 600 MG tablet Take 600 mg by mouth 2 (two) times daily before a meal.     lisinopril (ZESTRIL) 10 MG tablet Take 10 mg by mouth daily.      metoprolol succinate (TOPROL-XL) 25 MG 24 hr tablet Take 1 tablet by mouth daily.     rosuvastatin (CRESTOR) 10 MG tablet      buPROPion (WELLBUTRIN XL) 150 MG 24 hr tablet Take 150 mg by mouth daily. (Patient not taking: No sig reported)     RYBELSUS 14 MG TABS Take 1 tablet by mouth every morning.     Vitamin D, Ergocalciferol, (DRISDOL) 1.25 MG (50000 UT) CAPS capsule Take 50,000 Units by mouth once a week. (Patient not taking: No sig reported)     No current facility-administered medications for this visit.     PHYSICAL EXAMINATION: ECOG PERFORMANCE  STATUS: 0 - Asymptomatic Vitals:   09/20/20 1339  BP: (!) 157/79  Pulse: 67  Resp: 16  Temp: (!) 97.5 F (36.4 C)   Filed Weights   09/20/20 1339  Weight: 178 lb 8 oz (81 kg)    Physical Exam Constitutional:      General: He is not in acute distress. HENT:     Head: Normocephalic and atraumatic.     Mouth/Throat:     Comments:   Eyes:     General: No scleral icterus.    Pupils: Pupils are equal, round, and reactive to light.  Cardiovascular:     Rate and Rhythm: Normal rate and regular rhythm.     Heart sounds: Normal heart sounds.  Pulmonary:     Effort: Pulmonary effort is normal. No respiratory distress.     Breath sounds: No wheezing.  Abdominal:     General: Bowel sounds are normal. There is no distension.     Palpations: Abdomen is soft. There is no mass.      Tenderness: There is no abdominal tenderness.  Musculoskeletal:        General: No deformity. Normal range of motion.     Cervical back: Normal range of motion and neck supple.  Skin:    General: Skin is warm and dry.     Coloration: Skin is not pale.     Findings: No erythema or rash.  Neurological:     Mental Status: He is alert and oriented to person, place, and time. Mental status is at baseline.     Cranial Nerves: No cranial nerve deficit.     Coordination: Coordination normal.  Psychiatric:        Mood and Affect: Mood normal.        Behavior: Behavior normal.        Thought Content: Thought content normal.    RADIOGRAPHIC STUDIES: I have personally reviewed the radiological images as listed and agreed with the findings in the report.  CMP Latest Ref Rng & Units 09/20/2020  Glucose 70 - 99 mg/dL 105(H)  BUN 6 - 20 mg/dL 18  Creatinine 0.61 - 1.24 mg/dL 0.95  Sodium 135 - 145 mmol/L 133(L)  Potassium 3.5 - 5.1 mmol/L 3.8  Chloride 98 - 111 mmol/L 99  CO2 22 - 32 mmol/L 26  Calcium 8.9 - 10.3 mg/dL 9.5  Total Protein 6.5 - 8.1 g/dL 8.1  Total Bilirubin 0.3 - 1.2 mg/dL 0.9  Alkaline Phos 38 - 126 U/L 62  AST 15 - 41 U/L 19  ALT 0 - 44 U/L 23   CBC Latest Ref Rng & Units 09/20/2020  WBC 4.0 - 10.5 K/uL 7.6  Hemoglobin 13.0 - 17.0 g/dL 17.1(H)  Hematocrit 39.0 - 52.0 % 52.9(H)  Platelets 150 - 400 K/uL 217    LABORATORY DATA:  I have reviewed the data as listed Lab Results  Component Value Date   WBC 7.6 09/20/2020   HGB 17.1 (H) 09/20/2020   HCT 52.9 (H) 09/20/2020   MCV 88.0 09/20/2020   PLT 217 09/20/2020   Recent Labs    12/30/19 1237 04/22/20 1310 09/20/20 1314  NA 138 137 133*  K 4.4 4.0 3.8  CL 102 100 99  CO2 29 28 26   GLUCOSE 100* 101* 105*  BUN 13 17 18   CREATININE 1.05 0.92 0.95  CALCIUM 9.6 9.6 9.5  GFRNONAA >60 >60 >60  PROT 8.2* 8.2* 8.1  ALBUMIN 4.5 4.5 4.5  AST 17 21 19  ALT 23 25 23   ALKPHOS 64 62 62  BILITOT 0.4 0.5 0.9     Iron/TIBC/Ferritin/ %Sat    Component Value Date/Time   IRON 158 05/07/2018 1558   TIBC 403 05/07/2018 1558   FERRITIN 634 (H) 05/07/2018 1558   IRONPCTSAT 39 05/07/2018 1558     05/07/2018 peripheral blood flow cytometry showed CD103+, CD11c+, CD 25+, clonal B-cell population detected, representing 3% of the leukocytes, phenotype consistent with hairy cell leukemia.  05/12/2018 bone marrow biopsy Diagnosis Bone Marrow, Aspirate,Biopsy, and Clot, right iliac - HYPERCELLULAR BONE MARROW FOR AGE WITH INVOLVEMENT BY A B-CELL LYMPHOPROLIFERATIVE DISORDER. - SEE COMMENT. PERIPHERAL BLOOD: - PANCYTOPENIA. Diagnosis Note The overall material is suboptimal with lack of aspirate. Limited touch imprints and a core biopsy show extensive involvement by a B-cell lymphoproliferative process of primarily small to medium sized lymphocytes with a predominant interstitial pattern of involvement. Flow cytometric analysis was attempted but much of the material likely represents a peripheralized sample and is not considered entirely contributory. Immunohistochemical stains show that B-cells weakly express cyclin D1 but lack CD5, CD10, CD34, CD138, or TdT expression. 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 A repeat biopsy with fresh core biopsy material for flow cytometric study is strongly recommended for definite and accurate sub-classification of this process. (BNS:ecj 05/14/2018)   ASSESSMENT & PLAN:  1. Hairy cell leukemia, in remission ([place])   2. Sleep apnea, unspecified type   3. Tobacco abuse   4. Secondary erythrocytosis    #Hairy cell leukemia, hematologically he is in remission. Previously discussed about repeat bone marrow biopsy to document remission.  Patient would like to defer at this point.  He is in hematologic remission.  Continue monitor.  #Secondary erythrocytosis.  He has stopped smoking. Likely due to sleep apnea.  He is still waiting for CPAP  machine. Continue phlebotomy if hematocrit is above 50.  Follow-up in 3 months.  Lab CBC CMP/NP/possible phlebotomy Follow-up in 6 months   Lab CBC CMP/MD/possible phlebotomy   [name], MD, PhD Hematology Oncology [facility] Pager- [phone] 09/20/2020

## 2020-12-21 ENCOUNTER — Inpatient Hospital Stay: Payer: 59

## 2020-12-21 ENCOUNTER — Inpatient Hospital Stay (HOSPITAL_BASED_OUTPATIENT_CLINIC_OR_DEPARTMENT_OTHER): Payer: 59 | Admitting: Oncology

## 2020-12-21 ENCOUNTER — Inpatient Hospital Stay: Payer: 59 | Attending: Oncology

## 2020-12-21 VITALS — BP 129/83 | HR 85 | Temp 98.2°F | Resp 16 | Wt 179.2 lb

## 2020-12-21 DIAGNOSIS — C9141 Hairy cell leukemia, in remission: Secondary | ICD-10-CM | POA: Insufficient documentation

## 2020-12-21 DIAGNOSIS — D751 Secondary polycythemia: Secondary | ICD-10-CM

## 2020-12-21 LAB — CBC WITH DIFFERENTIAL/PLATELET
Abs Immature Granulocytes: 0.03 10*3/uL (ref 0.00–0.07)
Basophils Absolute: 0.1 10*3/uL (ref 0.0–0.1)
Basophils Relative: 1 %
Eosinophils Absolute: 0.3 10*3/uL (ref 0.0–0.5)
Eosinophils Relative: 5 %
HCT: 51.6 % (ref 39.0–52.0)
Hemoglobin: 16.9 g/dL (ref 13.0–17.0)
Immature Granulocytes: 1 %
Lymphocytes Relative: 28 %
Lymphs Abs: 1.6 10*3/uL (ref 0.7–4.0)
MCH: 29 pg (ref 26.0–34.0)
MCHC: 32.8 g/dL (ref 30.0–36.0)
MCV: 88.5 fL (ref 80.0–100.0)
Monocytes Absolute: 0.5 10*3/uL (ref 0.1–1.0)
Monocytes Relative: 9 %
Neutro Abs: 3.2 10*3/uL (ref 1.7–7.7)
Neutrophils Relative %: 56 %
Platelets: 216 10*3/uL (ref 150–400)
RBC: 5.83 MIL/uL — ABNORMAL HIGH (ref 4.22–5.81)
RDW: 13.9 % (ref 11.5–15.5)
WBC: 5.7 10*3/uL (ref 4.0–10.5)
nRBC: 0 % (ref 0.0–0.2)

## 2020-12-21 LAB — COMPREHENSIVE METABOLIC PANEL
ALT: 23 U/L (ref 0–44)
AST: 20 U/L (ref 15–41)
Albumin: 4.4 g/dL (ref 3.5–5.0)
Alkaline Phosphatase: 66 U/L (ref 38–126)
Anion gap: 8 (ref 5–15)
BUN: 14 mg/dL (ref 6–20)
CO2: 28 mmol/L (ref 22–32)
Calcium: 9.3 mg/dL (ref 8.9–10.3)
Chloride: 100 mmol/L (ref 98–111)
Creatinine, Ser: 1 mg/dL (ref 0.61–1.24)
GFR, Estimated: >60 mL/min (ref >60)
Glucose, Bld: 209 mg/dL — ABNORMAL HIGH (ref 70–99)
Potassium: 4.1 mmol/L (ref 3.5–5.1)
Sodium: 136 mmol/L (ref 135–145)
Total Bilirubin: 0.5 mg/dL (ref 0.3–1.2)
Total Protein: 7.8 g/dL (ref 6.5–8.1)

## 2020-12-21 NOTE — Progress Notes (Signed)
Hematology/Oncology follow up note [facility] [phone] [phone]   Patient Care Team: [name], FNP as PCP - General (Family Medicine) [name], MD as Consulting Physician (Oncology)  REFERRING PROVIDER: Dr.Khan. REASON FOR VISIT:  Follow up for management of erythrocytosis hairy cell leukemia  HISTORY OF PRESENTING ILLNESS:  Roy Valentine is a  50 y.o.  male with PMH listed below who was referred to me for evaluation of pancytopenia.  Patient recently follows up with pcp and had lab work up done.  Cbc showed hemoglobin of 7, leukopenia with ANC 0.1, thrombocytopenia with platelet count   Fatigue: reports worsening fatigue. Chronic onset, perisistent, for the past few months. no aggravating or improving factors, no associated symptoms.  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    He has ED visit on 08/12/2017 due to cough and fever. CXR showed pneumonia.  Cbc on 08/12/2017 showed wbc 1.7, hemoglobin 16, platelet 247,000.  No differential was checked at that time. Patient was given a course of antibiotics and per patient his symptoms improved.   Denies hematochezia, hematuria, hematemesis, epistaxis, black tarry stool or easy bruising.  Denies fever or chills, any recent frequent infections.  He has lost about 10 pounds since May 2019. Appetite is fair.   # Bone marrow biopsy was sent to [facility] for second opinion.  Comments as below.  A. Peripheral blood and bone marrow biopsy; Outside consult, [id number], [facility], [place], [place]. Date of procedure 05-12-18: Small mature B-cell neoplasm. 70% leukemic infiltrate in a 75% cellular bone marrow. Markedly decreased hematopoiesis with focal moderate reticulin fibrosis. Moderate to marked pancytopenia with monocytopenia. See comment.  [facility] pathology opinion was discussed with patient in detail.     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 Schertzer is a 50 year old male who is being followed for management of hairy cell leukemia and secondary erythrocytosis.  He denies any interval hospitalizations.  Denies any new concerns.  Specifically denies any weight loss, fever, chills, fatigue or night sweats.  He continues to abstain from tobacco.  He is using his CPAP machine as instructed.  Review of Systems  All other systems reviewed and are negative.  MEDICAL HISTORY:  Past Medical History:  Diagnosis Date   Diabetes mellitus without complication ([place])    Diverticulitis    Hairy cell leukemia ([place]) 06/03/2018   Hypertension     SURGICAL HISTORY: Past Surgical History:  Procedure Laterality Date   COLON SURGERY     Patial Colectomy 2016   COLONOSCOPY WITH PROPOFOL N/A 12/29/2019   Procedure: COLONOSCOPY WITH PROPOFOL;  Surgeon: Lesly Rubenstein, MD;  Location: [facility] ENDOSCOPY;  Service: Endoscopy;  Laterality: N/A;    SOCIAL HISTORY: Social History   Socioeconomic History   Marital status: Single    Spouse name: Not on file   Number of children: Not on file   Years of education: Not on file   Highest education level: Not on file  Occupational History   Not on file  Tobacco Use   Smoking status: Every Day    Packs/day: 1.00    Years: 30.00    Pack years: 30.00    Types: Cigarettes   Smokeless tobacco: Never  Vaping Use   Vaping Use: Never used  Substance and Sexual Activity   Alcohol use: Never   Drug use: Not  Currently    Comment: smoked marijuana in college   Sexual activity: Not on file  Other Topics Concern   Not on file  Social History Narrative   Not on file   Social Determinants of Health   Financial Resource Strain: Not on file  Food Insecurity: Not on file  Transportation Needs: Not on file  Physical Activity: Not on file  Stress: Not on file  Social Connections: Not on  file  Intimate Partner Violence: Not on file    FAMILY HISTORY: Family History  Problem Relation Age of Onset   Thyroid disease Mother    Alzheimer's disease Mother     ALLERGIES:  has No Known Allergies.  MEDICATIONS:  Current Outpatient Medications  Medication Sig Dispense Refill   aspirin 81 MG EC tablet Take by mouth.     buPROPion (WELLBUTRIN XL) 300 MG 24 hr tablet Take 300 mg by mouth daily.     gemfibrozil (LOPID) 600 MG tablet Take 600 mg by mouth 2 (two) times daily before a meal.     lisinopril (ZESTRIL) 10 MG tablet Take 10 mg by mouth daily.      metoprolol succinate (TOPROL-XL) 25 MG 24 hr tablet Take 1 tablet by mouth daily.     rosuvastatin (CRESTOR) 10 MG tablet      RYBELSUS 14 MG TABS Take 1 tablet by mouth every morning.     buPROPion (WELLBUTRIN XL) 150 MG 24 hr tablet Take 150 mg by mouth daily. (Patient not taking: Reported on 12/21/2020)     Vitamin D, Ergocalciferol, (DRISDOL) 1.25 MG (50000 UT) CAPS capsule Take 50,000 Units by mouth once a week. (Patient not taking: No sig reported)     No current facility-administered medications for this visit.     PHYSICAL EXAMINATION: ECOG PERFORMANCE STATUS: 0 - Asymptomatic Vitals:   12/21/20 1324  BP: 129/83  Pulse: 85  Resp: 16  Temp: 98.2 F (36.8 C)  SpO2: 98%   Filed Weights   12/21/20 1324  Weight: 179 lb 3.2 oz (81.3 kg)    Physical Exam Constitutional:      Appearance: Normal appearance.  HENT:     Head: Normocephalic and atraumatic.  Eyes:     Pupils: Pupils are equal, round, and reactive to light.  Cardiovascular:     Rate and Rhythm: Normal rate and regular rhythm.     Heart sounds: Normal heart sounds. No murmur heard. Pulmonary:     Effort: Pulmonary effort is normal.     Breath sounds: Normal breath sounds. No wheezing.  Abdominal:     General: Bowel sounds are normal. There is no distension.     Palpations: Abdomen is soft.     Tenderness: There is no abdominal tenderness.   Musculoskeletal:        General: Normal range of motion.     Cervical back: Normal range of motion.  Skin:    General: Skin is warm and dry.     Findings: No rash.  Neurological:     Mental Status: He is alert and oriented to person, place, and time.  Psychiatric:        Judgment: Judgment normal.    RADIOGRAPHIC STUDIES: I have personally reviewed the radiological images as listed and agreed with the findings in the report.  CMP Latest Ref Rng & Units 12/21/2020  Glucose 70 - 99 mg/dL 209(H)  BUN 6 - 20 mg/dL 14  Creatinine 0.61 - 1.24 mg/dL 1.00  Sodium 135 - 145  mmol/L 136  Potassium 3.5 - 5.1 mmol/L 4.1  Chloride 98 - 111 mmol/L 100  CO2 22 - 32 mmol/L 28  Calcium 8.9 - 10.3 mg/dL 9.3  Total Protein 6.5 - 8.1 g/dL 7.8  Total Bilirubin 0.3 - 1.2 mg/dL 0.5  Alkaline Phos 38 - 126 U/L 66  AST 15 - 41 U/L 20  ALT 0 - 44 U/L 23   CBC Latest Ref Rng & Units 12/21/2020  WBC 4.0 - 10.5 K/uL 5.7  Hemoglobin 13.0 - 17.0 g/dL 16.9  Hematocrit 39.0 - 52.0 % 51.6  Platelets 150 - 400 K/uL 216    LABORATORY DATA:  I have reviewed the data as listed Lab Results  Component Value Date   WBC 5.7 12/21/2020   HGB 16.9 12/21/2020   HCT 51.6 12/21/2020   MCV 88.5 12/21/2020   PLT 216 12/21/2020   Recent Labs    04/22/20 1310 09/20/20 1314 12/21/20 1309  NA 137 133* 136  K 4.0 3.8 4.1  CL 100 99 100  CO2 28 26 28   GLUCOSE 101* 105* 209*  BUN 17 18 14   CREATININE 0.92 0.95 1.00  CALCIUM 9.6 9.5 9.3  GFRNONAA >60 >60 >60  PROT 8.2* 8.1 7.8  ALBUMIN 4.5 4.5 4.4  AST 21 19 20   ALT 25 23 23   ALKPHOS 62 62 66  BILITOT 0.5 0.9 0.5   Iron/TIBC/Ferritin/ %Sat    Component Value Date/Time   IRON 158 05/07/2018 1558   TIBC 403 05/07/2018 1558   FERRITIN 634 (H) 05/07/2018 1558   IRONPCTSAT 39 05/07/2018 1558     05/07/2018 peripheral blood flow cytometry showed CD103+, CD11c+, CD 25+, clonal B-cell population detected, representing 3% of the leukocytes, phenotype  consistent with hairy cell leukemia.  05/12/2018 bone marrow biopsy Diagnosis Bone Marrow, Aspirate,Biopsy, and Clot, right iliac - HYPERCELLULAR BONE MARROW FOR AGE WITH INVOLVEMENT BY A B-CELL LYMPHOPROLIFERATIVE DISORDER. - SEE COMMENT. PERIPHERAL BLOOD: - PANCYTOPENIA. Diagnosis Note The overall material is suboptimal with lack of aspirate. Limited touch imprints and a core biopsy show extensive involvement by a B-cell lymphoproliferative process of primarily small to medium sized lymphocytes with a predominant interstitial pattern of involvement. Flow cytometric analysis was attempted but much of the material likely represents a peripheralized sample and is not considered entirely contributory. Immunohistochemical stains show that B-cells weakly express cyclin D1 but lack CD5, CD10, CD34, CD138, or TdT expression. 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 A repeat biopsy with fresh core biopsy material for flow cytometric study is strongly recommended for definite and accurate sub-classification of this process. (BNS:ecj 05/14/2018)   ASSESSMENT & PLAN: Mr. Jolliff is a 50 year old male who is followed for hairy cell leukemia who is clinically in remission.  Clinically he is doing well.  Denies any new concerns.  Labs from 12/21/2020 show hemoglobin of 16.9 and a hematocrit of 51.6.  Patient would like to hold off on phlebotomy at this time.  We will have him return to clinic in 3 months for repeat lab draw, see Dr. Tasia Catchings and possible phlebotomy.  Hairy cell leukemia-clinically he is doing well.  He denies any new symptoms at this time.  Plan is for repeat bone marrow biopsy at some point.   1. Erythrocytosis   2. Hairy cell leukemia, in remission ([place])    Disposition- Return to clinic in 3 months for repeat labs and possible phlebotomy.  No phlebotomy today.  I spent 15 minutes dedicated to the care of  this patient (face-to-face and non-face-to-face) on the  date of the encounter to include what is described in the assessment and plan.  Faythe Casa, NP 12/21/2020 2:27 PM

## 2020-12-21 NOTE — Progress Notes (Signed)
Pt has no concerns at this time. 

## 2021-03-16 ENCOUNTER — Encounter: Payer: Self-pay | Admitting: Oncology

## 2021-03-22 ENCOUNTER — Other Ambulatory Visit: Payer: 59

## 2021-03-22 ENCOUNTER — Ambulatory Visit: Payer: 59 | Admitting: Oncology

## 2021-03-24 ENCOUNTER — Inpatient Hospital Stay: Payer: Self-pay | Admitting: Oncology

## 2021-03-24 ENCOUNTER — Inpatient Hospital Stay: Payer: Self-pay

## 2021-03-24 ENCOUNTER — Inpatient Hospital Stay: Payer: Self-pay | Attending: Oncology

## 2021-08-28 ENCOUNTER — Other Ambulatory Visit: Payer: Self-pay | Admitting: Nurse Practitioner

## 2022-05-09 ENCOUNTER — Other Ambulatory Visit: Payer: Self-pay

## 2022-05-09 DIAGNOSIS — F411 Generalized anxiety disorder: Secondary | ICD-10-CM

## 2022-05-09 MED ORDER — BUPROPION HCL ER (XL) 300 MG PO TB24: 300.0000 mg | ORAL_TABLET | Freq: Every day | ORAL | 3 refills | Status: DC

## 2022-05-14 ENCOUNTER — Ambulatory Visit: Payer: 59 | Admitting: Family

## 2022-05-21 ENCOUNTER — Encounter: Payer: Self-pay | Admitting: Family

## 2022-05-21 ENCOUNTER — Ambulatory Visit (INDEPENDENT_AMBULATORY_CARE_PROVIDER_SITE_OTHER): Payer: 59 | Admitting: Family

## 2022-05-21 ENCOUNTER — Encounter: Payer: Self-pay | Admitting: Oncology

## 2022-05-21 VITALS — BP 130/68 | HR 80 | Ht 69.0 in | Wt 174.0 lb

## 2022-05-21 DIAGNOSIS — E1165 Type 2 diabetes mellitus with hyperglycemia: Secondary | ICD-10-CM | POA: Insufficient documentation

## 2022-05-21 DIAGNOSIS — E559 Vitamin D deficiency, unspecified: Secondary | ICD-10-CM | POA: Diagnosis not present

## 2022-05-21 DIAGNOSIS — E119 Type 2 diabetes mellitus without complications: Secondary | ICD-10-CM | POA: Diagnosis not present

## 2022-05-21 DIAGNOSIS — E782 Mixed hyperlipidemia: Secondary | ICD-10-CM

## 2022-05-21 DIAGNOSIS — I10 Essential (primary) hypertension: Secondary | ICD-10-CM | POA: Diagnosis not present

## 2022-05-21 DIAGNOSIS — E538 Deficiency of other specified B group vitamins: Secondary | ICD-10-CM | POA: Diagnosis not present

## 2022-05-21 DIAGNOSIS — D751 Secondary polycythemia: Secondary | ICD-10-CM | POA: Diagnosis not present

## 2022-05-21 DIAGNOSIS — F331 Major depressive disorder, recurrent, moderate: Secondary | ICD-10-CM | POA: Diagnosis not present

## 2022-05-21 DIAGNOSIS — R5383 Other fatigue: Secondary | ICD-10-CM

## 2022-05-21 DIAGNOSIS — F411 Generalized anxiety disorder: Secondary | ICD-10-CM | POA: Diagnosis not present

## 2022-05-21 DIAGNOSIS — I152 Hypertension secondary to endocrine disorders: Secondary | ICD-10-CM | POA: Insufficient documentation

## 2022-05-21 LAB — POCT CBG (FASTING - GLUCOSE)-MANUAL ENTRY: Glucose Fasting, POC: 172 mg/dL — AB (ref 70–99)

## 2022-05-21 MED ORDER — ESCITALOPRAM OXALATE 5 MG PO TABS: 5.0000 mg | ORAL_TABLET | Freq: Every day | ORAL | 0 refills | Status: DC

## 2022-05-21 MED ORDER — GEMFIBROZIL 600 MG PO TABS: 600.0000 mg | ORAL_TABLET | Freq: Two times a day (BID) | ORAL | 1 refills | Status: DC

## 2022-05-21 MED ORDER — ROSUVASTATIN CALCIUM 10 MG PO TABS: 10.0000 mg | ORAL_TABLET | Freq: Every day | ORAL | 3 refills | Status: DC

## 2022-05-21 MED ORDER — METOPROLOL SUCCINATE ER 25 MG PO TB24: 25.0000 mg | ORAL_TABLET | Freq: Every day | ORAL | 1 refills | Status: DC

## 2022-05-21 MED ORDER — LISINOPRIL 10 MG PO TABS: 10.0000 mg | ORAL_TABLET | Freq: Every day | ORAL | 1 refills | Status: DC

## 2022-05-21 MED ORDER — BUPROPION HCL ER (XL) 300 MG PO TB24: 300.0000 mg | ORAL_TABLET | Freq: Every day | ORAL | 3 refills | Status: DC

## 2022-05-21 NOTE — Assessment & Plan Note (Signed)
>>  ASSESSMENT AND PLAN FOR DIABETES MELLITUS WITHOUT COMPLICATION WRITTEN ON 05/21/2022 10:25 AM BY Grayling Congress M, FNP  Checking A1c today, will adjust medications as needed. Patient provided with phone numbers for 2 pharmacies that may have less trouble getting medication.

## 2022-05-21 NOTE — Assessment & Plan Note (Addendum)
Adding escitalopram to current medication.  Patient has had multiple losses in the past several months.   Will check in again in 6 weeks to reevaluate

## 2022-05-21 NOTE — Assessment & Plan Note (Signed)
Checking A1c today, will adjust medications as needed. Patient provided with phone numbers for 2 pharmacies that may have less trouble getting medication.

## 2022-05-21 NOTE — Assessment & Plan Note (Signed)
Continue current meds.  Patient has been well-controlled on current therapy. Checking labs in the office today.  Will adjust medications as needed

## 2022-05-21 NOTE — Progress Notes (Signed)
Established Patient Office Visit  Subjective:  Patient ID: Roy Valentine, male    DOB: 1970-06-14  Age: 52 y.o. MRN: [id number]  Chief Complaint  Patient presents with   Follow-up    4 month follow up    Diabetes He presents for his follow-up diabetic visit. He has type 2 diabetes mellitus. No MedicAlert identification noted. The initial diagnosis of diabetes was made 4 years ago. His disease course has been improving. There are no hypoglycemic associated symptoms. Associated symptoms include fatigue. There are no hypoglycemic complications. Symptoms are stable. There are no diabetic complications. Risk factors for coronary artery disease include diabetes mellitus, dyslipidemia, male sex, obesity and hypertension. Current diabetic treatments: Trulicity. He is compliant with treatment most of the time (when available with pharmacy). He participates in exercise three times a week. An ACE inhibitor/angiotensin II receptor blocker is being taken.  Hypertension This is a chronic problem. The current episode started more than 1 year ago. The problem is unchanged. The problem is controlled. There are no associated agents to hypertension. Risk factors for coronary artery disease include diabetes mellitus, dyslipidemia, obesity, male gender and stress. Past treatments include ACE inhibitors and beta blockers. The current treatment provides significant improvement. There are no compliance problems.   Depression      The patient presents with depression.  This is a recurrent problem.  The current episode started more than 1 year ago.   The onset quality is gradual.   The problem occurs intermittently.  The problem has been waxing and waning since onset.  Associated symptoms include decreased concentration, fatigue, insomnia and sad.  Associated symptoms include no helplessness, no hopelessness and no suicidal ideas.  Past treatments include other medications.  Compliance with treatment is good.   Previous treatment provided mild relief.  Risk factors: recent loss of best friend, family member, and pet.   Past medical history includes chronic illness and depression.      Past Medical History:  Diagnosis Date   Diabetes mellitus without complication ([place])    Diverticulitis    H/O diverticulitis of colon 09/09/2015   Hairy cell leukemia ([place]) 06/03/2018   Hypertension     Social History   Socioeconomic History   Marital status: Single    Spouse name: Not on file   Number of children: Not on file   Years of education: Not on file   Highest education level: Not on file  Occupational History   Not on file  Tobacco Use   Smoking status: Every Day    Packs/day: 1.00    Years: 30.00    Total pack years: 30.00    Types: Cigarettes   Smokeless tobacco: Never  Vaping Use   Vaping Use: Never used  Substance and Sexual Activity   Alcohol use: Never   Drug use: Not Currently    Comment: smoked marijuana in college   Sexual activity: Not on file  Other Topics Concern   Not on file  Social History Narrative   Not on file   Social Determinants of Health   Financial Resource Strain: Not on file  Food Insecurity: Not on file  Transportation Needs: Not on file  Physical Activity: Not on file  Stress: Not on file  Social Connections: Not on file  Intimate Partner Violence: Not on file    Family History  Problem Relation Age of Onset   Thyroid disease Mother    Alzheimer's disease Mother     Allergies  Allergen Reactions  Fenofibrate    Zetia [Ezetimibe]     Review of Systems  Constitutional:  Positive for fatigue.  Psychiatric/Behavioral:  Positive for decreased concentration and depression. Negative for suicidal ideas. The patient has insomnia.   All other systems reviewed and are negative.      Objective:   BP 130/68   Pulse 80   Ht '5\' 9"'$  (1.753 m)   Wt 174 lb (78.9 kg)   SpO2 98%   BMI 25.70 kg/m   Vitals:   05/21/22 0929  BP: 130/68  Pulse:  80  Height: '5\' 9"'$  (1.753 m)  Weight: 174 lb (78.9 kg)  SpO2: 98%  BMI (Calculated): 25.68    Physical Exam Vitals and nursing note reviewed.  Constitutional:      Appearance: Normal appearance. He is normal weight.  Eyes:     Pupils: Pupils are equal, round, and reactive to light.  Cardiovascular:     Rate and Rhythm: Normal rate and regular rhythm.     Pulses: Normal pulses.     Heart sounds: Normal heart sounds.  Pulmonary:     Effort: Pulmonary effort is normal.     Breath sounds: Normal breath sounds.  Neurological:     Mental Status: He is alert.  Psychiatric:        Mood and Affect: Mood normal.        Behavior: Behavior normal.      Results for orders placed or performed in visit on 05/21/22  POCT CBG (Fasting - Glucose)  Result Value Ref Range   Glucose Fasting, POC 172 (A) 70 - 99 mg/dL    Recent Results (from the past 2160 hour(s))  POCT CBG (Fasting - Glucose)     Status: Abnormal   Collection Time: 05/21/22  9:32 AM  Result Value Ref Range   Glucose Fasting, POC 172 (A) 70 - 99 mg/dL      Assessment & Plan:   Problem List Items Addressed This Visit     Erythrocytosis   Diabetes mellitus without complication ([place]) - Primary    Checking A1c today, will adjust medications as needed. Patient provided with phone numbers for 2 pharmacies that may have less trouble getting medication.      Relevant Medications   TRULICITY 4.5 0000000 SOPN   rosuvastatin (CRESTOR) 10 MG tablet   lisinopril (ZESTRIL) 10 MG tablet   Other Relevant Orders   POCT CBG (Fasting - Glucose) (Completed)   Hemoglobin A1c   Mixed hyperlipidemia    Continue current meds.  Patient has been well-controlled on current therapy. Checking labs in the office today.  Will adjust medications as needed      Relevant Medications   omega-3 acid ethyl esters (LOVAZA) 1 g capsule   tadalafil (CIALIS) 5 MG tablet   rosuvastatin (CRESTOR) 10 MG tablet   metoprolol succinate (TOPROL-XL)  25 MG 24 hr tablet   lisinopril (ZESTRIL) 10 MG tablet   gemfibrozil (LOPID) 600 MG tablet   Other Relevant Orders   Lipid panel   Vitamin D deficiency, unspecified   Relevant Orders   VITAMIN D 25 Hydroxy (Vit-D Deficiency, Fractures)   Essential hypertension, benign   Relevant Medications   omega-3 acid ethyl esters (LOVAZA) 1 g capsule   tadalafil (CIALIS) 5 MG tablet   rosuvastatin (CRESTOR) 10 MG tablet   metoprolol succinate (TOPROL-XL) 25 MG 24 hr tablet   lisinopril (ZESTRIL) 10 MG tablet   gemfibrozil (LOPID) 600 MG tablet   Other Relevant Orders  CBC With Differential   CMP14+EGFR   Moderate episode of recurrent major depressive disorder (HCC)    Adding escitalopram to current medication.  Patient has had multiple losses in the past several months.   Will check in again in 6 weeks to reevaluate      Relevant Medications   escitalopram (LEXAPRO) 5 MG tablet   buPROPion (WELLBUTRIN XL) 300 MG 24 hr tablet   Other Visit Diagnoses     Generalized anxiety disorder       Relevant Medications   escitalopram (LEXAPRO) 5 MG tablet   buPROPion (WELLBUTRIN XL) 300 MG 24 hr tablet   B12 deficiency due to diet       Relevant Orders   Vitamin B12   Other fatigue       Relevant Orders   CBC With Differential   CMP14+EGFR   TSH       Return in about 6 weeks (around 07/02/2022).   Total time spent: 30 minutes  [name], FNP  05/21/2022

## 2022-05-23 LAB — CBC WITH DIFFERENTIAL
Basophils Absolute: 0.1 10*3/uL (ref 0.0–0.2)
Basos: 1 %
EOS (ABSOLUTE): 0.1 10*3/uL (ref 0.0–0.4)
Eos: 2 %
Hematocrit: 51.2 % — ABNORMAL HIGH (ref 37.5–51.0)
Hemoglobin: 17.5 g/dL (ref 13.0–17.7)
Immature Grans (Abs): 0 10*3/uL (ref 0.0–0.1)
Immature Granulocytes: 1 %
Lymphocytes Absolute: 1.7 10*3/uL (ref 0.7–3.1)
Lymphs: 30 %
MCH: 29.3 pg (ref 26.6–33.0)
MCHC: 34.2 g/dL (ref 31.5–35.7)
MCV: 86 fL (ref 79–97)
Monocytes Absolute: 0.5 10*3/uL (ref 0.1–0.9)
Monocytes: 9 %
Neutrophils Absolute: 3.4 10*3/uL (ref 1.4–7.0)
Neutrophils: 57 %
RBC: 5.97 x10E6/uL — ABNORMAL HIGH (ref 4.14–5.80)
RDW: 12.4 % (ref 11.6–15.4)
WBC: 5.9 10*3/uL (ref 3.4–10.8)

## 2022-05-23 LAB — CMP14+EGFR
ALT: 28 IU/L (ref 0–44)
AST: 25 IU/L (ref 0–40)
Albumin/Globulin Ratio: 1.8 (ref 1.2–2.2)
Albumin: 5.4 g/dL — ABNORMAL HIGH (ref 3.8–4.9)
Alkaline Phosphatase: 88 IU/L (ref 44–121)
BUN/Creatinine Ratio: 15 (ref 9–20)
BUN: 17 mg/dL (ref 6–24)
Bilirubin Total: 0.3 mg/dL (ref 0.0–1.2)
CO2: 19 mmol/L — ABNORMAL LOW (ref 20–29)
Calcium: 11.5 mg/dL — ABNORMAL HIGH (ref 8.7–10.2)
Chloride: 99 mmol/L (ref 96–106)
Creatinine, Ser: 1.15 mg/dL (ref 0.76–1.27)
Globulin, Total: 3 g/dL (ref 1.5–4.5)
Glucose: 178 mg/dL — ABNORMAL HIGH (ref 70–99)
Potassium: 5.1 mmol/L (ref 3.5–5.2)
Sodium: 142 mmol/L (ref 134–144)
Total Protein: 8.4 g/dL (ref 6.0–8.5)
eGFR: 77 mL/min/{1.73_m2} (ref >59)

## 2022-05-23 LAB — LIPID PANEL
Chol/HDL Ratio: 3.3 ratio (ref 0.0–5.0)
Cholesterol, Total: 138 mg/dL (ref 100–199)
HDL: 42 mg/dL (ref >39)
LDL Chol Calc (NIH): 72 mg/dL (ref 0–99)
Triglycerides: 138 mg/dL (ref 0–149)
VLDL Cholesterol Cal: 24 mg/dL (ref 5–40)

## 2022-05-23 LAB — TSH: TSH: 0.935 u[IU]/mL (ref 0.450–4.500)

## 2022-05-23 LAB — HEMOGLOBIN A1C
Est. average glucose Bld gHb Est-mCnc: 272 mg/dL
Hgb A1c MFr Bld: 11.1 % — ABNORMAL HIGH (ref 4.8–5.6)

## 2022-05-23 LAB — VITAMIN B12: Vitamin B-12: 446 pg/mL (ref 232–1245)

## 2022-05-23 LAB — VITAMIN D 25 HYDROXY (VIT D DEFICIENCY, FRACTURES): Vit D, 25-Hydroxy: 54.6 ng/mL (ref 30.0–100.0)

## 2022-05-28 ENCOUNTER — Encounter: Payer: Self-pay | Admitting: Family

## 2022-05-29 ENCOUNTER — Other Ambulatory Visit: Payer: Self-pay | Admitting: Family

## 2022-07-02 ENCOUNTER — Encounter: Payer: Self-pay | Admitting: Family

## 2022-07-02 ENCOUNTER — Ambulatory Visit (INDEPENDENT_AMBULATORY_CARE_PROVIDER_SITE_OTHER): Payer: 59 | Admitting: Family

## 2022-07-02 VITALS — BP 122/72 | HR 84 | Ht 69.0 in | Wt 172.0 lb

## 2022-07-02 DIAGNOSIS — E1165 Type 2 diabetes mellitus with hyperglycemia: Secondary | ICD-10-CM | POA: Diagnosis not present

## 2022-07-02 DIAGNOSIS — F331 Major depressive disorder, recurrent, moderate: Secondary | ICD-10-CM | POA: Diagnosis not present

## 2022-07-02 LAB — POCT CBG (FASTING - GLUCOSE)-MANUAL ENTRY: Glucose Fasting, POC: 112 mg/dL — AB (ref 70–99)

## 2022-07-02 MED ORDER — RYBELSUS 7 MG PO TABS: 1.0000 | ORAL_TABLET | Freq: Every morning | ORAL | 3 refills | Status: DC

## 2022-07-02 MED ORDER — ESCITALOPRAM OXALATE 5 MG PO TABS: 5.0000 mg | ORAL_TABLET | Freq: Every day | ORAL | 0 refills | Status: DC

## 2022-07-02 NOTE — Assessment & Plan Note (Signed)
Patient reports greatly improved symptoms today, says he is feeling much better.  Doing more, and has less fatigue and anhedonia.   Continue current regimen.  Refills sent.

## 2022-07-02 NOTE — Assessment & Plan Note (Signed)
Sending refills for Rybelsus for patient.  Assisted with getting co-pay card.   Will complete PA once this is sent.

## 2022-07-02 NOTE — Progress Notes (Signed)
Established Patient Office Visit  Subjective:  Patient ID: Roy Valentine, male    DOB: 1970-12-24  Age: 52 y.o. MRN: [id number]  Chief Complaint  Patient presents with   Follow-up    6 week follow up    Patient is here today for his 6 week follow up.  We were reassessing his depressive symptoms and checking in on his diabetes today.   He says that his mood has improved greatly since he was here last, thinks that the escitalopram has been very helpful for him.   Has also been having trouble with his Trulicity, says that it is on back order and the pharmacy is unsure when it will be back.  He called his insurance company and they informed him that they would cover Rybelsus.   He has been on Rybelsus previously, and was doing well with this.  The only reason we had switched him was that his previous insurance did not cover.   No other concerns at this time.   Past Medical History:  Diagnosis Date   Diabetes mellitus without complication    Diverticulitis    H/O diverticulitis of colon 09/09/2015   Hairy cell leukemia 06/03/2018   Hypertension     Past Surgical History:  Procedure Laterality Date   COLON SURGERY     Patial Colectomy 2016   COLONOSCOPY WITH PROPOFOL N/A 12/29/2019   Procedure: COLONOSCOPY WITH PROPOFOL;  Surgeon: Regis Bill, MD;  Location: [facility] ENDOSCOPY;  Service: Endoscopy;  Laterality: N/A;    Social History   Socioeconomic History   Marital status: Single    Spouse name: Not on file   Number of children: Not on file   Years of education: Not on file   Highest education level: Not on file  Occupational History   Not on file  Tobacco Use   Smoking status: Every Day    Packs/day: 1.00    Years: 30.00    Additional pack years: 0.00    Total pack years: 30.00    Types: Cigarettes   Smokeless tobacco: Never  Vaping Use   Vaping Use: Never used  Substance and Sexual Activity   Alcohol use: Never   Drug use: Not Currently     Comment: smoked marijuana in college   Sexual activity: Not on file  Other Topics Concern   Not on file  Social History Narrative   Not on file   Social Determinants of Health   Financial Resource Strain: Not on file  Food Insecurity: Not on file  Transportation Needs: Not on file  Physical Activity: Not on file  Stress: Not on file  Social Connections: Not on file  Intimate Partner Violence: Not on file    Family History  Problem Relation Age of Onset   Thyroid disease Mother    Alzheimer's disease Mother     Allergies  Allergen Reactions   Fenofibrate    Zetia [Ezetimibe]     Review of Systems  All other systems reviewed and are negative.      Objective:   BP 122/72   Pulse 84   Ht  (1.753 m)   Wt 172 lb (78 kg)   SpO2 95%   BMI 25.40 kg/m   Vitals:   07/02/22 0939  BP: 122/72  Pulse: 84  Height:  (1.753 m)  Weight: 172 lb (78 kg)  SpO2: 95%  BMI (Calculated): 25.39    Physical Exam Vitals and nursing note reviewed.  Constitutional:  Appearance: Normal appearance. He is normal weight.  Eyes:     Pupils: Pupils are equal, round, and reactive to light.  Cardiovascular:     Rate and Rhythm: Normal rate and regular rhythm.     Pulses: Normal pulses.     Heart sounds: Normal heart sounds.  Pulmonary:     Effort: Pulmonary effort is normal.     Breath sounds: Normal breath sounds.  Neurological:     Mental Status: He is alert.  Psychiatric:        Mood and Affect: Mood normal.        Behavior: Behavior normal.        Thought Content: Thought content normal.        Judgment: Judgment normal.      Results for orders placed or performed in visit on 07/02/22  POCT CBG (Fasting - Glucose)  Result Value Ref Range   Glucose Fasting, POC 112 (A) 70 - 99 mg/dL       Assessment & Plan:   Problem List Items Addressed This Visit     Moderate episode of recurrent major depressive disorder    Patient reports greatly improved  symptoms today, says he is feeling much better.  Doing more, and has less fatigue and anhedonia.   Continue current regimen.  Refills sent.        Relevant Medications   escitalopram (LEXAPRO) 5 MG tablet   Type 2 diabetes mellitus with hyperglycemia, without long-term current use of insulin - Primary    Sending refills for Rybelsus for patient.  Assisted with getting co-pay card.   Will complete PA once this is sent.      Relevant Medications   RYBELSUS 7 MG TABS   Other Relevant Orders   POCT CBG (Fasting - Glucose) (Completed)    Return in about 3 months (around 10/01/2022) for F/U.   Total time spent: 20 minutes  Miki Kins, FNP  07/02/2022

## 2022-07-06 ENCOUNTER — Telehealth: Payer: Self-pay

## 2022-07-06 DIAGNOSIS — F411 Generalized anxiety disorder: Secondary | ICD-10-CM

## 2022-07-06 DIAGNOSIS — E1165 Type 2 diabetes mellitus with hyperglycemia: Secondary | ICD-10-CM

## 2022-07-06 MED ORDER — RYBELSUS 7 MG PO TABS: 1.0000 | ORAL_TABLET | Freq: Every morning | ORAL | 3 refills | Status: DC

## 2022-07-06 MED ORDER — ESCITALOPRAM OXALATE 5 MG PO TABS: 5.0000 mg | ORAL_TABLET | Freq: Every day | ORAL | 0 refills | Status: DC

## 2022-07-06 NOTE — Telephone Encounter (Signed)
Pt called regarding refills, rx refills sent

## 2022-08-15 ENCOUNTER — Encounter: Payer: Self-pay | Admitting: Family

## 2022-09-12 NOTE — Telephone Encounter (Signed)
Looks like it was changed to rybelsus

## 2022-09-25 ENCOUNTER — Other Ambulatory Visit: Payer: Self-pay | Admitting: Family

## 2022-09-25 DIAGNOSIS — F411 Generalized anxiety disorder: Secondary | ICD-10-CM

## 2022-09-30 NOTE — Progress Notes (Signed)
Established Patient Office Visit  Subjective:  Patient ID: Roy Valentine, male    DOB: 1970-07-30  Age: 52 y.o. MRN: [id number]  Chief Complaint  Patient presents with   Follow-up    3 mo F/U    Patient is here today for his 3 months follow up.  He has been feeling fairly well since last appointment.   He does have additional concerns to discuss today.  He has been unable to get his meds for his diabetes, as his insurance is not covering the Ozempic or Rybelsus, and the Trulicity is on backorder.   Labs are due today. He needs refills.   I have reviewed his active problem list, medication list, allergies, notes from last encounter, lab results for his appointment today.   No other concerns at this time.   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   Hypertension     Past Surgical History:  Procedure Laterality Date   COLON SURGERY     Patial Colectomy 2016   COLONOSCOPY WITH PROPOFOL N/A 12/29/2019   Procedure: COLONOSCOPY WITH PROPOFOL;  Surgeon: Regis Bill, MD;  Location: [facility] ENDOSCOPY;  Service: Endoscopy;  Laterality: N/A;    Social History   Socioeconomic History   Marital status: Single    Spouse name: Not on file   Number of children: Not on file   Years of education: Not on file   Highest education level: Not on file  Occupational History   Not on file  Tobacco Use   Smoking status: Every Day    Current packs/day: 1.00    Average packs/day: 1 pack/day for 30.0 years (30.0 ttl pk-yrs)    Types: Cigarettes   Smokeless tobacco: Never  Vaping Use   Vaping status: Never Used  Substance and Sexual Activity   Alcohol use: Never   Drug use: Not Currently    Comment: smoked marijuana in college   Sexual activity: Not on file  Other Topics Concern   Not on file  Social History Narrative   Not on  file   Social Determinants of Health   Financial Resource Strain: Not on file  Food Insecurity: Not on file  Transportation Needs: Not on file  Physical Activity: Not on file  Stress: Not on file  Social Connections: Not on file  Intimate Partner Violence: Not on file    Family History  Problem Relation Age of Onset   Thyroid disease Mother    Alzheimer's disease Mother     Allergies  Allergen Reactions   Fenofibrate    Zetia [Ezetimibe]     Review of Systems  Constitutional:  Positive for malaise/fatigue.  All other systems reviewed and are negative.      Objective:   BP 100/65   Pulse 68   Ht 5\' 9"  (1.753 m)   Wt 169 lb (76.7 kg)   SpO2 94%   BMI 24.96 kg/m   Vitals:   10/01/22 0914  BP: 100/65  Pulse: 68  Height: 5\' 9"  (1.753 m)  Weight: 169 lb (76.7 kg)  SpO2: 94%  BMI (Calculated): 24.95    Physical Exam Vitals and nursing note reviewed.  Constitutional:      Appearance: Normal appearance. He is normal weight.  Eyes:     Extraocular Movements: Extraocular movements intact.     Conjunctiva/sclera: Conjunctivae normal.  Pupils: Pupils are equal, round, and reactive to light.  Cardiovascular:     Rate and Rhythm: Normal rate and regular rhythm.     Pulses: Normal pulses.  Pulmonary:     Effort: Pulmonary effort is normal.  Musculoskeletal:        General: Normal range of motion.  Neurological:     General: No focal deficit present.     Mental Status: He is alert and oriented to person, place, and time. Mental status is at baseline.  Psychiatric:        Mood and Affect: Mood normal.        Behavior: Behavior normal.        Thought Content: Thought content normal.        Judgment: Judgment normal.      Results for orders placed or performed in visit on 10/01/22  POC CREATINE & ALBUMIN,URINE  Result Value Ref Range   Microalbumin Ur, POC 30 mg/L   Creatinine, POC 50 mg/dL   Albumin/Creatinine Ratio, Urine, POC 30-300     Recent  Results (from the past 2160 hour(s))  POC CREATINE & ALBUMIN,URINE     Status: Abnormal   Collection Time: 10/01/22 10:05 AM  Result Value Ref Range   Microalbumin Ur, POC 30 mg/L   Creatinine, POC 50 mg/dL   Albumin/Creatinine Ratio, Urine, POC 30-300        Assessment & Plan:   Problem List Items Addressed This Visit       Active Problems   Neutropenia [facility])    Patient is seen by Hematology/Oncology, who manage this condition.  He is well controlled with current therapy.   Will defer to them for further changes to plan of care.      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  He is well controlled with current therapy.   Will defer to them for further changes to plan of care.      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  He is well controlled with current therapy.   Will defer to them for further changes to plan of care.      Relevant Medications   fluconazole (DIFLUCAN) 200 MG tablet   Tinea versicolor    Refilled Fluconazole for pt.  Will reassess at follow up and may discuss preventatives.       Relevant Medications   fluconazole (DIFLUCAN) 200 MG tablet   Mixed hyperlipidemia    Checking labs today.  Continue current therapy for lipid control. Will modify as needed based on labwork results.       Relevant Orders   Lipid panel   CBC With Differential   CMP14+EGFR   Vitamin D deficiency, unspecified    Checking labs today.  Will continue supplements as needed.       Relevant Orders   VITAMIN D 25 Hydroxy (Vit-D Deficiency, Fractures)   CBC With Differential   CMP14+EGFR   Essential hypertension, benign - Primary    Blood pressure well controlled with current medications.  Continue current therapy.  Will reassess at follow up.       Relevant Orders   CBC With Differential   CMP14+EGFR   Moderate episode of recurrent major  depressive disorder (HCC)    Patient stable.  Well controlled with current therapy.   Continue current meds.        Type 2 diabetes mellitus  with hyperglycemia, without long-term current use of insulin (HCC)    Starting pt. On Victoza, since his insurance says they will cover this.  I have instructed him on dosing, and provided AVS with instructions.       Relevant Medications   liraglutide (VICTOZA) 18 MG/3ML SOPN   liraglutide (VICTOZA) 18 MG/3ML SOPN (Start on 11/02/2022)   Other Relevant Orders   POC CREATINE & ALBUMIN,URINE (Completed)   CBC With Differential   CMP14+EGFR   Hemoglobin A1c   Thrombocytopenia (HCC)    Patient is seen by Hematology/Oncology, who manage this condition.  He is well controlled with current therapy.   Will defer to them for further changes to plan of care.      B12 deficiency due to diet    Checking labs today.  Will continue supplements as needed.       Relevant Orders   CBC With Differential   CMP14+EGFR   Vitamin B12    Return in about 3 months (around 01/01/2023) for F/U.   Total time spent: 30 minutes  Miki Kins, FNP  10/01/2022   This document may have been prepared by [facility] Voice Recognition software and as such may include unintentional dictation errors.

## 2022-10-01 ENCOUNTER — Encounter: Payer: Self-pay | Admitting: Family

## 2022-10-01 ENCOUNTER — Ambulatory Visit (INDEPENDENT_AMBULATORY_CARE_PROVIDER_SITE_OTHER): Payer: 59 | Admitting: Family

## 2022-10-01 VITALS — BP 100/65 | HR 68 | Ht 69.0 in | Wt 169.0 lb

## 2022-10-01 DIAGNOSIS — F331 Major depressive disorder, recurrent, moderate: Secondary | ICD-10-CM | POA: Diagnosis not present

## 2022-10-01 DIAGNOSIS — B360 Pityriasis versicolor: Secondary | ICD-10-CM

## 2022-10-01 DIAGNOSIS — D709 Neutropenia, unspecified: Secondary | ICD-10-CM

## 2022-10-01 DIAGNOSIS — C9141 Hairy cell leukemia, in remission: Secondary | ICD-10-CM | POA: Diagnosis not present

## 2022-10-01 DIAGNOSIS — D696 Thrombocytopenia, unspecified: Secondary | ICD-10-CM | POA: Diagnosis not present

## 2022-10-01 DIAGNOSIS — D61818 Other pancytopenia: Secondary | ICD-10-CM

## 2022-10-01 DIAGNOSIS — E1165 Type 2 diabetes mellitus with hyperglycemia: Secondary | ICD-10-CM

## 2022-10-01 DIAGNOSIS — I10 Essential (primary) hypertension: Secondary | ICD-10-CM

## 2022-10-01 DIAGNOSIS — E538 Deficiency of other specified B group vitamins: Secondary | ICD-10-CM | POA: Diagnosis not present

## 2022-10-01 DIAGNOSIS — E782 Mixed hyperlipidemia: Secondary | ICD-10-CM | POA: Diagnosis not present

## 2022-10-01 DIAGNOSIS — E559 Vitamin D deficiency, unspecified: Secondary | ICD-10-CM

## 2022-10-01 LAB — POC CREATINE & ALBUMIN,URINE
Creatinine, POC: 50 mg/dL
Microalbumin Ur, POC: 30 mg/L

## 2022-10-01 MED ORDER — LIRAGLUTIDE 18 MG/3ML ~~LOC~~ SOPN: PEN_INJECTOR | SUBCUTANEOUS | 0 refills | Status: DC

## 2022-10-01 MED ORDER — FLUCONAZOLE 200 MG PO TABS: 200.0000 mg | ORAL_TABLET | Freq: Every day | ORAL | 0 refills | Status: DC

## 2022-10-01 MED ORDER — LIRAGLUTIDE 18 MG/3ML ~~LOC~~ SOPN: 1.8000 mg | PEN_INJECTOR | Freq: Every day | SUBCUTANEOUS | 2 refills | Status: DC

## 2022-10-01 MED ORDER — PEN NEEDLES 32G X 4 MM MISC: 1.0000 | Freq: Every day | 1 refills | Status: DC

## 2022-10-01 MED ORDER — PEN NEEDLES 32G X 4 MM MISC: 1.0000 | Freq: Every day | 1 refills | Status: AC

## 2022-10-01 NOTE — Assessment & Plan Note (Signed)
Checking labs today.  Will continue supplements as needed.  

## 2022-10-01 NOTE — Assessment & Plan Note (Signed)
Patient is seen by Hematology/Oncology, who manage this condition.  He is well controlled with current therapy.   Will defer to them for further changes to plan of care.

## 2022-10-01 NOTE — Assessment & Plan Note (Signed)
Checking labs today.  Continue current therapy for lipid control. Will modify as needed based on labwork results.  

## 2022-10-01 NOTE — Assessment & Plan Note (Signed)
Starting pt. On Victoza, since his insurance says they will cover this.  I have instructed him on dosing, and provided AVS with instructions.

## 2022-10-01 NOTE — Assessment & Plan Note (Signed)
Refilled Fluconazole for pt.  Will reassess at follow up and may discuss preventatives.

## 2022-10-01 NOTE — Assessment & Plan Note (Signed)
Blood pressure well controlled with current medications.  Continue current therapy.  Will reassess at follow up.  

## 2022-10-01 NOTE — Assessment & Plan Note (Signed)
Patient stable.  Well controlled with current therapy.   Continue current meds.  

## 2022-10-03 LAB — CMP14+EGFR
ALT: 22 IU/L (ref 0–44)
AST: 17 IU/L (ref 0–40)
Albumin: 5.5 g/dL — ABNORMAL HIGH (ref 3.8–4.9)
Alkaline Phosphatase: 130 IU/L — ABNORMAL HIGH (ref 44–121)
BUN/Creatinine Ratio: 17 (ref 9–20)
BUN: 19 mg/dL (ref 6–24)
Bilirubin Total: 0.5 mg/dL (ref 0.0–1.2)
CO2: 21 mmol/L (ref 20–29)
Calcium: 11.1 mg/dL — ABNORMAL HIGH (ref 8.7–10.2)
Chloride: 92 mmol/L — ABNORMAL LOW (ref 96–106)
Creatinine, Ser: 1.14 mg/dL (ref 0.76–1.27)
Globulin, Total: 2.9 g/dL (ref 1.5–4.5)
Glucose: 416 mg/dL — ABNORMAL HIGH (ref 70–99)
Potassium: 5.3 mmol/L — ABNORMAL HIGH (ref 3.5–5.2)
Sodium: 136 mmol/L (ref 134–144)
Total Protein: 8.4 g/dL (ref 6.0–8.5)
eGFR: 77 mL/min/{1.73_m2} (ref >59)

## 2022-10-03 LAB — CBC WITH DIFFERENTIAL
Basophils Absolute: 0.1 10*3/uL (ref 0.0–0.2)
Basos: 1 %
EOS (ABSOLUTE): 0.2 10*3/uL (ref 0.0–0.4)
Eos: 3 %
Hematocrit: 59.7 % — ABNORMAL HIGH (ref 37.5–51.0)
Hemoglobin: 19.4 g/dL — ABNORMAL HIGH (ref 13.0–17.7)
Immature Grans (Abs): 0 10*3/uL (ref 0.0–0.1)
Immature Granulocytes: 0 %
Lymphocytes Absolute: 1.7 10*3/uL (ref 0.7–3.1)
Lymphs: 27 %
MCH: 29 pg (ref 26.6–33.0)
MCHC: 32.5 g/dL (ref 31.5–35.7)
MCV: 89 fL (ref 79–97)
Monocytes Absolute: 0.5 10*3/uL (ref 0.1–0.9)
Monocytes: 8 %
Neutrophils Absolute: 3.8 10*3/uL (ref 1.4–7.0)
Neutrophils: 61 %
RBC: 6.69 x10E6/uL — ABNORMAL HIGH (ref 4.14–5.80)
RDW: 12.9 % (ref 11.6–15.4)
WBC: 6.2 10*3/uL (ref 3.4–10.8)

## 2022-10-03 LAB — HEMOGLOBIN A1C
Est. average glucose Bld gHb Est-mCnc: 321 mg/dL
Hgb A1c MFr Bld: 12.8 % — ABNORMAL HIGH (ref 4.8–5.6)

## 2022-10-03 LAB — VITAMIN D 25 HYDROXY (VIT D DEFICIENCY, FRACTURES): Vit D, 25-Hydroxy: 47.2 ng/mL (ref 30.0–100.0)

## 2022-10-03 LAB — LIPID PANEL
Chol/HDL Ratio: 4.2 ratio (ref 0.0–5.0)
Cholesterol, Total: 180 mg/dL (ref 100–199)
HDL: 43 mg/dL (ref >39)
LDL Chol Calc (NIH): 70 mg/dL (ref 0–99)
Triglycerides: 432 mg/dL — ABNORMAL HIGH (ref 0–149)
VLDL Cholesterol Cal: 67 mg/dL — ABNORMAL HIGH (ref 5–40)

## 2022-10-03 LAB — VITAMIN B12: Vitamin B-12: 666 pg/mL (ref 232–1245)

## 2022-10-08 ENCOUNTER — Encounter: Payer: Self-pay | Admitting: Family

## 2022-10-09 ENCOUNTER — Telehealth: Payer: Self-pay | Admitting: Family

## 2022-10-09 NOTE — Telephone Encounter (Signed)
Patient left VM that his medication requires a PA. I have not made it to this PA yet but advised him once approved I will notify him.

## 2022-10-26 ENCOUNTER — Other Ambulatory Visit: Payer: Self-pay | Admitting: Family

## 2022-10-26 ENCOUNTER — Other Ambulatory Visit: Payer: 59

## 2022-10-26 DIAGNOSIS — R799 Abnormal finding of blood chemistry, unspecified: Secondary | ICD-10-CM

## 2022-11-08 ENCOUNTER — Other Ambulatory Visit: Payer: Self-pay | Admitting: Medical Genetics

## 2022-11-08 ENCOUNTER — Other Ambulatory Visit: Payer: Self-pay | Admitting: Family

## 2022-11-08 DIAGNOSIS — Z006 Encounter for examination for normal comparison and control in clinical research program: Secondary | ICD-10-CM

## 2022-11-08 DIAGNOSIS — I10 Essential (primary) hypertension: Secondary | ICD-10-CM

## 2022-12-04 ENCOUNTER — Other Ambulatory Visit: Payer: Self-pay | Admitting: Family

## 2023-01-03 ENCOUNTER — Other Ambulatory Visit: Payer: Self-pay | Admitting: Family

## 2023-01-03 DIAGNOSIS — E782 Mixed hyperlipidemia: Secondary | ICD-10-CM

## 2023-01-15 ENCOUNTER — Other Ambulatory Visit: Payer: Self-pay | Admitting: Family

## 2023-01-15 DIAGNOSIS — F411 Generalized anxiety disorder: Secondary | ICD-10-CM

## 2023-02-06 ENCOUNTER — Other Ambulatory Visit: Payer: Self-pay | Admitting: Family

## 2023-02-07 ENCOUNTER — Telehealth: Payer: Self-pay

## 2023-02-07 NOTE — Telephone Encounter (Signed)
Patient called regarding his Trulicity RX. I don't see it on his med list. He says he is waiting for it to be filled. Please advise

## 2023-02-12 ENCOUNTER — Encounter: Payer: Self-pay | Admitting: Oncology

## 2023-02-18 ENCOUNTER — Ambulatory Visit (INDEPENDENT_AMBULATORY_CARE_PROVIDER_SITE_OTHER): Payer: 59 | Admitting: Family

## 2023-02-18 ENCOUNTER — Encounter: Payer: Self-pay | Admitting: Family

## 2023-02-18 VITALS — BP 118/80 | HR 99 | Ht 69.0 in | Wt 174.0 lb

## 2023-02-18 DIAGNOSIS — E1159 Type 2 diabetes mellitus with other circulatory complications: Secondary | ICD-10-CM

## 2023-02-18 DIAGNOSIS — F331 Major depressive disorder, recurrent, moderate: Secondary | ICD-10-CM | POA: Diagnosis not present

## 2023-02-18 DIAGNOSIS — I10 Essential (primary) hypertension: Secondary | ICD-10-CM | POA: Diagnosis not present

## 2023-02-18 DIAGNOSIS — D696 Thrombocytopenia, unspecified: Secondary | ICD-10-CM

## 2023-02-18 DIAGNOSIS — C9141 Hairy cell leukemia, in remission: Secondary | ICD-10-CM

## 2023-02-18 DIAGNOSIS — Z720 Tobacco use: Secondary | ICD-10-CM

## 2023-02-18 DIAGNOSIS — I152 Hypertension secondary to endocrine disorders: Secondary | ICD-10-CM

## 2023-02-18 DIAGNOSIS — D61818 Other pancytopenia: Secondary | ICD-10-CM

## 2023-02-18 DIAGNOSIS — Z23 Encounter for immunization: Secondary | ICD-10-CM

## 2023-02-18 DIAGNOSIS — E1165 Type 2 diabetes mellitus with hyperglycemia: Secondary | ICD-10-CM

## 2023-02-18 DIAGNOSIS — E782 Mixed hyperlipidemia: Secondary | ICD-10-CM | POA: Diagnosis not present

## 2023-02-18 DIAGNOSIS — D709 Neutropenia, unspecified: Secondary | ICD-10-CM

## 2023-02-18 DIAGNOSIS — E538 Deficiency of other specified B group vitamins: Secondary | ICD-10-CM | POA: Diagnosis not present

## 2023-02-18 DIAGNOSIS — E559 Vitamin D deficiency, unspecified: Secondary | ICD-10-CM

## 2023-02-18 LAB — GLUCOSE, POCT (MANUAL RESULT ENTRY): POC Glucose: 283 mg/dL — AB (ref 70–99)

## 2023-02-18 NOTE — Assessment & Plan Note (Signed)
Checking labs today.  Will continue supplements as needed.  

## 2023-02-18 NOTE — Assessment & Plan Note (Signed)
Patient is seen by Oncology, who manage this condition.  He is well controlled with current therapy.   Will defer to them for further changes to plan of care.

## 2023-02-18 NOTE — Assessment & Plan Note (Signed)
Patient stable.  Well controlled with current therapy.   Continue current meds.  

## 2023-02-18 NOTE — Assessment & Plan Note (Signed)
Checking labs today.  Continue current therapy for lipid control. Will modify as needed based on labwork results.  

## 2023-02-18 NOTE — Assessment & Plan Note (Signed)
Checking labs today. Will call pt. With results  Continue current diabetes POC, as patient has been well controlled on current regimen.  Will adjust meds if needed based on labs.  

## 2023-02-18 NOTE — Progress Notes (Signed)
Established Patient Office Visit  Subjective:  Patient ID: Roy Valentine, male    DOB: [birth year]  Age: 52 y.o. MRN: [id number]  Chief Complaint  Patient presents with   Follow-up    4 month   Patient is here today for his 3 months follow up.  He has been feeling fairly well since last appointment.   He does not have additional concerns to discuss today.  Labs are due today. He needs refills.   I have reviewed his active problem list, medication list, allergies, health maintenance, notes from last encounter, lab results for his appointment today.    No other concerns at this time.   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   Hypertension     Past Surgical History:  Procedure Laterality Date   COLON SURGERY     Patial Colectomy 2016   COLONOSCOPY WITH PROPOFOL N/A 12/29/2019   Procedure: COLONOSCOPY WITH PROPOFOL;  Surgeon: Regis Bill, MD;  Location: [facility] ENDOSCOPY;  Service: Endoscopy;  Laterality: N/A;    Social History   Socioeconomic History   Marital status: Single    Spouse name: Not on file   Number of children: Not on file   Years of education: Not on file   Highest education level: Not on file  Occupational History   Not on file  Tobacco Use   Smoking status: Former    Current packs/day: 0.00    Average packs/day: 1 pack/day for 30.0 years (30.0 ttl pk-yrs)    Types: Cigarettes    Start date: 9    Quit date: 2021    Years since quitting: 3.9   Smokeless tobacco: Never  Vaping Use   Vaping status: Never Used  Substance and Sexual Activity   Alcohol use: Never   Drug use: Not Currently    Comment: smoked marijuana in college   Sexual activity: Not on file  Other Topics Concern   Not on file  Social History Narrative   Not on file   Social Determinants of Health   Financial  Resource Strain: Not on file  Food Insecurity: Not on file  Transportation Needs: Not on file  Physical Activity: Not on file  Stress: Not on file  Social Connections: Not on file  Intimate Partner Violence: Not on file    Family History  Problem Relation Age of Onset   Thyroid disease Mother    Alzheimer's disease Mother     No Active Allergies  Review of Systems  All other systems reviewed and are negative.      Objective:   BP 118/80   Pulse 99   Ht 5\' 9"  (1.753 m)   Wt 174 lb (78.9 kg)   SpO2 96%   BMI 25.70 kg/m   Vitals:   02/18/23 1001  BP: 118/80  Pulse: 99  Height: 5\' 9"  (1.753 m)  Weight: 174 lb (78.9 kg)  SpO2: 96%  BMI (Calculated): 25.68    Physical Exam   Results for orders placed or performed in visit on 02/18/23  POCT Glucose (CBG)  Result Value Ref Range   POC Glucose 283 (A) 70 - 99 mg/dl    Recent Results (from the past 2160 hour(s))  POCT Glucose (CBG)     Status: Abnormal   Collection Time: 02/18/23 10:14 AM  Result Value Ref Range  POC Glucose 283 (A) 70 - 99 mg/dl       Assessment & Plan:   Problem List Items Addressed This Visit       Active Problems   Neutropenia [facility])    Patient is seen by Oncology, who manage this condition.  He is well controlled with current therapy.   Will defer to them for further changes to plan of care.      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  He is well controlled with current therapy.   Will defer to them for further changes to plan of care.       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  He is well controlled with current therapy.   Will defer to them for further changes to plan of care.       Mixed hyperlipidemia    Checking labs today.  Continue  current therapy for lipid control. Will modify as needed based on labwork results.       Relevant Orders   Lipid panel   CMP14+EGFR   CBC with Diff   Vitamin D deficiency, unspecified    Checking labs today.  Will continue supplements as needed.       Relevant Orders   VITAMIN D 25 Hydroxy (Vit-D Deficiency, Fractures)   CMP14+EGFR   CBC with Diff   Hypertension associated with type 2 diabetes mellitus (HCC)    Blood pressure well controlled with current medications.  Continue current therapy.  Will reassess at follow up.       Moderate episode of recurrent major depressive disorder (HCC)    Patient stable.  Well controlled with current therapy.   Continue current meds.       Type 2 diabetes mellitus with hyperglycemia, without long-term current use of insulin (HCC) - Primary    Checking labs today. Will call pt. With results  Continue current diabetes POC, as patient has been well controlled on current regimen.  Will adjust meds if needed based on labs.       Relevant Orders   POCT Glucose (CBG) (Completed)   Ambulatory referral to Ophthalmology   CMP14+EGFR   Hemoglobin A1c   CBC with Diff   Thrombocytopenia (HCC)    Patient is seen by Oncology, who manage this condition.  He is well controlled with current therapy.   Will defer to them for further changes to plan of care.       B12 deficiency due to diet    Checking labs today.  Will continue supplements as needed.       Relevant Orders   CMP14+EGFR   Vitamin B12   CBC with Diff   Other Visit Diagnoses     Flu vaccine need       Flu vaccine given in office today.  Patient encouraged to manage symptoms as needed with supportive measures.   Relevant Orders   Influenza, MDCK, trivalent, PF(Flucelvax egg-free) (Completed)       Return in about 3 months (around 05/19/2023) for F/U.   Total time spent: 30 minutes  Miki Kins, FNP  02/18/2023   This document may have been prepared by  [facility] Voice Recognition software and as such may include unintentional dictation  errors.

## 2023-02-18 NOTE — Assessment & Plan Note (Signed)
Blood pressure well controlled with current medications.  Continue current therapy.  Will reassess at follow up.  

## 2023-02-21 LAB — CBC WITH DIFFERENTIAL/PLATELET
Basophils Absolute: 0.1 10*3/uL (ref 0.0–0.2)
Basos: 1 %
EOS (ABSOLUTE): 0.1 10*3/uL (ref 0.0–0.4)
Eos: 2 %
Hematocrit: 56.6 % — ABNORMAL HIGH (ref 37.5–51.0)
Hemoglobin: 18.9 g/dL — ABNORMAL HIGH (ref 13.0–17.7)
Immature Grans (Abs): 0.1 10*3/uL (ref 0.0–0.1)
Immature Granulocytes: 1 %
Lymphocytes Absolute: 1.8 10*3/uL (ref 0.7–3.1)
Lymphs: 27 %
MCH: 29.8 pg (ref 26.6–33.0)
MCHC: 33.4 g/dL (ref 31.5–35.7)
MCV: 89 fL (ref 79–97)
Monocytes Absolute: 0.6 10*3/uL (ref 0.1–0.9)
Monocytes: 9 %
Neutrophils Absolute: 4.2 10*3/uL (ref 1.4–7.0)
Neutrophils: 60 %
Platelets: 197 10*3/uL (ref 150–450)
RBC: 6.35 x10E6/uL — ABNORMAL HIGH (ref 4.14–5.80)
RDW: 12.6 % (ref 11.6–15.4)
WBC: 6.9 10*3/uL (ref 3.4–10.8)

## 2023-02-21 LAB — CMP14+EGFR
ALT: 30 [IU]/L (ref 0–44)
AST: 26 [IU]/L (ref 0–40)
Albumin: 5 g/dL — ABNORMAL HIGH (ref 3.8–4.9)
Alkaline Phosphatase: 85 [IU]/L (ref 44–121)
BUN/Creatinine Ratio: 22 — ABNORMAL HIGH (ref 9–20)
BUN: 21 mg/dL (ref 6–24)
Bilirubin Total: <0.2 mg/dL (ref 0.0–1.2)
CO2: 18 mmol/L — ABNORMAL LOW (ref 20–29)
Calcium: 11 mg/dL — ABNORMAL HIGH (ref 8.7–10.2)
Chloride: 92 mmol/L — ABNORMAL LOW (ref 96–106)
Creatinine, Ser: 0.97 mg/dL (ref 0.76–1.27)
Globulin, Total: 2.7 g/dL (ref 1.5–4.5)
Glucose: 312 mg/dL — ABNORMAL HIGH (ref 70–99)
Potassium: 4.4 mmol/L (ref 3.5–5.2)
Sodium: 134 mmol/L (ref 134–144)
Total Protein: 7.7 g/dL (ref 6.0–8.5)
eGFR: 93 mL/min/{1.73_m2} (ref >59)

## 2023-02-21 LAB — LIPID PANEL
Chol/HDL Ratio: 5.2 {ratio} — ABNORMAL HIGH (ref 0.0–5.0)
Cholesterol, Total: 204 mg/dL — ABNORMAL HIGH (ref 100–199)
HDL: 39 mg/dL — ABNORMAL LOW (ref >39)
LDL Chol Calc (NIH): 71 mg/dL (ref 0–99)
Triglycerides: 609 mg/dL (ref 0–149)
VLDL Cholesterol Cal: 94 mg/dL — ABNORMAL HIGH (ref 5–40)

## 2023-02-21 LAB — HEMOGLOBIN A1C
Est. average glucose Bld gHb Est-mCnc: 280 mg/dL
Hgb A1c MFr Bld: 11.4 % — ABNORMAL HIGH (ref 4.8–5.6)

## 2023-02-21 LAB — VITAMIN D 25 HYDROXY (VIT D DEFICIENCY, FRACTURES): Vit D, 25-Hydroxy: 29.9 ng/mL — ABNORMAL LOW (ref 30.0–100.0)

## 2023-02-21 LAB — VITAMIN B12: Vitamin B-12: 484 pg/mL (ref 232–1245)

## 2023-02-26 ENCOUNTER — Ambulatory Visit: Payer: 59

## 2023-02-26 DIAGNOSIS — Z720 Tobacco use: Secondary | ICD-10-CM

## 2023-02-26 DIAGNOSIS — F17211 Nicotine dependence, cigarettes, in remission: Secondary | ICD-10-CM

## 2023-02-28 ENCOUNTER — Encounter: Payer: Self-pay | Admitting: Family

## 2023-02-28 MED ORDER — NIACIN ER (ANTIHYPERLIPIDEMIC) 500 MG PO TBCR: 500.0000 mg | EXTENDED_RELEASE_TABLET | Freq: Every day | ORAL | 3 refills | Status: DC

## 2023-03-01 NOTE — Progress Notes (Signed)
Patient notified

## 2023-03-18 ENCOUNTER — Other Ambulatory Visit: Payer: Self-pay | Admitting: Family

## 2023-03-18 DIAGNOSIS — I10 Essential (primary) hypertension: Secondary | ICD-10-CM

## 2023-04-15 ENCOUNTER — Telehealth: Payer: Self-pay | Admitting: Family

## 2023-04-15 MED ORDER — TRULICITY 4.5 MG/0.5ML ~~LOC~~ SOAJ: 4.5000 mg | SUBCUTANEOUS | 2 refills | Status: DC

## 2023-04-15 NOTE — Telephone Encounter (Signed)
Patient called in needing new PA for Trulicity 4.5 mg. [facility] [address].  [name] ID: [id number] [name]: [id number] PCN: [id number] Group: [id number]

## 2023-04-19 ENCOUNTER — Other Ambulatory Visit: Payer: Self-pay | Admitting: Family

## 2023-04-19 DIAGNOSIS — F411 Generalized anxiety disorder: Secondary | ICD-10-CM

## 2023-05-13 ENCOUNTER — Encounter: Payer: Self-pay | Admitting: Oncology

## 2023-05-20 ENCOUNTER — Ambulatory Visit: Payer: 59 | Admitting: Family

## 2023-05-23 ENCOUNTER — Other Ambulatory Visit: Payer: Self-pay | Admitting: Family

## 2023-05-23 DIAGNOSIS — I10 Essential (primary) hypertension: Secondary | ICD-10-CM

## 2023-05-30 ENCOUNTER — Other Ambulatory Visit: Payer: Self-pay | Admitting: Family

## 2023-05-30 DIAGNOSIS — F411 Generalized anxiety disorder: Secondary | ICD-10-CM

## 2023-06-10 ENCOUNTER — Ambulatory Visit: Admitting: Family

## 2023-06-11 ENCOUNTER — Other Ambulatory Visit: Payer: Self-pay | Admitting: Family

## 2023-06-11 DIAGNOSIS — E782 Mixed hyperlipidemia: Secondary | ICD-10-CM

## 2023-06-24 ENCOUNTER — Other Ambulatory Visit: Payer: Self-pay | Admitting: Family

## 2023-06-24 DIAGNOSIS — E782 Mixed hyperlipidemia: Secondary | ICD-10-CM

## 2023-06-25 ENCOUNTER — Ambulatory Visit: Admitting: Family

## 2023-06-25 ENCOUNTER — Encounter: Payer: Self-pay | Admitting: Family

## 2023-06-25 VITALS — BP 124/84 | HR 87 | Ht 69.0 in | Wt 164.4 lb

## 2023-06-25 DIAGNOSIS — E1159 Type 2 diabetes mellitus with other circulatory complications: Secondary | ICD-10-CM | POA: Diagnosis not present

## 2023-06-25 DIAGNOSIS — I152 Hypertension secondary to endocrine disorders: Secondary | ICD-10-CM

## 2023-06-25 DIAGNOSIS — F411 Generalized anxiety disorder: Secondary | ICD-10-CM

## 2023-06-25 DIAGNOSIS — E1165 Type 2 diabetes mellitus with hyperglycemia: Secondary | ICD-10-CM | POA: Diagnosis not present

## 2023-06-25 DIAGNOSIS — E782 Mixed hyperlipidemia: Secondary | ICD-10-CM | POA: Diagnosis not present

## 2023-06-25 DIAGNOSIS — R5383 Other fatigue: Secondary | ICD-10-CM

## 2023-06-25 DIAGNOSIS — E559 Vitamin D deficiency, unspecified: Secondary | ICD-10-CM | POA: Diagnosis not present

## 2023-06-25 DIAGNOSIS — E538 Deficiency of other specified B group vitamins: Secondary | ICD-10-CM

## 2023-06-25 DIAGNOSIS — F331 Major depressive disorder, recurrent, moderate: Secondary | ICD-10-CM

## 2023-06-25 LAB — POCT CBG (FASTING - GLUCOSE)-MANUAL ENTRY: Glucose Fasting, POC: 415 mg/dL — AB (ref 70–99)

## 2023-06-25 MED ORDER — ESCITALOPRAM OXALATE 5 MG PO TABS
5.0000 mg | ORAL_TABLET | Freq: Every day | ORAL | 3 refills | Status: DC
Start: 2023-06-25 — End: 2023-09-24

## 2023-06-25 MED ORDER — BUPROPION HCL ER (XL) 300 MG PO TB24: 300.0000 mg | ORAL_TABLET | Freq: Every day | ORAL | 3 refills | Status: AC

## 2023-06-25 MED ORDER — MOUNJARO 5 MG/0.5ML ~~LOC~~ SOAJ: 5.0000 mg | SUBCUTANEOUS | 3 refills | Status: DC

## 2023-06-25 NOTE — Assessment & Plan Note (Signed)
 Checking labs today.  Will continue supplements as needed.

## 2023-06-25 NOTE — Progress Notes (Signed)
 Established Patient Office Visit  Subjective:  Patient ID: Roy Valentine, male    DOB: 1970/03/25  Age: 53 y.o. MRN: [id number]  Chief Complaint  Patient presents with   Follow-up    3 month follow up    Patient is here today for his 3 months follow up.  He has been feeling fairly well since last appointment.   He does have additional concerns to discuss today.  Needs refills for a few of his meds that he says the pharmacy told him were refused.   Labs are due today. He needs refills.   I have reviewed his active problem list, medication list, allergies, health maintenance, notes from last encounter, lab results for his appointment today.      No other concerns at this time.   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   Hypertension     Past Surgical History:  Procedure Laterality Date   COLON SURGERY     Patial Colectomy 2016   COLONOSCOPY WITH PROPOFOL N/A 12/29/2019   Procedure: COLONOSCOPY WITH PROPOFOL;  Surgeon: Regis Bill, MD;  Location: [facility] ENDOSCOPY;  Service: Endoscopy;  Laterality: N/A;    Social History   Socioeconomic History   Marital status: Single    Spouse name: Not on file   Number of children: Not on file   Years of education: Not on file   Highest education level: Not on file  Occupational History   Not on file  Tobacco Use   Smoking status: Former    Current packs/day: 0.00    Average packs/day: 1 pack/day for 30.0 years (30.0 ttl pk-yrs)    Types: Cigarettes    Start date: 13    Quit date: 2021    Years since quitting: 4.2   Smokeless tobacco: Never  Vaping Use   Vaping status: Never Used  Substance and Sexual Activity   Alcohol use: Never   Drug use: Not Currently    Comment: smoked marijuana in college   Sexual activity: Not on file  Other Topics Concern   Not on  file  Social History Narrative   Not on file   Social Drivers of Health   Financial Resource Strain: Not on file  Food Insecurity: Not on file  Transportation Needs: Not on file  Physical Activity: Not on file  Stress: Not on file  Social Connections: Not on file  Intimate Partner Violence: Not on file    Family History  Problem Relation Age of Onset   Thyroid disease Mother    Alzheimer's disease Mother     No Known Allergies  Review of Systems  All other systems reviewed and are negative.      Objective:   BP 124/84   Pulse 87   Ht 5\' 9"  (1.753 m)   Wt 164 lb 6.4 oz (74.6 kg)   SpO2 95%   BMI 24.28 kg/m   Vitals:   06/25/23 0917  BP: 124/84  Pulse: 87  Height: 5\' 9"  (1.753 m)  Weight: 164 lb 6.4 oz (74.6 kg)  SpO2: 95%  BMI (Calculated): 24.27    Physical Exam Vitals and nursing note reviewed.  Constitutional:      Appearance: Normal appearance. He is normal weight.  Eyes:     Pupils: Pupils are equal, round, and reactive to light.  Cardiovascular:  Rate and Rhythm: Normal rate and regular rhythm.     Pulses: Normal pulses.     Heart sounds: Normal heart sounds.  Pulmonary:     Effort: Pulmonary effort is normal.     Breath sounds: Normal breath sounds.  Musculoskeletal:        General: Normal range of motion.  Neurological:     General: No focal deficit present.     Mental Status: He is alert and oriented to person, place, and time. Mental status is at baseline.  Psychiatric:        Mood and Affect: Mood normal.        Behavior: Behavior normal.        Thought Content: Thought content normal.        Judgment: Judgment normal.      Results for orders placed or performed in visit on 06/25/23  POCT CBG (Fasting - Glucose)  Result Value Ref Range   Glucose Fasting, POC 415 (A) 70 - 99 mg/dL    Recent Results (from the past 2160 hours)  POCT CBG (Fasting - Glucose)     Status: Abnormal   Collection Time: 06/25/23  9:22 AM  Result  Value Ref Range   Glucose Fasting, POC 415 (A) 70 - 99 mg/dL       Assessment & Plan:   Problem List Items Addressed This Visit       Cardiovascular and Mediastinum   Hypertension associated with type 2 diabetes mellitus (HCC)   Blood pressure well controlled with current medications.  Continue current therapy.  Will reassess at follow up.       Relevant Medications   tirzepatide (MOUNJARO) 5 MG/0.5ML Pen   Other Relevant Orders   CMP14+EGFR   CBC with Diff     Endocrine   Type 2 diabetes mellitus with hyperglycemia, without long-term current use of insulin (HCC) - Primary   Checking labs today. Will call pt. With results  Continue current diabetes POC, as patient has been well controlled on current regimen.  Will adjust meds if needed based on labs.        Relevant Medications   tirzepatide (MOUNJARO) 5 MG/0.5ML Pen   Other Relevant Orders   POCT CBG (Fasting - Glucose) (Completed)   Lipid panel   VITAMIN D 25 Hydroxy (Vit-D Deficiency, Fractures)   CMP14+EGFR   TSH   Hemoglobin A1c   Vitamin B12   CBC with Diff     Other   Mixed hyperlipidemia   Checking labs today.  Continue current therapy for lipid control. Will modify as needed based on labwork results.        Relevant Orders   Lipid panel   CMP14+EGFR   CBC with Diff   Vitamin D deficiency, unspecified   Checking labs today.  Will continue supplements as needed.        Relevant Orders   VITAMIN D 25 Hydroxy (Vit-D Deficiency, Fractures)   CMP14+EGFR   CBC with Diff   Moderate episode of recurrent major depressive disorder (HCC)   Patient stable.  Well controlled with current therapy.   Continue current meds.       Relevant Medications   escitalopram (LEXAPRO) 5 MG tablet   buPROPion (WELLBUTRIN XL) 300 MG 24 hr tablet   B12 deficiency due to diet   Checking labs today.  Will continue supplements as needed.        Relevant Orders   CMP14+EGFR   Vitamin B12   CBC with  Diff    Other Visit Diagnoses       Other fatigue       Relevant Orders   CMP14+EGFR   TSH   CBC with Diff     Generalized anxiety disorder       Relevant Medications   escitalopram (LEXAPRO) 5 MG tablet   buPROPion (WELLBUTRIN XL) 300 MG 24 hr tablet       Return in about 3 months (around 09/24/2023).   Total time spent: 20 minutes  Miki Kins, FNP  06/25/2023   This document may have been prepared by [facility] Voice Recognition software and as such may include unintentional dictation errors.

## 2023-06-25 NOTE — Assessment & Plan Note (Signed)
 Checking labs today.  Continue current therapy for lipid control. Will modify as needed based on labwork results.

## 2023-06-25 NOTE — Assessment & Plan Note (Signed)
 Blood pressure well controlled with current medications.  Continue current therapy.  Will reassess at follow up.

## 2023-06-25 NOTE — Assessment & Plan Note (Signed)
 Patient stable.  Well controlled with current therapy.   Continue current meds.

## 2023-06-25 NOTE — Assessment & Plan Note (Signed)
 Checking labs today. Will call pt. With results  Continue current diabetes POC, as patient has been well controlled on current regimen.  Will adjust meds if needed based on labs.

## 2023-06-26 LAB — CMP14+EGFR
ALT: 17 IU/L (ref 0–44)
AST: 15 IU/L (ref 0–40)
Albumin: 5.2 g/dL — ABNORMAL HIGH (ref 3.8–4.9)
Alkaline Phosphatase: 113 IU/L (ref 44–121)
BUN/Creatinine Ratio: 18 (ref 9–20)
BUN: 17 mg/dL (ref 6–24)
Bilirubin Total: 0.3 mg/dL (ref 0.0–1.2)
CO2: 22 mmol/L (ref 20–29)
Calcium: 10.7 mg/dL — ABNORMAL HIGH (ref 8.7–10.2)
Chloride: 94 mmol/L — ABNORMAL LOW (ref 96–106)
Creatinine, Ser: 0.96 mg/dL (ref 0.76–1.27)
Globulin, Total: 2.8 g/dL (ref 1.5–4.5)
Glucose: 421 mg/dL — ABNORMAL HIGH (ref 70–99)
Potassium: 4.6 mmol/L (ref 3.5–5.2)
Sodium: 136 mmol/L (ref 134–144)
Total Protein: 8 g/dL (ref 6.0–8.5)
eGFR: 94 mL/min/{1.73_m2} (ref >59)

## 2023-06-26 LAB — CBC WITH DIFFERENTIAL/PLATELET
Basophils Absolute: 0.1 10*3/uL (ref 0.0–0.2)
Basos: 1 %
EOS (ABSOLUTE): 0.6 10*3/uL — ABNORMAL HIGH (ref 0.0–0.4)
Eos: 8 %
Hematocrit: 55.4 % — ABNORMAL HIGH (ref 37.5–51.0)
Hemoglobin: 18.8 g/dL — ABNORMAL HIGH (ref 13.0–17.7)
Immature Grans (Abs): 0.1 10*3/uL (ref 0.0–0.1)
Immature Granulocytes: 1 %
Lymphocytes Absolute: 1.8 10*3/uL (ref 0.7–3.1)
Lymphs: 27 %
MCH: 30.2 pg (ref 26.6–33.0)
MCHC: 33.9 g/dL (ref 31.5–35.7)
MCV: 89 fL (ref 79–97)
Monocytes Absolute: 0.6 10*3/uL (ref 0.1–0.9)
Monocytes: 9 %
Neutrophils Absolute: 3.6 10*3/uL (ref 1.4–7.0)
Neutrophils: 54 %
Platelets: 228 10*3/uL (ref 150–450)
RBC: 6.23 x10E6/uL — ABNORMAL HIGH (ref 4.14–5.80)
RDW: 12.9 % (ref 11.6–15.4)
WBC: 6.7 10*3/uL (ref 3.4–10.8)

## 2023-06-26 LAB — LIPID PANEL
Chol/HDL Ratio: 3.7 ratio (ref 0.0–5.0)
Cholesterol, Total: 172 mg/dL (ref 100–199)
HDL: 47 mg/dL (ref >39)
LDL Chol Calc (NIH): 69 mg/dL (ref 0–99)
Triglycerides: 353 mg/dL — ABNORMAL HIGH (ref 0–149)
VLDL Cholesterol Cal: 56 mg/dL — ABNORMAL HIGH (ref 5–40)

## 2023-06-26 LAB — VITAMIN B12: Vitamin B-12: 459 pg/mL (ref 232–1245)

## 2023-06-26 LAB — TSH: TSH: 1.45 u[IU]/mL (ref 0.450–4.500)

## 2023-06-26 LAB — VITAMIN D 25 HYDROXY (VIT D DEFICIENCY, FRACTURES): Vit D, 25-Hydroxy: 38.7 ng/mL (ref 30.0–100.0)

## 2023-06-26 LAB — HEMOGLOBIN A1C
Est. average glucose Bld gHb Est-mCnc: 361 mg/dL
Hgb A1c MFr Bld: 14.2 % — ABNORMAL HIGH (ref 4.8–5.6)

## 2023-06-30 ENCOUNTER — Other Ambulatory Visit: Payer: Self-pay | Admitting: Family

## 2023-07-04 NOTE — Progress Notes (Signed)
 Patient notified

## 2023-09-11 ENCOUNTER — Other Ambulatory Visit: Payer: Self-pay | Admitting: Family

## 2023-09-11 DIAGNOSIS — I10 Essential (primary) hypertension: Secondary | ICD-10-CM

## 2023-09-24 ENCOUNTER — Ambulatory Visit: Admitting: Family

## 2023-09-24 ENCOUNTER — Encounter: Payer: Self-pay | Admitting: Family

## 2023-09-24 VITALS — BP 120/70 | HR 86 | Ht 69.0 in | Wt 160.6 lb

## 2023-09-24 DIAGNOSIS — E559 Vitamin D deficiency, unspecified: Secondary | ICD-10-CM

## 2023-09-24 DIAGNOSIS — I152 Hypertension secondary to endocrine disorders: Secondary | ICD-10-CM

## 2023-09-24 DIAGNOSIS — R5383 Other fatigue: Secondary | ICD-10-CM | POA: Diagnosis not present

## 2023-09-24 DIAGNOSIS — B360 Pityriasis versicolor: Secondary | ICD-10-CM

## 2023-09-24 DIAGNOSIS — E1165 Type 2 diabetes mellitus with hyperglycemia: Secondary | ICD-10-CM

## 2023-09-24 DIAGNOSIS — E538 Deficiency of other specified B group vitamins: Secondary | ICD-10-CM

## 2023-09-24 DIAGNOSIS — E782 Mixed hyperlipidemia: Secondary | ICD-10-CM

## 2023-09-24 DIAGNOSIS — E1159 Type 2 diabetes mellitus with other circulatory complications: Secondary | ICD-10-CM

## 2023-09-24 LAB — POCT CBG (FASTING - GLUCOSE)-MANUAL ENTRY: Glucose Fasting, POC: 305 mg/dL — AB (ref 70–99)

## 2023-09-24 MED ORDER — KETOCONAZOLE 200 MG PO TABS: 200.0000 mg | ORAL_TABLET | Freq: Every day | ORAL | 1 refills | Status: AC

## 2023-09-24 NOTE — Assessment & Plan Note (Signed)
 Blood pressure well controlled with current medications.  Continue current therapy.  Will reassess at follow up.   - CBC w/Diff - CMP w/eGFR

## 2023-09-24 NOTE — Assessment & Plan Note (Signed)
 Checking labs today.  Will continue supplements as needed.   - Vitamin D  - Vitamin B12 - TSH

## 2023-09-24 NOTE — Progress Notes (Signed)
 Established Patient Office Visit  Subjective:  Patient ID: Roy Valentine, male    DOB: 12-08-1970  Age: 53 y.o. MRN: [id number]  Chief Complaint  Patient presents with   Follow-up    3 month follow up    Patient is here today for his 3 months follow up.  He has been feeling fairly well since last appointment.   He does not have additional concerns to discuss today.  Labs are due today. He needs refills.   I have reviewed his active problem list, medication list, allergies, health maintenance, notes from last encounter, lab results for his appointment today.      No other concerns at this time.   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   Hypertension     Past Surgical History:  Procedure Laterality Date   COLON SURGERY     Patial Colectomy 2016   COLONOSCOPY WITH PROPOFOL  N/A 12/29/2019   Procedure: COLONOSCOPY WITH PROPOFOL ;  Surgeon: Maryruth Ole DASEN, MD;  Location: [facility];  Service: Endoscopy;  Laterality: N/A;    Social History   Socioeconomic History   Marital status: Single    Spouse name: Not on file   Number of children: Not on file   Years of education: Not on file   Highest education level: Not on file  Occupational History   Not on file  Tobacco Use   Smoking status: Former    Current packs/day: 0.00    Average packs/day: 1 pack/day for 30.0 years (30.0 ttl pk-yrs)    Types: Cigarettes    Start date: 67    Quit date: 2021    Years since quitting: 4.5   Smokeless tobacco: Never  Vaping Use   Vaping status: Never Used  Substance and Sexual Activity   Alcohol use: Never   Drug use: Not Currently    Comment: smoked marijuana in college   Sexual activity: Not on file  Other Topics Concern   Not on file  Social History Narrative   Not on file   Social Drivers of Health    Financial Resource Strain: Not on file  Food Insecurity: Not on file  Transportation Needs: Not on file  Physical Activity: Not on file  Stress: Not on file  Social Connections: Not on file  Intimate Partner Violence: Not on file    Family History  Problem Relation Age of Onset   Thyroid  disease Mother    Alzheimer's disease Mother     No Known Allergies  Review of Systems  All other systems reviewed and are negative.      Objective:   BP 120/70   Pulse 86   Ht 5' 9 (1.753 m)   Wt 160 lb 9.6 oz (72.8 kg)   SpO2 97%   BMI 23.72 kg/m   Vitals:   09/24/23 0913  BP: 120/70  Pulse: 86  Height: 5' 9 (1.753 m)  Weight: 160 lb 9.6 oz (72.8 kg)  SpO2: 97%  BMI (Calculated): 23.71    Physical Exam Vitals and nursing note reviewed.  Constitutional:      Appearance: Normal appearance. He is normal weight.  Eyes:     Pupils: Pupils are equal, round, and reactive to light.  Cardiovascular:     Rate and Rhythm: Normal rate and regular rhythm.     Pulses: Normal pulses.  Heart sounds: Normal heart sounds.  Pulmonary:     Effort: Pulmonary effort is normal.     Breath sounds: Normal breath sounds.  Neurological:     Mental Status: He is alert.  Psychiatric:        Mood and Affect: Mood normal.        Behavior: Behavior normal.      Results for orders placed or performed in visit on 09/24/23  POCT CBG (Fasting - Glucose)  Result Value Ref Range   Glucose Fasting, POC 305 (A) 70 - 99 mg/dL    Recent Results (from the past 2160 hours)  POCT CBG (Fasting - Glucose)     Status: Abnormal   Collection Time: 09/24/23  9:18 AM  Result Value Ref Range   Glucose Fasting, POC 305 (A) 70 - 99 mg/dL       Assessment & Plan Type 2 diabetes mellitus with hyperglycemia, without long-term current use of insulin (HCC) Checking labs today. Will call pt. With results  Continue current diabetes POC, will increase Mounjaro  based on A1C. If significant improvement,  will increase to 10 mg and hold there.  If not, will continue and increase to 15.  Will adjust meds if needed based on labs.   -CBC w/Diff -CMP w/eGFR -Hemoglobin A1C  Vitamin D  deficiency, unspecified B12 deficiency due to diet Other fatigue Checking labs today.  Will continue supplements as needed.   - Vitamin D  - Vitamin B12 - TSH  Mixed hyperlipidemia Checking labs today.  Continue current therapy for lipid control. Will modify as needed based on labwork results.   -CMP w/eGFR -Lipid Panel  Hypertension associated with type 2 diabetes mellitus (HCC) Blood pressure well controlled with current medications.  Continue current therapy.  Will reassess at follow up.   - CBC w/Diff - CMP w/eGFR  Pityriasis versicolor Sending pt. RX for ketoconazole .  This has been the only effective medication for him in the past.  Will reassess at follow up.     Return in about 3 months (around 12/25/2023).   Total time spent: 20 minutes  ALAN CHRISTELLA ARRANT, FNP  09/24/2023   This document may have been prepared by [facility] Voice Recognition software and as such may include unintentional dictation errors.

## 2023-09-24 NOTE — Assessment & Plan Note (Signed)
 Checking labs today.  Continue current therapy for lipid control. Will modify as needed based on labwork results.   -CMP w/eGFR -Lipid Panel

## 2023-09-24 NOTE — Assessment & Plan Note (Signed)
 Checking labs today. Will call pt. With results  Continue current diabetes POC, will increase Mounjaro  based on A1C. If significant improvement, will increase to 10 mg and hold there.  If not, will continue and increase to 15.  Will adjust meds if needed based on labs.   -CBC w/Diff -CMP w/eGFR -Hemoglobin A1C

## 2023-09-25 LAB — CBC WITH DIFFERENTIAL/PLATELET
Basophils Absolute: 0.1 x10E3/uL (ref 0.0–0.2)
Basos: 1 %
EOS (ABSOLUTE): 0.2 x10E3/uL (ref 0.0–0.4)
Eos: 3 %
Hematocrit: 51.4 % — ABNORMAL HIGH (ref 37.5–51.0)
Hemoglobin: 17.3 g/dL (ref 13.0–17.7)
Immature Grans (Abs): 0.1 x10E3/uL (ref 0.0–0.1)
Immature Granulocytes: 1 %
Lymphocytes Absolute: 1.9 x10E3/uL (ref 0.7–3.1)
Lymphs: 33 %
MCH: 29.7 pg (ref 26.6–33.0)
MCHC: 33.7 g/dL (ref 31.5–35.7)
MCV: 88 fL (ref 79–97)
Monocytes Absolute: 0.5 x10E3/uL (ref 0.1–0.9)
Monocytes: 8 %
Neutrophils Absolute: 3.1 x10E3/uL (ref 1.4–7.0)
Neutrophils: 54 %
Platelets: 212 x10E3/uL (ref 150–450)
RBC: 5.82 x10E6/uL — ABNORMAL HIGH (ref 4.14–5.80)
RDW: 13.3 % (ref 11.6–15.4)
WBC: 5.7 x10E3/uL (ref 3.4–10.8)

## 2023-09-25 LAB — CMP14+EGFR
ALT: 14 IU/L (ref 0–44)
AST: 12 IU/L (ref 0–40)
Albumin: 4.6 g/dL (ref 3.8–4.9)
Alkaline Phosphatase: 104 IU/L (ref 44–121)
BUN/Creatinine Ratio: 14 (ref 9–20)
BUN: 13 mg/dL (ref 6–24)
Bilirubin Total: 0.4 mg/dL (ref 0.0–1.2)
CO2: 19 mmol/L — ABNORMAL LOW (ref 20–29)
Calcium: 10 mg/dL (ref 8.7–10.2)
Chloride: 96 mmol/L (ref 96–106)
Creatinine, Ser: 0.96 mg/dL (ref 0.76–1.27)
Globulin, Total: 2.7 g/dL (ref 1.5–4.5)
Glucose: 309 mg/dL — ABNORMAL HIGH (ref 70–99)
Potassium: 3.9 mmol/L (ref 3.5–5.2)
Sodium: 134 mmol/L (ref 134–144)
Total Protein: 7.3 g/dL (ref 6.0–8.5)
eGFR: 94 mL/min/1.73 (ref >59)

## 2023-09-25 LAB — LIPID PANEL
Chol/HDL Ratio: 3.2 ratio (ref 0.0–5.0)
Cholesterol, Total: 126 mg/dL (ref 100–199)
HDL: 40 mg/dL (ref >39)
LDL Chol Calc (NIH): 47 mg/dL (ref 0–99)
Triglycerides: 246 mg/dL — ABNORMAL HIGH (ref 0–149)
VLDL Cholesterol Cal: 39 mg/dL (ref 5–40)

## 2023-09-25 LAB — VITAMIN D 25 HYDROXY (VIT D DEFICIENCY, FRACTURES): Vit D, 25-Hydroxy: 37.9 ng/mL (ref 30.0–100.0)

## 2023-09-25 LAB — HEMOGLOBIN A1C
Est. average glucose Bld gHb Est-mCnc: 349 mg/dL
Hgb A1c MFr Bld: 13.8 % — ABNORMAL HIGH (ref 4.8–5.6)

## 2023-09-25 LAB — TSH: TSH: 0.865 u[IU]/mL (ref 0.450–4.500)

## 2023-09-25 LAB — VITAMIN B12: Vitamin B-12: 434 pg/mL (ref 232–1245)

## 2023-09-26 ENCOUNTER — Other Ambulatory Visit: Payer: Self-pay | Admitting: Family

## 2023-10-17 ENCOUNTER — Other Ambulatory Visit: Payer: Self-pay

## 2023-10-17 ENCOUNTER — Ambulatory Visit: Payer: Self-pay

## 2023-10-17 MED ORDER — MOUNJARO 7.5 MG/0.5ML ~~LOC~~ SOAJ: 7.5000 mg | SUBCUTANEOUS | 1 refills | Status: AC

## 2023-11-03 ENCOUNTER — Other Ambulatory Visit: Payer: Self-pay | Admitting: Family

## 2023-11-23 ENCOUNTER — Other Ambulatory Visit: Payer: Self-pay | Admitting: Family

## 2023-11-23 DIAGNOSIS — I10 Essential (primary) hypertension: Secondary | ICD-10-CM

## 2023-12-19 ENCOUNTER — Other Ambulatory Visit: Payer: Self-pay | Admitting: Family

## 2023-12-19 DIAGNOSIS — E782 Mixed hyperlipidemia: Secondary | ICD-10-CM

## 2023-12-25 ENCOUNTER — Encounter: Payer: Self-pay | Admitting: Family

## 2023-12-25 ENCOUNTER — Ambulatory Visit (INDEPENDENT_AMBULATORY_CARE_PROVIDER_SITE_OTHER): Admitting: Family

## 2023-12-25 VITALS — BP 130/88 | HR 95 | Ht 69.0 in | Wt 163.2 lb

## 2023-12-25 DIAGNOSIS — E1159 Type 2 diabetes mellitus with other circulatory complications: Secondary | ICD-10-CM

## 2023-12-25 DIAGNOSIS — C9141 Hairy cell leukemia, in remission: Secondary | ICD-10-CM

## 2023-12-25 DIAGNOSIS — E1169 Type 2 diabetes mellitus with other specified complication: Secondary | ICD-10-CM | POA: Diagnosis not present

## 2023-12-25 DIAGNOSIS — E538 Deficiency of other specified B group vitamins: Secondary | ICD-10-CM

## 2023-12-25 DIAGNOSIS — E559 Vitamin D deficiency, unspecified: Secondary | ICD-10-CM

## 2023-12-25 DIAGNOSIS — E1165 Type 2 diabetes mellitus with hyperglycemia: Secondary | ICD-10-CM

## 2023-12-25 DIAGNOSIS — E782 Mixed hyperlipidemia: Secondary | ICD-10-CM | POA: Diagnosis not present

## 2023-12-25 DIAGNOSIS — R5383 Other fatigue: Secondary | ICD-10-CM | POA: Insufficient documentation

## 2023-12-25 DIAGNOSIS — I152 Hypertension secondary to endocrine disorders: Secondary | ICD-10-CM

## 2023-12-25 LAB — POC CREATINE & ALBUMIN,URINE
Creatinine, POC: 100 mg/dL
Microalbumin Ur, POC: 80 mg/L

## 2023-12-25 NOTE — Assessment & Plan Note (Addendum)
-   Labs due today will FU with patient on results - Reinforced healthy diet and exercise - Referral for diabetic eye exam sent. Patient provided contact information to Opthamologist

## 2023-12-25 NOTE — Progress Notes (Signed)
 Established Patient Office Visit  Subjective:  Patient ID: Roy Valentine, male    DOB: Jan 02, 1971  Age: 53 y.o. MRN: [id number]  Chief Complaint  Patient presents with   Follow-up    3 month follow up    Patient is here today for his 3 months follow up.  He has been feeling fairly well since last appointment.   He does have additional concerns to discuss today. Blood sugars are running high at home while fasting. His HbgA1c is currently 13.8%. Patient is due for his diabetic eye exam. Referral will be sent to get that scheduled. Will also give patient their contact information to reach out if needed.   Patients blood pressure is elevated today but he has not taken his medication yet as he is fasting for his blood work that is due.  Patient also endorses increasing day time fatigue and has not used his CPAP in a long time. But patient does not want to do another sleep study at this time. Labs are due today and check urine albumin creatine ratio.  He needs refills.   I have reviewed his active problem list, medication list, allergies, family history, social history, health maintenance, notes from last encounter, lab results for his appointment today.    Patient declines flu shot at this time.    No other concerns at this time.   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   Hypertension     Past Surgical History:  Procedure Laterality Date   COLON SURGERY     Patial Colectomy 2016   COLONOSCOPY WITH PROPOFOL  N/A 12/29/2019   Procedure: COLONOSCOPY WITH PROPOFOL ;  Surgeon: Maryruth Ole DASEN, MD;  Location: [facility];  Service: Endoscopy;  Laterality: N/A;    Social History   Socioeconomic History   Marital status: Single    Spouse name: Not on file   Number of children: Not on file   Years of education: Not on  file   Highest education level: Not on file  Occupational History   Not on file  Tobacco Use   Smoking status: Former    Current packs/day: 0.00    Average packs/day: 1 pack/day for 30.0 years (30.0 ttl pk-yrs)    Types: Cigarettes    Start date: 63    Quit date: 2021    Years since quitting: 4.7   Smokeless tobacco: Never  Vaping Use   Vaping status: Never Used  Substance and Sexual Activity   Alcohol use: Never   Drug use: Not Currently    Comment: smoked marijuana in college   Sexual activity: Not on file  Other Topics Concern   Not on file  Social History Narrative   Not on file   Social Drivers of Health   Financial Resource Strain: Not on file  Food Insecurity: Not on file  Transportation Needs: Not on file  Physical Activity: Not on file  Stress: Not on file  Social Connections: Not on file  Intimate Partner Violence: Not on file    Family History  Problem Relation Age of Onset   Thyroid  disease Mother    Alzheimer's disease Mother     No Known Allergies  Review of Systems  Constitutional:  Positive for malaise/fatigue.  HENT: Negative.    Eyes:  Negative for blurred vision and pain.  Respiratory:  Negative  for cough and shortness of breath.   Cardiovascular:  Negative for chest pain, palpitations, claudication and leg swelling.  Gastrointestinal:  Negative for abdominal pain, blood in stool, constipation, diarrhea, nausea and vomiting.  Genitourinary:  Negative for dysuria, frequency and urgency.  Musculoskeletal: Negative.   Skin: Negative.   Neurological:  Negative for dizziness, tingling, sensory change and headaches.  Endo/Heme/Allergies: Negative.   Psychiatric/Behavioral: Negative.         Objective:   BP 130/88   Pulse 95   Ht 5' 9 (1.753 m)   Wt 163 lb 3.2 oz (74 kg)   SpO2 97%   BMI 24.10 kg/m   Vitals:   12/25/23 0902  BP: 130/88  Pulse: 95  Height: 5' 9 (1.753 m)  Weight: 163 lb 3.2 oz (74 kg)  SpO2: 97%  BMI  (Calculated): 24.09    Physical Exam Vitals and nursing note reviewed.  Constitutional:      Appearance: Normal appearance.  HENT:     Head: Normocephalic.  Eyes:     Extraocular Movements: Extraocular movements intact.     Pupils: Pupils are equal, round, and reactive to light.  Cardiovascular:     Rate and Rhythm: Normal rate and regular rhythm.     Pulses: Normal pulses.     Heart sounds: Normal heart sounds. No murmur heard. Pulmonary:     Effort: Pulmonary effort is normal. No respiratory distress.     Breath sounds: Normal breath sounds.  Abdominal:     General: There is no distension.     Tenderness: There is no abdominal tenderness.  Musculoskeletal:        General: No tenderness. Normal range of motion.     Cervical back: Normal range of motion and neck supple.     Right lower leg: No edema.     Left lower leg: No edema.  Skin:    General: Skin is warm and dry.     Coloration: Skin is not jaundiced.     Findings: No erythema.  Neurological:     General: No focal deficit present.     Mental Status: He is alert and oriented to person, place, and time.  Psychiatric:        Mood and Affect: Mood normal.        Speech: Speech normal.        Behavior: Behavior is cooperative.        Cognition and Memory: Memory is not impaired.      No results found for any visits on 12/25/23.  No results found for this or any previous visit (from the past 2160 hours).     Assessment & Plan Type 2 diabetes mellitus with hyperglycemia, without long-term current use of insulin (HCC) Hypertension associated with type 2 diabetes mellitus (HCC) Combined hyperlipidemia associated with type 2 diabetes mellitus (HCC) - Labs due today will FU with patient on results - Reinforced healthy diet and exercise - Referral for diabetic eye exam sent. Patient provided contact information to Opthamologist Vitamin D  deficiency, unspecified B12 deficiency due to diet Other fatigue - Labs due  today will FU with patient on results - Discussed untreated sleep apnea without CPAP use. Patient declines wanting to repeat sleep study or use CPAP at this time Hairy cell leukemia, in remission (HCC) - In remission - Labs due today - Patient follows Hematology as recommended    Return in about 3 months (around 03/26/2024).   Total time spent: 25 minutes  Itzell Bendavid A  Adine, FNP  12/25/2023   This document may have been prepared by [facility] Voice Recognition software and as such may include unintentional dictation errors.

## 2023-12-25 NOTE — Assessment & Plan Note (Addendum)
-   Labs due today will FU with patient on results - Discussed untreated sleep apnea without CPAP use. Patient declines wanting to repeat sleep study or use CPAP at this time

## 2023-12-25 NOTE — Assessment & Plan Note (Addendum)
-   In remission - Labs due today - Patient follows Hematology as recommended

## 2023-12-25 NOTE — Patient Instructions (Addendum)
 [facility] [address], [place], [place] [address] [phone]

## 2023-12-26 LAB — CBC WITH DIFFERENTIAL/PLATELET
Basophils Absolute: 0.1 x10E3/uL (ref 0.0–0.2)
Basos: 1 %
EOS (ABSOLUTE): 0.2 x10E3/uL (ref 0.0–0.4)
Eos: 3 %
Hematocrit: 56.9 % — ABNORMAL HIGH (ref 37.5–51.0)
Hemoglobin: 18.7 g/dL — ABNORMAL HIGH (ref 13.0–17.7)
Immature Grans (Abs): 0 x10E3/uL (ref 0.0–0.1)
Immature Granulocytes: 0 %
Lymphocytes Absolute: 2.1 x10E3/uL (ref 0.7–3.1)
Lymphs: 32 %
MCH: 29.8 pg (ref 26.6–33.0)
MCHC: 32.9 g/dL (ref 31.5–35.7)
MCV: 91 fL (ref 79–97)
Monocytes Absolute: 0.6 x10E3/uL (ref 0.1–0.9)
Monocytes: 10 %
Neutrophils Absolute: 3.4 x10E3/uL (ref 1.4–7.0)
Neutrophils: 53 %
Platelets: 224 x10E3/uL (ref 150–450)
RBC: 6.27 x10E6/uL — ABNORMAL HIGH (ref 4.14–5.80)
RDW: 12.9 % (ref 11.6–15.4)
WBC: 6.5 x10E3/uL (ref 3.4–10.8)

## 2023-12-26 LAB — VITAMIN D 25 HYDROXY (VIT D DEFICIENCY, FRACTURES): Vit D, 25-Hydroxy: 35.6 ng/mL (ref 30.0–100.0)

## 2023-12-26 LAB — CMP14+EGFR
ALT: 18 IU/L (ref 0–44)
AST: 13 IU/L (ref 0–40)
Albumin: 5.2 g/dL — ABNORMAL HIGH (ref 3.8–4.9)
Alkaline Phosphatase: 84 IU/L (ref 47–123)
BUN/Creatinine Ratio: 23 — ABNORMAL HIGH (ref 9–20)
BUN: 23 mg/dL (ref 6–24)
Bilirubin Total: 0.4 mg/dL (ref 0.0–1.2)
CO2: 21 mmol/L (ref 20–29)
Calcium: 10.7 mg/dL — ABNORMAL HIGH (ref 8.7–10.2)
Chloride: 95 mmol/L — ABNORMAL LOW (ref 96–106)
Creatinine, Ser: 1.02 mg/dL (ref 0.76–1.27)
Globulin, Total: 2.4 g/dL (ref 1.5–4.5)
Glucose: 255 mg/dL — ABNORMAL HIGH (ref 70–99)
Potassium: 4.1 mmol/L (ref 3.5–5.2)
Sodium: 136 mmol/L (ref 134–144)
Total Protein: 7.6 g/dL (ref 6.0–8.5)
eGFR: 87 mL/min/1.73 (ref >59)

## 2023-12-26 LAB — HEMOGLOBIN A1C
Est. average glucose Bld gHb Est-mCnc: 272 mg/dL
Hgb A1c MFr Bld: 11.1 % — ABNORMAL HIGH (ref 4.8–5.6)

## 2023-12-26 LAB — LIPID PANEL
Chol/HDL Ratio: 3.3 ratio (ref 0.0–5.0)
Cholesterol, Total: 140 mg/dL (ref 100–199)
HDL: 42 mg/dL (ref >39)
LDL Chol Calc (NIH): 62 mg/dL (ref 0–99)
Triglycerides: 219 mg/dL — ABNORMAL HIGH (ref 0–149)
VLDL Cholesterol Cal: 36 mg/dL (ref 5–40)

## 2023-12-26 LAB — VITAMIN B12: Vitamin B-12: 419 pg/mL (ref 232–1245)

## 2023-12-26 LAB — TSH: TSH: 1.47 u[IU]/mL (ref 0.450–4.500)

## 2023-12-27 ENCOUNTER — Other Ambulatory Visit: Payer: Self-pay | Admitting: Family

## 2023-12-27 MED ORDER — MOUNJARO 10 MG/0.5ML ~~LOC~~ SOAJ: 10.0000 mg | SUBCUTANEOUS | 2 refills | Status: AC

## 2024-01-03 ENCOUNTER — Ambulatory Visit: Payer: Self-pay

## 2024-01-08 ENCOUNTER — Encounter: Payer: Self-pay | Admitting: Family

## 2024-01-10 ENCOUNTER — Other Ambulatory Visit: Payer: Self-pay | Admitting: Medical Genetics

## 2024-01-10 DIAGNOSIS — Z006 Encounter for examination for normal comparison and control in clinical research program: Secondary | ICD-10-CM

## 2024-02-10 ENCOUNTER — Other Ambulatory Visit: Payer: Self-pay

## 2024-02-12 MED ORDER — NIACIN ER (ANTIHYPERLIPIDEMIC) 500 MG PO TBCR: 500.0000 mg | EXTENDED_RELEASE_TABLET | Freq: Every day | ORAL | 3 refills | Status: AC

## 2024-02-19 LAB — GENECONNECT MOLECULAR SCREEN: Genetic Analysis Overall Interpretation: NEGATIVE

## 2024-03-26 ENCOUNTER — Ambulatory Visit (INDEPENDENT_AMBULATORY_CARE_PROVIDER_SITE_OTHER): Admitting: Family

## 2024-03-26 VITALS — BP 122/80 | HR 85 | Ht 69.0 in | Wt 160.0 lb

## 2024-03-26 DIAGNOSIS — E559 Vitamin D deficiency, unspecified: Secondary | ICD-10-CM | POA: Diagnosis not present

## 2024-03-26 DIAGNOSIS — D61818 Other pancytopenia: Secondary | ICD-10-CM

## 2024-03-26 DIAGNOSIS — I152 Hypertension secondary to endocrine disorders: Secondary | ICD-10-CM

## 2024-03-26 DIAGNOSIS — E538 Deficiency of other specified B group vitamins: Secondary | ICD-10-CM

## 2024-03-26 DIAGNOSIS — E1169 Type 2 diabetes mellitus with other specified complication: Secondary | ICD-10-CM

## 2024-03-26 DIAGNOSIS — E1165 Type 2 diabetes mellitus with hyperglycemia: Secondary | ICD-10-CM

## 2024-03-26 DIAGNOSIS — E782 Mixed hyperlipidemia: Secondary | ICD-10-CM

## 2024-03-26 DIAGNOSIS — E1159 Type 2 diabetes mellitus with other circulatory complications: Secondary | ICD-10-CM | POA: Diagnosis not present

## 2024-03-26 DIAGNOSIS — R5383 Other fatigue: Secondary | ICD-10-CM

## 2024-03-26 MED ORDER — TADALAFIL 20 MG PO TABS: 20.0000 mg | ORAL_TABLET | Freq: Every day | ORAL | 0 refills | Status: AC | PRN

## 2024-03-26 NOTE — Progress Notes (Unsigned)
 "  Established Patient Office Visit  Subjective:  Patient ID: Roy Valentine, male    DOB: [birth year]  Age: 54 y.o. MRN: [id number]  Chief Complaint  Patient presents with   Follow-up    3 month    Patient is here today for his 3 months follow up.  He has been feeling fairly well since last appointment.   He does have additional concerns to discuss today.   Labs are due today.  He needs refills.   I have reviewed his active problem list, medication list, health maintenance, notes from last encounter, lab results for his appointment today.      No other concerns at this time.   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   Hypertension     Past Surgical History:  Procedure Laterality Date   COLON SURGERY     Patial Colectomy 2016   COLONOSCOPY WITH PROPOFOL  N/A 12/29/2019   Procedure: COLONOSCOPY WITH PROPOFOL ;  Surgeon: Maryruth Ole DASEN, MD;  Location: [facility] ENDOSCOPY;  Service: Endoscopy;  Laterality: N/A;    Social History   Socioeconomic History   Marital status: Single    Spouse name: Not on file   Number of children: Not on file   Years of education: Not on file   Highest education level: Not on file  Occupational History   Not on file  Tobacco Use   Smoking status: Former    Current packs/day: 0.00    Average packs/day: 1 pack/day for 30.0 years (30.0 ttl pk-yrs)    Types: Cigarettes    Start date: 28    Quit date: 2021    Years since quitting: 5.0   Smokeless tobacco: Never  Vaping Use   Vaping status: Never Used  Substance and Sexual Activity   Alcohol use: Never   Drug use: Not Currently    Comment: smoked marijuana in college   Sexual activity: Not on file  Other Topics Concern   Not on file  Social History Narrative   Not on file   Social Drivers of Health   Tobacco Use: Medium Risk  (12/25/2023)   Patient History    Smoking Tobacco Use: Former    Smokeless Tobacco Use: Never    Passive Exposure: Not on Actuary Strain: Not on file  Food Insecurity: Not on file  Transportation Needs: Not on file  Physical Activity: Not on file  Stress: Not on file  Social Connections: Not on file  Intimate Partner Violence: Not on file  Depression (PHQ2-9): Low Risk (09/24/2023)   Depression (PHQ2-9)    PHQ-2 Score: 4  Alcohol Screen: Not on file  Housing: Not on file  Utilities: Not on file  Health Literacy: Not on file    Family History  Problem Relation Age of Onset   Thyroid  disease Mother    Alzheimer's disease Mother     Allergies[1]  Review of Systems  All other systems reviewed and are negative.      Objective:   There were no vitals taken for this visit.  There were no vitals filed for this visit.  Physical Exam Vitals and nursing note reviewed.  Constitutional:      Appearance: Normal appearance. He is normal weight.  HENT:     Head: Normocephalic.     Right Ear: Tympanic membrane normal.  Left Ear: Tympanic membrane normal.  Eyes:     Extraocular Movements: Extraocular movements intact.     Conjunctiva/sclera: Conjunctivae normal.     Pupils: Pupils are equal, round, and reactive to light.  Cardiovascular:     Rate and Rhythm: Normal rate.     Pulses: Normal pulses.  Pulmonary:     Effort: Pulmonary effort is normal.     Breath sounds: Normal breath sounds.  Musculoskeletal:        General: Normal range of motion.  Neurological:     General: No focal deficit present.     Mental Status: He is alert and oriented to person, place, and time.  Psychiatric:        Mood and Affect: Mood normal.        Behavior: Behavior normal.        Thought Content: Thought content normal.        Judgment: Judgment normal.      No results found for any visits on 03/26/24.  Recent Results (from the past 2160 hours)  [facility]  Molecular Screen     Status: None   Collection Time: 02/08/24  6:16 PM  Result Value Ref Range   Genetic Analysis Overall Interpretation Negative    Genetic Disease Assessed      This is a screening test and does not detect all pathogenic or likely pathogenic variant(s) in the tested genes; diagnostic testing is recommended for individuals with a personal or family history of heart disease or hereditary cancer. [facility] Tier One  Population Screen is a screening test that analyzes 11 genes related to hereditary breast and ovarian cancer (HBOC) syndrome, Lynch syndrome, and familial hypercholesterolemia. This test only reports clinically significant pathogenic and likely  pathogenic variants but does not report variants of uncertain significance (VUS). In addition, analysis of the PMS2 gene excludes exons 11-15, which overlap with a known pseudogene (PMS2CL).    Genetic Analysis Report      No pathogenic or likely pathogenic variants were detected in the genes analyzed by this test.Genetic test results should be interpreted in the context of an individual's personal medical and family history. Alteration to medical management is NOT  recommended based solely on this result. Clinical correlation is advised.Additional Considerations- This is a screening test; individuals may still carry pathogenic or likely pathogenic variant(s) in the tested genes that are not detected by this test.-  For individuals at risk for these or other related conditions based on factors including personal or family history, diagnostic testing is recommended.- The absence of pathogenic or likely pathogenic variant(s) in the analyzed genes, while reassuring,  does not eliminate the possibility of a hereditary condition; there are other variants and genes associated with heart disease and hereditary cancer that are not included in this test.    Genes Tested See Notes     Comment: APOB, BRCA1, BRCA2, EPCAM, LDLR, LDLRAP1, PCSK9,  PMS2, MLH1, MSH2, MSH6   Disclaimer See Notes     Comment: This test was developed and validated by [facility] This test has not been cleared or approved by the [place]  Food and Drug Administration (FDA). The [facility] laboratory is accredited by the College of American Pathologists (CAP) and certified under  the Clinical Laboratory Improvement Amendments (CLIA #: [id number]) to perform high-complexity clinical tests. This test is used for clinical purposes. It should not be regarded as investigational use only or for research use only.    Sequencing Location See Notes  Comment: Sequencing done at [facility] [address], [address], [place] [address] (CLIA# [id number])   Interpretation Methods and Limitations See Notes     Comment: Extracted DNA is enriched for targeted regions and then sequenced using the [facility] Exome+ (R) assay on an Illumina DNA sequencing system. Data is then aligned to a modified version of GRCh38 and all genes are analyzed using the MANE transcript and MANE  Plus Clinical transcript, when available. Small variant calling is completed using a customized version of [employer]'s DNAseq software, augmented by a proprietary small variant caller for difficult variants. Copy number variants (CNVs) are then called  using a proprietary bioinformatics pipeline based on depth analysis with a comparison to similarly sequenced samples. Analysis of the PMS2 gene is limited to exons 1-10. Both the MSH2 Boland inversion (exons 1-7) and the BRCA2 Alu insertion are detected  by identifying discordant read-pairs spanning the breakpoints. The interpretation and reporting of variants in APOB, PCSK9, and LDLR is specific to familial hypercholesterolemia; variants associated with hypobetalipoproteinemia are not i ncluded.  Interpretation is based upon guidelines published by the [employer] and [employer]), the Association for Molecular Pathology (AMP) or  their modification by [employer] when available and/or review  of previous clinical assertions available in the [employer]. Interpretation is limited to the transcripts indicated on the report and +/- 10 bp into intronic regions, except as noted below. [facility] variant classifications include pathogenic, likely  pathogenic, variant of uncertain significance (VUS), likely benign, and benign. Only variants classified as pathogenic and likely pathogenic are included in the report. All reported variants are confirmed through secondary manual inspection of DNA  sequence data or orthogonal testing. Risk estimations and management guidelines included in this report are based on analysis of primary literature and recommendations of applicable professional societies, and should be regarded  as approximations.Based  on validation studies, this assay delivers > 99% sensitivity and specificity for single nucleotide variants and insertions and deletions (indels) up to 20 bp. Larger indels and complex variants are also reported but sensitivity may be reduced. Based on  validation studies, this assay delivers > 99% sensitivity to multi-exon CNVs and > 90% sensitivity to single-exon CNVs. This test may not detect variants in challenging regions (such as short tandem repeats, homopolymer runs, and segment duplications),  sub-exonic CNVs, chromosomal aneuploidy, or variants in the presence of mosaicism. Phasing will be attempted and reported, when possible. Structural rearrangements such as inversions, translocations, complex rearrangements, and gene conversions are not  tested in this assay unless explicitly indicated. Additionally, deep intronic, promoter, and enhancer regions may not be covered. It is important to note that this is a screening test and cannot detect all di sease-causing variants. A negative result does  not guarantee the absence of a rare, undetectable variant in the genes  analyzed; consider using a diagnostic test if there is significant personal and/or family history of one of the conditions analyzed by this test. Any potential incidental findings  outside of these genes and conditions will not be identified, nor reported. The results of a genetic test may be influenced by various factors, including bone marrow transplantation, blood transfusions, or in rare cases, hematolymphoid neoplasms.Gene  Specific Notes:APOB: analysis is limited to c.10580G>A and c.10579C>T; BRCA1: sequencing analysis extends to CDS +/-20 bp; BRCA2: analysis includes detection of [id number] and sequencing analysis extends to CDS +/-20 bp. EPCAM: analysis is limited  to CNV of exons 8-9; LDLR:  analysis includes CNV of the promoter; MLH1: analysis includes CNV of the promoter; MSH2: analysis includes detection of the Boland inversion (inversion of exons 1 -7) and detection of c.942+3A>T, PMS2: analysis is limited to  exons 1-10.Donnice JINNY Kemp, PhD, [email or web address]        Assessment & Plan Hypertension associated with type 2 diabetes mellitus (HCC) Blood pressure well controlled with current medications.  Continue current therapy.  Will reassess at follow up.   - CBC w/Diff - CMP w/eGFR  Other pancytopenia [facility]) Patient is seen by hematology, who manage this condition.  He is well controlled with current therapy.   Will defer to them for further changes to plan of care.  Type 2 diabetes mellitus with hyperglycemia, without long-term current use of insulin (HCC) Checking labs today. Will call pt. With results  Continue current diabetes POC, as patient has been well controlled on current regimen.  Will adjust meds if needed based on labs.   -CBC w/Diff -CMP w/eGFR -Hemoglobin A1C   B12 deficiency due to diet Vitamin D  deficiency, unspecified Other fatigue Checking labs today.  Will continue supplements as needed.   - Vitamin D  - Vitamin B12 -  TSH  Combined hyperlipidemia associated with type 2 diabetes mellitus (HCC) Checking labs today.  Continue current therapy for lipid control. Will modify as needed based on labwork results.   -CMP w/eGFR -Lipid Panel     Return in about 3 months (around 06/24/2024).   Total time spent: 20 minutes  ALAN CHRISTELLA ARRANT, FNP  03/26/2024   This document may have been prepared by [facility] Voice Recognition software and as such may include unintentional dictation errors.      [1] No Known Allergies  "

## 2024-03-27 LAB — CMP14+EGFR
ALT: 23 IU/L (ref 0–44)
AST: 14 IU/L (ref 0–40)
Albumin: 5.1 g/dL — ABNORMAL HIGH (ref 3.8–4.9)
Alkaline Phosphatase: 98 IU/L (ref 47–123)
BUN/Creatinine Ratio: 16 (ref 9–20)
BUN: 15 mg/dL (ref 6–24)
Bilirubin Total: 0.5 mg/dL (ref 0.0–1.2)
CO2: 23 mmol/L (ref 20–29)
Calcium: 10.7 mg/dL — ABNORMAL HIGH (ref 8.7–10.2)
Chloride: 98 mmol/L (ref 96–106)
Creatinine, Ser: 0.93 mg/dL (ref 0.76–1.27)
Globulin, Total: 2.7 g/dL (ref 1.5–4.5)
Glucose: 262 mg/dL — ABNORMAL HIGH (ref 70–99)
Potassium: 4.6 mmol/L (ref 3.5–5.2)
Sodium: 139 mmol/L (ref 134–144)
Total Protein: 7.8 g/dL (ref 6.0–8.5)
eGFR: 98 mL/min/1.73

## 2024-03-27 LAB — CBC WITH DIFFERENTIAL/PLATELET
Basophils Absolute: 0.1 x10E3/uL (ref 0.0–0.2)
Basos: 1 %
EOS (ABSOLUTE): 0.1 x10E3/uL (ref 0.0–0.4)
Eos: 2 %
Hematocrit: 55 % — ABNORMAL HIGH (ref 37.5–51.0)
Hemoglobin: 18.4 g/dL — ABNORMAL HIGH (ref 13.0–17.7)
Immature Grans (Abs): 0 x10E3/uL (ref 0.0–0.1)
Immature Granulocytes: 0 %
Lymphocytes Absolute: 1.8 x10E3/uL (ref 0.7–3.1)
Lymphs: 30 %
MCH: 29.7 pg (ref 26.6–33.0)
MCHC: 33.5 g/dL (ref 31.5–35.7)
MCV: 89 fL (ref 79–97)
Monocytes Absolute: 0.5 x10E3/uL (ref 0.1–0.9)
Monocytes: 9 %
Neutrophils Absolute: 3.3 x10E3/uL (ref 1.4–7.0)
Neutrophils: 57 %
Platelets: 246 x10E3/uL (ref 150–450)
RBC: 6.2 x10E6/uL — ABNORMAL HIGH (ref 4.14–5.80)
RDW: 12.9 % (ref 11.6–15.4)
WBC: 5.8 x10E3/uL (ref 3.4–10.8)

## 2024-03-27 LAB — LIPID PANEL
Chol/HDL Ratio: 2.7 ratio (ref 0.0–5.0)
Cholesterol, Total: 119 mg/dL (ref 100–199)
HDL: 44 mg/dL
LDL Chol Calc (NIH): 46 mg/dL (ref 0–99)
Triglycerides: 177 mg/dL — ABNORMAL HIGH (ref 0–149)
VLDL Cholesterol Cal: 29 mg/dL (ref 5–40)

## 2024-03-27 LAB — VITAMIN D 25 HYDROXY (VIT D DEFICIENCY, FRACTURES): Vit D, 25-Hydroxy: 29.3 ng/mL — ABNORMAL LOW (ref 30.0–100.0)

## 2024-03-27 LAB — TSH: TSH: 0.707 u[IU]/mL (ref 0.450–4.500)

## 2024-03-27 LAB — HEMOGLOBIN A1C
Est. average glucose Bld gHb Est-mCnc: 312 mg/dL
Hgb A1c MFr Bld: 12.5 % — ABNORMAL HIGH (ref 4.8–5.6)

## 2024-03-27 LAB — VITAMIN B12: Vitamin B-12: 424 pg/mL (ref 232–1245)

## 2024-03-29 ENCOUNTER — Encounter: Payer: Self-pay | Admitting: Family

## 2024-03-29 ENCOUNTER — Other Ambulatory Visit: Payer: Self-pay | Admitting: Family

## 2024-03-29 DIAGNOSIS — I10 Essential (primary) hypertension: Secondary | ICD-10-CM

## 2024-03-29 NOTE — Assessment & Plan Note (Signed)
 Blood pressure well controlled with current medications.  Continue current therapy.  Will reassess at follow up.   - CBC w/Diff - CMP w/eGFR

## 2024-03-29 NOTE — Assessment & Plan Note (Signed)
 Checking labs today.  Continue current therapy for lipid control. Will modify as needed based on labwork results.   -CMP w/eGFR -Lipid Panel

## 2024-03-29 NOTE — Assessment & Plan Note (Signed)
 Checking labs today.  Will continue supplements as needed.   - Vitamin D  - Vitamin B12 - TSH

## 2024-03-29 NOTE — Assessment & Plan Note (Signed)
 Patient is seen by hematology, who manage this condition.  He is well controlled with current therapy.   Will defer to them for further changes to plan of care.

## 2024-03-29 NOTE — Assessment & Plan Note (Signed)
 Checking labs today. Will call pt. With results  Continue current diabetes POC, as patient has been well controlled on current regimen.  Will adjust meds if needed based on labs.   -CBC w/Diff -CMP w/eGFR -Hemoglobin A1C

## 2024-04-02 ENCOUNTER — Ambulatory Visit: Payer: Self-pay

## 2024-06-24 ENCOUNTER — Ambulatory Visit: Admitting: Family
